# Patient Record
Sex: Female | Born: 1985
Health system: Southern US, Community
[De-identification: ages and names within clinical notes are randomized; demographics above are authoritative.]

## PROBLEM LIST (undated history)

## (undated) DIAGNOSIS — K219 Gastro-esophageal reflux disease without esophagitis: Secondary | ICD-10-CM

## (undated) DIAGNOSIS — S52501A Unspecified fracture of the lower end of right radius, initial encounter for closed fracture: Secondary | ICD-10-CM

## (undated) DIAGNOSIS — E669 Obesity, unspecified: Secondary | ICD-10-CM

## (undated) DIAGNOSIS — D649 Anemia, unspecified: Secondary | ICD-10-CM

## (undated) DIAGNOSIS — S5290XA Unspecified fracture of unspecified forearm, initial encounter for closed fracture: Secondary | ICD-10-CM

## (undated) DIAGNOSIS — Z8679 Personal history of other diseases of the circulatory system: Secondary | ICD-10-CM

## (undated) HISTORY — DX: Obesity, unspecified: E66.9

## (undated) HISTORY — DX: Gastro-esophageal reflux disease without esophagitis: K21.9

## (undated) HISTORY — DX: Anemia, unspecified: D64.9

---
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
## 2006-03-10 ENCOUNTER — Ambulatory Visit (HOSPITAL_COMMUNITY): Admission: RE | Admit: 2006-03-10 | Discharge: 2006-03-10 | Payer: Self-pay | Admitting: Obstetrics & Gynecology

## 2006-03-13 ENCOUNTER — Inpatient Hospital Stay (HOSPITAL_COMMUNITY): Admission: AD | Admit: 2006-03-13 | Discharge: 2006-03-18 | Payer: Self-pay | Admitting: Obstetrics & Gynecology

## 2006-11-06 ENCOUNTER — Ambulatory Visit: Payer: Self-pay | Admitting: Internal Medicine

## 2006-11-06 LAB — CONVERTED CEMR LAB
ALT: 12 units/L (ref 0–40)
AST: 16 units/L (ref 0–37)
Albumin: 3.4 g/dL — ABNORMAL LOW (ref 3.5–5.2)
Alkaline Phosphatase: 48 units/L (ref 39–117)
BUN: 6 mg/dL (ref 6–23)
Basophils Absolute: 0 10*3/uL (ref 0.0–0.1)
Basophils Relative: 0.8 % (ref 0.0–1.0)
CO2: 29 meq/L (ref 19–32)
Calcium: 9 mg/dL (ref 8.4–10.5)
Chloride: 110 meq/L (ref 96–112)
Creatinine, Ser: 0.7 mg/dL (ref 0.4–1.2)
Eosinophil percent: 4.8 % (ref 0.0–5.0)
GFR calc non Af Amer: 115 mL/min
Glomerular Filtration Rate, Af Am: 139 mL/min/{1.73_m2}
Glucose, Bld: 91 mg/dL (ref 70–99)
HCT: 36.3 % (ref 36.0–46.0)
Hemoglobin: 11.8 g/dL — ABNORMAL LOW (ref 12.0–15.0)
Lymphocytes Relative: 48.7 % — ABNORMAL HIGH (ref 12.0–46.0)
MCHC: 32.4 g/dL (ref 30.0–36.0)
MCV: 84.7 fL (ref 78.0–100.0)
Monocytes Absolute: 0.6 10*3/uL (ref 0.2–0.7)
Monocytes Relative: 10.5 % (ref 3.0–11.0)
Neutro Abs: 2.1 10*3/uL (ref 1.4–7.7)
Neutrophils Relative %: 35.2 % — ABNORMAL LOW (ref 43.0–77.0)
Platelets: 237 10*3/uL (ref 150–400)
Potassium: 3.8 meq/L (ref 3.5–5.1)
RBC: 4.28 M/uL (ref 3.87–5.11)
RDW: 13.3 % (ref 11.5–14.6)
Sodium: 141 meq/L (ref 135–145)
TSH: 2.11 microintl units/mL (ref 0.35–5.50)
Total Bilirubin: 0.5 mg/dL (ref 0.3–1.2)
Total Protein: 6.5 g/dL (ref 6.0–8.3)
WBC: 6.1 10*3/uL (ref 4.5–10.5)

## 2006-12-26 ENCOUNTER — Ambulatory Visit: Payer: Self-pay | Admitting: Internal Medicine

## 2007-01-15 ENCOUNTER — Ambulatory Visit: Payer: Self-pay | Admitting: Internal Medicine

## 2007-02-06 ENCOUNTER — Ambulatory Visit: Payer: Self-pay | Admitting: Internal Medicine

## 2007-02-07 ENCOUNTER — Ambulatory Visit: Payer: Self-pay | Admitting: Internal Medicine

## 2007-02-07 ENCOUNTER — Encounter (INDEPENDENT_AMBULATORY_CARE_PROVIDER_SITE_OTHER): Payer: Self-pay | Admitting: *Deleted

## 2007-02-20 ENCOUNTER — Ambulatory Visit: Payer: Self-pay | Admitting: Internal Medicine

## 2007-04-03 ENCOUNTER — Ambulatory Visit: Payer: Self-pay | Admitting: Internal Medicine

## 2007-07-06 ENCOUNTER — Encounter: Payer: Self-pay | Admitting: Internal Medicine

## 2007-07-06 DIAGNOSIS — Z8679 Personal history of other diseases of the circulatory system: Secondary | ICD-10-CM | POA: Insufficient documentation

## 2007-07-06 DIAGNOSIS — K219 Gastro-esophageal reflux disease without esophagitis: Secondary | ICD-10-CM | POA: Insufficient documentation

## 2007-07-09 DIAGNOSIS — F411 Generalized anxiety disorder: Secondary | ICD-10-CM | POA: Insufficient documentation

## 2007-11-29 ENCOUNTER — Ambulatory Visit: Payer: Self-pay | Admitting: Internal Medicine

## 2007-11-29 DIAGNOSIS — L708 Other acne: Secondary | ICD-10-CM | POA: Insufficient documentation

## 2007-11-29 DIAGNOSIS — R21 Rash and other nonspecific skin eruption: Secondary | ICD-10-CM | POA: Insufficient documentation

## 2007-11-30 ENCOUNTER — Telehealth (INDEPENDENT_AMBULATORY_CARE_PROVIDER_SITE_OTHER): Payer: Self-pay | Admitting: *Deleted

## 2007-12-02 DIAGNOSIS — J309 Allergic rhinitis, unspecified: Secondary | ICD-10-CM | POA: Insufficient documentation

## 2007-12-12 ENCOUNTER — Telehealth: Payer: Self-pay | Admitting: Internal Medicine

## 2008-01-08 ENCOUNTER — Ambulatory Visit: Payer: Self-pay | Admitting: Internal Medicine

## 2008-01-10 ENCOUNTER — Ambulatory Visit: Payer: Self-pay | Admitting: Internal Medicine

## 2008-01-16 ENCOUNTER — Ambulatory Visit: Payer: Self-pay | Admitting: Internal Medicine

## 2008-02-08 ENCOUNTER — Ambulatory Visit: Payer: Self-pay | Admitting: Internal Medicine

## 2008-02-29 ENCOUNTER — Telehealth: Payer: Self-pay | Admitting: Internal Medicine

## 2008-04-24 ENCOUNTER — Ambulatory Visit: Payer: Self-pay | Admitting: Internal Medicine

## 2008-04-24 DIAGNOSIS — R5383 Other fatigue: Secondary | ICD-10-CM | POA: Insufficient documentation

## 2008-04-24 DIAGNOSIS — D649 Anemia, unspecified: Secondary | ICD-10-CM | POA: Insufficient documentation

## 2008-08-08 ENCOUNTER — Ambulatory Visit: Payer: Self-pay | Admitting: Internal Medicine

## 2008-08-08 DIAGNOSIS — H109 Unspecified conjunctivitis: Secondary | ICD-10-CM | POA: Insufficient documentation

## 2008-08-13 ENCOUNTER — Telehealth: Payer: Self-pay | Admitting: Internal Medicine

## 2008-08-14 ENCOUNTER — Telehealth: Payer: Self-pay | Admitting: Internal Medicine

## 2008-08-24 ENCOUNTER — Emergency Department (HOSPITAL_COMMUNITY): Admission: EM | Admit: 2008-08-24 | Discharge: 2008-08-24 | Payer: Self-pay | Admitting: Emergency Medicine

## 2008-09-02 ENCOUNTER — Telehealth: Payer: Self-pay | Admitting: Internal Medicine

## 2008-09-15 ENCOUNTER — Encounter: Payer: Self-pay | Admitting: Internal Medicine

## 2008-10-09 ENCOUNTER — Telehealth: Payer: Self-pay | Admitting: Internal Medicine

## 2008-11-04 ENCOUNTER — Telehealth: Payer: Self-pay | Admitting: Internal Medicine

## 2008-11-14 ENCOUNTER — Ambulatory Visit: Payer: Self-pay | Admitting: Internal Medicine

## 2009-02-02 ENCOUNTER — Ambulatory Visit: Payer: Self-pay | Admitting: Internal Medicine

## 2009-02-02 DIAGNOSIS — J069 Acute upper respiratory infection, unspecified: Secondary | ICD-10-CM | POA: Insufficient documentation

## 2009-02-03 ENCOUNTER — Telehealth: Payer: Self-pay | Admitting: Internal Medicine

## 2009-02-03 ENCOUNTER — Encounter: Payer: Self-pay | Admitting: Internal Medicine

## 2009-02-09 ENCOUNTER — Telehealth: Payer: Self-pay | Admitting: Internal Medicine

## 2009-02-10 ENCOUNTER — Telehealth: Payer: Self-pay | Admitting: Internal Medicine

## 2009-02-20 ENCOUNTER — Telehealth: Payer: Self-pay | Admitting: Internal Medicine

## 2009-08-03 ENCOUNTER — Telehealth: Payer: Self-pay | Admitting: Internal Medicine

## 2009-08-04 ENCOUNTER — Telehealth: Payer: Self-pay | Admitting: Internal Medicine

## 2009-08-05 ENCOUNTER — Ambulatory Visit: Payer: Self-pay | Admitting: Internal Medicine

## 2009-08-05 DIAGNOSIS — R1013 Epigastric pain: Secondary | ICD-10-CM | POA: Insufficient documentation

## 2009-08-05 DIAGNOSIS — R110 Nausea: Secondary | ICD-10-CM | POA: Insufficient documentation

## 2009-08-06 LAB — CONVERTED CEMR LAB: Preg, Serum: POSITIVE

## 2009-08-07 ENCOUNTER — Encounter: Payer: Self-pay | Admitting: Internal Medicine

## 2009-08-07 LAB — CONVERTED CEMR LAB
ALT: 11 units/L (ref 0–35)
AST: 19 units/L (ref 0–37)
Albumin: 3.9 g/dL (ref 3.5–5.2)
Alkaline Phosphatase: 45 units/L (ref 39–117)
BUN: 8 mg/dL (ref 6–23)
Basophils Absolute: 0.1 10*3/uL (ref 0.0–0.1)
Basophils Relative: 0.7 % (ref 0.0–3.0)
Bilirubin, Direct: 0.1 mg/dL (ref 0.0–0.3)
CO2: 26 meq/L (ref 19–32)
Calcium: 9.4 mg/dL (ref 8.4–10.5)
Chloride: 105 meq/L (ref 96–112)
Creatinine, Ser: 0.6 mg/dL (ref 0.4–1.2)
Eosinophils Absolute: 0.1 10*3/uL (ref 0.0–0.7)
Eosinophils Relative: 1.6 % (ref 0.0–5.0)
GFR calc non Af Amer: 160.65 mL/min (ref >60)
Glucose, Bld: 90 mg/dL (ref 70–99)
HCT: 27.5 % — ABNORMAL LOW (ref 36.0–46.0)
Hemoglobin, Urine: NEGATIVE
Hemoglobin: 8.6 g/dL — ABNORMAL LOW (ref 12.0–15.0)
Ketones, ur: >=80 mg/dL
Lipase: 9 units/L — ABNORMAL LOW (ref 11.0–59.0)
Lymphocytes Relative: 30 % (ref 12.0–46.0)
Lymphs Abs: 2.4 10*3/uL (ref 0.7–4.0)
MCHC: 31.4 g/dL (ref 30.0–36.0)
MCV: 68.8 fL — ABNORMAL LOW (ref 78.0–100.0)
Monocytes Absolute: 1 10*3/uL (ref 0.1–1.0)
Monocytes Relative: 12.2 % — ABNORMAL HIGH (ref 3.0–12.0)
Neutro Abs: 4.4 10*3/uL (ref 1.4–7.7)
Neutrophils Relative %: 55.5 % (ref 43.0–77.0)
Nitrite: NEGATIVE
Platelets: 379 10*3/uL (ref 150.0–400.0)
Potassium: 3.5 meq/L (ref 3.5–5.1)
RBC: 4 M/uL (ref 3.87–5.11)
RDW: 16.4 % — ABNORMAL HIGH (ref 11.5–14.6)
Sed Rate: 26 mm/hr — ABNORMAL HIGH (ref 0–22)
Sodium: 137 meq/L (ref 135–145)
Specific Gravity, Urine: >=1.03 (ref 1.000–1.030)
TSH: 1.26 microintl units/mL (ref 0.35–5.50)
Total Bilirubin: 0.4 mg/dL (ref 0.3–1.2)
Total Protein: 7.5 g/dL (ref 6.0–8.3)
Urine Glucose: NEGATIVE mg/dL
Urobilinogen, UA: 1 (ref 0.0–1.0)
Vitamin B-12: 252 pg/mL (ref 211–911)
WBC: 8 10*3/uL (ref 4.5–10.5)
pH: 6 (ref 5.0–8.0)

## 2009-09-30 ENCOUNTER — Telehealth: Payer: Self-pay | Admitting: Internal Medicine

## 2009-11-16 ENCOUNTER — Ambulatory Visit: Payer: Self-pay | Admitting: Internal Medicine

## 2009-11-16 DIAGNOSIS — D509 Iron deficiency anemia, unspecified: Secondary | ICD-10-CM | POA: Insufficient documentation

## 2009-11-16 LAB — CONVERTED CEMR LAB: Tissue Transglutaminase Ab, IgA: 1 units (ref <7)

## 2009-11-19 LAB — CONVERTED CEMR LAB
Basophils Absolute: 0.1 10*3/uL (ref 0.0–0.1)
Basophils Relative: 1.7 % (ref 0.0–3.0)
Eosinophils Absolute: 0.2 10*3/uL (ref 0.0–0.7)
Eosinophils Relative: 2.5 % (ref 0.0–5.0)
Ferritin: 2.2 ng/mL — ABNORMAL LOW (ref 10.0–291.0)
HCT: 29.4 % — ABNORMAL LOW (ref 36.0–46.0)
Hemoglobin: 8.9 g/dL — ABNORMAL LOW (ref 12.0–15.0)
IgA: 310 mg/dL (ref 68–378)
Lymphocytes Relative: 41.8 % (ref 12.0–46.0)
Lymphs Abs: 2.7 10*3/uL (ref 0.7–4.0)
MCHC: 30.3 g/dL (ref 30.0–36.0)
MCV: 71 fL — ABNORMAL LOW (ref 78.0–100.0)
Monocytes Absolute: 0.5 10*3/uL (ref 0.1–1.0)
Monocytes Relative: 7.8 % (ref 3.0–12.0)
Neutro Abs: 2.9 10*3/uL (ref 1.4–7.7)
Neutrophils Relative %: 46.2 % (ref 43.0–77.0)
Platelets: 394 10*3/uL (ref 150.0–400.0)
RBC: 4.13 M/uL (ref 3.87–5.11)
RDW: 16.3 % — ABNORMAL HIGH (ref 11.5–14.6)
WBC: 6.4 10*3/uL (ref 4.5–10.5)

## 2010-02-08 ENCOUNTER — Telehealth: Payer: Self-pay | Admitting: Internal Medicine

## 2010-02-11 ENCOUNTER — Ambulatory Visit: Payer: Self-pay | Admitting: Internal Medicine

## 2010-02-15 ENCOUNTER — Encounter: Payer: Self-pay | Admitting: Internal Medicine

## 2010-02-15 ENCOUNTER — Telehealth: Payer: Self-pay | Admitting: Internal Medicine

## 2010-02-17 ENCOUNTER — Telehealth: Payer: Self-pay | Admitting: Internal Medicine

## 2010-02-25 ENCOUNTER — Telehealth: Payer: Self-pay | Admitting: Internal Medicine

## 2010-09-10 ENCOUNTER — Ambulatory Visit: Payer: Self-pay | Admitting: Internal Medicine

## 2010-09-10 DIAGNOSIS — L659 Nonscarring hair loss, unspecified: Secondary | ICD-10-CM | POA: Insufficient documentation

## 2010-09-10 DIAGNOSIS — E559 Vitamin D deficiency, unspecified: Secondary | ICD-10-CM | POA: Insufficient documentation

## 2010-09-15 LAB — CONVERTED CEMR LAB
BUN: 9 mg/dL (ref 6–23)
Basophils Absolute: 0.1 10*3/uL (ref 0.0–0.1)
Basophils Relative: 1 % (ref 0.0–3.0)
Bilirubin Urine: NEGATIVE
CO2: 28 meq/L (ref 19–32)
Calcium: 9 mg/dL (ref 8.4–10.5)
Chloride: 105 meq/L (ref 96–112)
Creatinine, Ser: 0.6 mg/dL (ref 0.4–1.2)
Eosinophils Absolute: 0.3 10*3/uL (ref 0.0–0.7)
Eosinophils Relative: 3.1 % (ref 0.0–5.0)
GFR calc non Af Amer: 175.88 mL/min (ref >60)
Glucose, Bld: 87 mg/dL (ref 70–99)
HCT: 32.5 % — ABNORMAL LOW (ref 36.0–46.0)
Hemoglobin, Urine: NEGATIVE
Hemoglobin: 10.6 g/dL — ABNORMAL LOW (ref 12.0–15.0)
Iron: 18 ug/dL — ABNORMAL LOW (ref 42–145)
Ketones, ur: NEGATIVE mg/dL
Leukocytes, UA: NEGATIVE
Lymphocytes Relative: 37.4 % (ref 12.0–46.0)
Lymphs Abs: 3.1 10*3/uL (ref 0.7–4.0)
MCHC: 32.5 g/dL (ref 30.0–36.0)
MCV: 81 fL (ref 78.0–100.0)
Monocytes Absolute: 0.7 10*3/uL (ref 0.1–1.0)
Monocytes Relative: 8.9 % (ref 3.0–12.0)
Neutro Abs: 4 10*3/uL (ref 1.4–7.7)
Neutrophils Relative %: 49.6 % (ref 43.0–77.0)
Nitrite: NEGATIVE
Platelets: 345 10*3/uL (ref 150.0–400.0)
Potassium: 3.8 meq/L (ref 3.5–5.1)
RBC: 4.01 M/uL (ref 3.87–5.11)
RDW: 16.3 % — ABNORMAL HIGH (ref 11.5–14.6)
Saturation Ratios: 3.8 % — ABNORMAL LOW (ref 20.0–50.0)
Sed Rate: 13 mm/hr (ref 0–22)
Sodium: 139 meq/L (ref 135–145)
Specific Gravity, Urine: 1.02 (ref 1.000–1.030)
TSH: 0.98 microintl units/mL (ref 0.35–5.50)
Total Protein, Urine: NEGATIVE mg/dL
Transferrin: 342.4 mg/dL (ref 212.0–360.0)
Urine Glucose: NEGATIVE mg/dL
Urobilinogen, UA: 0.2 (ref 0.0–1.0)
Vit D, 25-Hydroxy: 19 ng/mL — ABNORMAL LOW (ref 30–89)
Vitamin B-12: 444 pg/mL (ref 211–911)
WBC: 8.1 10*3/uL (ref 4.5–10.5)
pH: 5.5 (ref 5.0–8.0)

## 2010-12-07 ENCOUNTER — Ambulatory Visit: Admit: 2010-12-07 | Payer: Self-pay | Admitting: Internal Medicine

## 2010-12-07 NOTE — Assessment & Plan Note (Signed)
Summary: GERD, anemia    History of Present Illness Visit Type: follow up  Primary GI MD: Stan Head MD [facility] Primary Provider: Sula Soda, MD  Requesting Provider: n/a Chief Complaint: Acid reflux, heartburn, nausea, and bloating  History of Present Illness:   This 25 year old African American woman returns because of nausea, bloating and some epigastric discomfort. She was last seen in April 2008. At that time she had been doing well on AcipHex for GERD. EGD 4/08 with mild endoscopic GERD (biopsies).  This Fall she was seen by primary care and was having more nausea and indigestion symptoms. She thought that AcipHex had stopped working. Carafate was tried and was not helpful. Over the counter histamine blockers were somewhat helpful. She was also pregnant and subsequently had an abortion. The nausea and these symptoms have persisted. She is quite fatigued and has been anemic for some time and has had menorrhagia ever since she began menstrual periods. She has seen Dr. Clearance Coots of gynecology, and since starting birth control pills periods are less heavy.   GI Review of Systems    Reports acid reflux, bloating, heartburn, and  nausea.      Denies abdominal pain, belching, chest pain, dysphagia with liquids, dysphagia with solids, loss of appetite, vomiting, vomiting blood, weight loss, and  weight gain.        Denies anal fissure, black tarry stools, change in bowel habit, constipation, diarrhea, diverticulosis, fecal incontinence, heme positive stool, hemorrhoids, irritable bowel syndrome, jaundice, light color stool, liver problems, rectal bleeding, and  rectal pain.    Current Medications (verified): 1)  Loratadine 10 Mg  Tabs (Loratadine) .... Once Daily As Needed Allergies 2)  Nasacort Aq 55 Mcg/act Aers (Triamcinolone Acetonide(Nasal)) .Marland Kitchen.. 1-2 Spr Once Daily 3)  Promethazine Hcl 25 Mg  Tabs (Promethazine Hcl) .Marland Kitchen.. 1 By Mouth Q 6 H As Needed Nausea 4)  Loestrin 24 Fe 1-20 Mg-Mcg  Tabs (Norethin Ace-Eth Estrad-Fe) .... As Directed  Allergies (verified): No Known Drug Allergies  Past History:  Past Medical History: GERD (EGD/bx 4/08) Allergic rhinitis Anemia-Iron deficiency menorrhagia  Past Surgical History: Caesarean section 2007  abortion 2010  Family History: Family History Hypertension No FH of Colon Cancer:  Social History: Single, has 1 child 2007 Never Smoked Wants to go to a Pharmacy school, in college now Alcohol Use - no Daily Caffeine Use: 5 glasses a day  Illicit Drug Use - no Drug Use:  no  Review of Systems       per HPI  Vital Signs:  Patient profile:   25 year old female Height:      60 inches Weight:      119 pounds BMI:     23.32 BSA:     1.50 Pulse rate:   104 / minute Pulse rhythm:   regular BP sitting:   112 / 64  (left arm) Cuff size:   regular  Vitals Entered By: Ok Anis CMA (November 16, 2009 11:41 AM)  Physical Exam  General:  NAD Eyes:  anicteric Lungs:  Clear throughout to auscultation. Heart:  Regular rate and rhythm; no murmurs, rubs,  or bruits. Abdomen:  soft and non-tender without HSM or mass BS+   Impression & Recommendations:  Problem # 1:  GERD (ICD-530.81) Assessment Deteriorated Her symptoms are similar to what she had in past. Since aciphex was not helpful anymore will try a diferent PPI. Omeprazole 20 mg daily.  Problem # 2:  ANEMIA, IRON DEFICIENCY, MICROCYTIC (ICD-280.8) Assessment: Unchanged Likely  from chronic menorrhagia. She has not been compliant with iron therapy due to mild constipation but says it is not too much of a problem. ? if she could have celiac disease given bloating complaints. will check labs as nbelow. restart Prenatal vitamins. Consider alternative by mouth iron if available through Medicaid. Orders: T-Tissue Transglutamase Ab IgA [id number]) TLB-CBC Platelet - w/Differential (85025-CBCD) TLB-Ferritin (82728-FER) TLB-IgA (Immunoglobulin A)  (82784-IGA)  Patient Instructions: 1)  Please go to the basement to have your lab tests drawn today.  2)  We will call you with results. 3)  Please pick up your medications at your pharmacy.  4)  Please restart your prenatal vitamin due to iron deficiency. 5)  Copy sent to : Sonda Primes, MD 6)  The medication list was reviewed and reconciled.  All changed / newly prescribed medications were explained.  A complete medication list was provided to the patient / caregiver. Prescriptions: OMEPRAZOLE 20 MG  CPDR (OMEPRAZOLE) 1 each day 30 minutes before meal  #30 x 11   Entered and Authorized by:   Iva Boop MD, [facility]   Signed by:   Iva Boop MD, FACG on 11/16/2009   Method used:   [facility] [address] [phone]* (retail)       [address]       [place], [place]  [address]       Ph: [phone] or [phone]       Fax: [phone]   RxID:   [id number]  cc: Coral Ceo also

## 2010-12-07 NOTE — Medication Information (Signed)
Summary: Prior [place] for Azelastine/[place] Medicaid  Prior [place] for Azelastine/[place] Medicaid   Imported By: Sherian Rein 02/23/2010 14:35:17  _____________________________________________________________________  External Attachment:    Type:   Image     Comment:   External Document

## 2010-12-07 NOTE — Progress Notes (Signed)
Summary: ALLERGIES  Phone Note Call from Patient Call back at Home Phone [phone]   Summary of Call: Patient is requesting prescription for allergys including one for nasal spray.  Initial call taken by: Lamar Sprinkles, CMA,  February 08, 2010 1:09 PM  Follow-up for Phone Call        OK Loratidine and Flonase OV if ill Follow-up by: Tresa Garter MD,  February 08, 2010 5:05 PM  Additional Follow-up for Phone Call Additional follow up Details #1::        Pt informed  Additional Follow-up by: Lamar Sprinkles, CMA,  February 08, 2010 5:34 PM    New/Updated Medications: LORATADINE 10 MG  TABS (LORATADINE) once daily as needed allergies FLONASE 50 MCG/ACT SUSP (FLUTICASONE PROPIONATE) 1 spr each nostr qd as needed Prescriptions: FLONASE 50 MCG/ACT SUSP (FLUTICASONE PROPIONATE) 1 spr each nostr qd as needed  #1 x 6   Entered and Authorized by:   Tresa Garter MD   Signed by:   Lamar Sprinkles, CMA on 02/08/2010   Method used:   Electronically to        [facility] [address] [phone]* (retail)       [address]       [place], [place]  [address]       Ph: [phone] or [phone]       Fax: [phone]   RxID:   [id number] LORATADINE 10 MG  TABS (LORATADINE) once daily as needed allergies  #30 x 12   Entered and Authorized by:   Tresa Garter MD   Signed by:   Lamar Sprinkles, CMA on 02/08/2010   Method used:   Electronically to        [facility] [address] [phone]* (retail)       [address]       [place], [place]  [address]       Ph: [phone] or [phone]       Fax: [phone]   RxID:   [id number]

## 2010-12-07 NOTE — Assessment & Plan Note (Signed)
Summary: ALLERGIES/NWS   Vital Signs:  Patient profile:   25 year old female Height:      60 inches (152.40 cm) Weight:      120.4 pounds (54.73 kg) O2 Sat:      94 % on Room air Temp:     97.0 degrees F (36.11 degrees C) oral Pulse rate:   87 / minute BP sitting:   102 / 62  (left arm) Cuff size:   regular  Vitals Entered By: Orlan Leavens (February 11, 2010 11:18 AM)  O2 Flow:  Room air CC: ALLERGIES Is Patient Diabetic? No Pain Assessment Patient in pain? no        Primary Care Provider:  Sula Soda, MD   CC:  ALLERGIES.  History of Present Illness: C/o allergies; eyes itching  Current Medications (verified): 1)  Loratadine 10 Mg  Tabs (Loratadine) .... Once Daily As Needed Allergies 2)  Nasacort Aq 55 Mcg/act Aers (Triamcinolone Acetonide(Nasal)) .Marland Kitchen.. 1-2 Spr Once Daily 3)  Promethazine Hcl 25 Mg  Tabs (Promethazine Hcl) .Marland Kitchen.. 1 By Mouth Q 6 H As Needed Nausea 4)  Loestrin 24 Fe 1-20 Mg-Mcg Tabs (Norethin Ace-Eth Estrad-Fe) .... As Directed 5)  Omeprazole 20 Mg  Cpdr (Omeprazole) .Marland Kitchen.. 1 Each Day 30 Minutes Before Meal 6)  Flonase 50 Mcg/act Susp (Fluticasone Propionate) .Marland Kitchen.. 1 Spr Each Nostr Qd As Needed  Allergies (verified): No Known Drug Allergies  Past History:  Past Medical History: Last updated: 11/16/2009 GERD (EGD/bx 4/08) Allergic rhinitis Anemia-Iron deficiency menorrhagia  Social History: Last updated: 11/16/2009 Single, has 1 child 2007 Never Smoked Wants to go to a Pharmacy school, in college now Alcohol Use - no Daily Caffeine Use: 5 glasses a day  Illicit Drug Use - no  Physical Exam  General:  NAD Eyes:  No corneal or conjunctival inflammation noted. EOMI. Perrla.  Nose:  External nasal examination shows no deformity or inflammation. Nasal mucosa are pink and moist without lesions or exudates. Mouth:  Moist Lungs:  CTA Heart:  RRR Abdomen:  WNL   Impression & Recommendations:  Problem # 1:  ALLERGIC RHINITIS  (ICD-477.9) Assessment Deteriorated  Her updated medication list for this problem includes:    Loratadine 10 Mg Tabs (Loratadine) ..... Once daily as needed allergies    Nasacort Aq 55 Mcg/act Aers (Triamcinolone acetonide(nasal)) .Marland Kitchen... 1-2 spr once daily    Promethazine Hcl 25 Mg Tabs (Promethazine hcl) .Marland Kitchen... 1 by mouth q 6 h as needed nausea    Flonase 50 Mcg/act Susp (Fluticasone propionate) .Marland Kitchen... 1 spr each nostr qd as needed  Problem # 2:  CONJUNCTIVITIS (ICD-372.30) - allergic Assessment: Comment Only Optivar as needed gtt   Complete Medication List: 1)  Loratadine 10 Mg Tabs (Loratadine) .... Once daily as needed allergies 2)  Nasacort Aq 55 Mcg/act Aers (Triamcinolone acetonide(nasal)) .Marland Kitchen.. 1-2 spr once daily 3)  Promethazine Hcl 25 Mg Tabs (Promethazine hcl) .Marland Kitchen.. 1 by mouth q 6 h as needed nausea 4)  Loestrin 24 Fe 1-20 Mg-mcg Tabs (Norethin ace-eth estrad-fe) .... As directed 5)  Omeprazole 20 Mg Cpdr (Omeprazole) .Marland Kitchen.. 1 each day 30 minutes before meal 6)  Flonase 50 Mcg/act Susp (Fluticasone propionate) .Marland Kitchen.. 1 spr each nostr qd as needed 7)  Optivar 0.05 % Soln (Azelastine hcl) .Marland Kitchen.. 1 gtt each eye two times a day for allergies  Patient Instructions: 1)  Use the Sinus rinse as needed Prescriptions: OPTIVAR 0.05 % SOLN (AZELASTINE HCL) 1 gtt each eye two times a day  for allergies  #1 x 6   Entered and Authorized by:   Tresa Garter MD   Signed by:   Tresa Garter MD on 02/11/2010   Method used:   Electronically to        [facility] [address] [phone]* (retail)       [address]       [place], [place]  [address]       Ph: [phone] or [phone]       Fax: [phone]   RxID:   [id number]

## 2010-12-07 NOTE — Progress Notes (Signed)
Summary: optivar  Phone Note Call from Patient Call back at Home Phone [phone]   Summary of Call: Patient and her mom called to check on status of Optivar. They are aware that we are waiting on reply from insurance company. Initial call taken by: Lucious Groves,  February 17, 2010 11:54 AM     Appended Document: optivar I spoke with insurance company and patient must try an fail both Pataday and Paranol. Please advise.  Appended Document: optivar Pharmacy sent fax, patient must try preferred products Paraday and Patanol. Please advise.

## 2010-12-07 NOTE — Assessment & Plan Note (Signed)
Summary: CONGESTION COUGH-D/T---STC   Vital Signs:  Patient profile:   25 year old female Height:      60 inches Weight:      123 pounds BMI:     24.11 Temp:     98.5 degrees F oral Pulse rate:   80 / minute Pulse rhythm:   regular Resp:     16 per minute BP sitting:   110 / 74  (left arm) Cuff size:   regular  Vitals Entered By: Lanier Prude, CMA(AAMA) (September 10, 2010 4:16 PM) CC: cough/congestion & hair falling out Is Patient Diabetic? No   Primary Care Provider:  Sula Soda, MD   CC:  cough/congestion & hair falling out.  History of Present Illness: C/o hair loss x 12 months - started after she started to use a hair relaxer. F/u anemia  Current Medications (verified): 1)  Loratadine 10 Mg  Tabs (Loratadine) .... Once Daily As Needed Allergies 2)  Nasacort Aq 55 Mcg/act Aers (Triamcinolone Acetonide(Nasal)) .Marland Kitchen.. 1-2 Spr Once Daily 3)  Promethazine Hcl 25 Mg  Tabs (Promethazine Hcl) .Marland Kitchen.. 1 By Mouth Q 6 H As Needed Nausea 4)  Loestrin 24 Fe 1-20 Mg-Mcg Tabs (Norethin Ace-Eth Estrad-Fe) .... As Directed 5)  Omeprazole 20 Mg  Cpdr (Omeprazole) .Marland Kitchen.. 1 Each Day 30 Minutes Before Meal 6)  Flonase 50 Mcg/act Susp (Fluticasone Propionate) .Marland Kitchen.. 1 Spr Each Nostr Qd As Needed 7)  Patanol 0.1 % Soln (Olopatadine Hcl) .Marland Kitchen.. 1 Gtt Each Eye Two Times A Day For Allergies  Allergies (verified): No Known Drug Allergies  Past History:  Social History: Last updated: 11/16/2009 Single, has 1 child 2007 Never Smoked Wants to go to a Pharmacy school, in college now Alcohol Use - no Daily Caffeine Use: 5 glasses a day  Illicit Drug Use - no  Past Medical History: GERD (EGD/bx 4/08) Allergic rhinitis Anemia-Iron deficiency menorrhagia Vit D def 2011  Family History: Reviewed history from 11/16/2009 and no changes required. Family History Hypertension No FH of Colon Cancer:  Review of Systems  The patient denies anorexia, fever, weight loss, weight gain, chest pain,  abdominal pain, and severe indigestion/heartburn.    Physical Exam  General:  NAD Head:  Normocephalic and atraumatic without obvious abnormalities. No apparent alopecia or balding. Nose:  External nasal examination shows no deformity or inflammation. Nasal mucosa are pink and moist without lesions or exudates. Mouth:  Moist Neck:  No deformities, masses, or tenderness noted. Lungs:  CTA Heart:  RRR Abdomen:  WNL Msk:  No deformity or scoliosis noted of thoracic or lumbar spine.   Neurologic:  No cranial nerve deficits noted. Station and gait are normal. Plantar reflexes are down-going bilaterally. DTRs are symmetrical throughout. Sensory, motor and coordinative functions appear intact. Skin:  Clear. Temples with some hair receeding Psych:  Cognition and judgment appear intact. Alert and cooperative with normal attention span and concentration. No apparent delusions, illusions, hallucinations    Impression & Recommendations:  Problem # 1:  ALOPECIA (ICD-704.00) - unclear etiology Assessment New  Orders: TLB-B12, Serum-Total ONLY (16109-U04) TLB-BMP (Basic Metabolic Panel-BMET) (80048-METABOL) TLB-CBC Platelet - w/Differential (85025-CBCD) TLB-IBC Pnl (Iron/FE;Transferrin) (83550-IBC) TLB-Sedimentation Rate (ESR) (85652-ESR) TLB-TSH (Thyroid Stimulating Hormone) (84443-TSH) T-Vitamin D (25-Hydroxy) (54098-11914) TLB-Udip ONLY (81003-UDIP)  Problem # 2:  VITAMIN D DEFICIENCY (ICD-268.9) Assessment: New Start Vit D2 50 000 iu weekly for 6 weeks; when finished, start Vit D3 1000 iu daily.   Problem # 3:  ANEMIA, IRON DEFICIENCY, MICROCYTIC (ICD-280.8) Assessment: Comment Only  Orders: TLB-B12, Serum-Total ONLY (16109-U04) TLB-BMP (Basic Metabolic Panel-BMET) (80048-METABOL) TLB-CBC Platelet - w/Differential (85025-CBCD) TLB-IBC Pnl (Iron/FE;Transferrin) (83550-IBC) TLB-Sedimentation Rate (ESR) (85652-ESR) TLB-TSH (Thyroid Stimulating Hormone) (84443-TSH) T-Vitamin D  (25-Hydroxy) (54098-11914) TLB-Udip ONLY (81003-UDIP)  Her updated medication list for this problem includes:    Ferrex 150 Forte 150-25-1 Mg-mcg-mg Caps (Iron polysacch cmplx-b12-fa) .Marland Kitchen... 1 by mouth qd  Problem # 4:  FATIGUE (ICD-780.79) Assessment: Unchanged  Complete Medication List: 1)  Loestrin 24 Fe 1-20 Mg-mcg Tabs (Norethin ace-eth estrad-fe) .... As directed 2)  Vitamin D 1000 Unit Tabs (Cholecalciferol) .Marland Kitchen.. 1 by mouth qd 3)  Vitamin D (ergocalciferol) 50000 Unit Caps (Ergocalciferol) .Marland Kitchen.. 1 by mouth q 1 week x 6 weeks then start vit d 1000 international units qd 4)  Ferrex 150 Forte 150-25-1 Mg-mcg-mg Caps (Iron polysacch cmplx-b12-fa) .Marland Kitchen.. 1 by mouth qd  Patient Instructions: 1)  Please schedule a follow-up appointment in 1 month. Prescriptions: FERREX 150 FORTE 150-25-1 MG-MCG-MG CAPS (IRON POLYSACCH CMPLX-B12-FA) 1 by mouth qd  #30 x 6   Entered and Authorized by:   Tresa Garter MD   Signed by:   Tresa Garter MD on 09/15/2010   Method used:   Electronically to        [facility] [address] [phone]* (retail)       [address]       [place], [place]  [address]       Ph: [phone] or [phone]       Fax: [phone]   RxID:   [id number] VITAMIN D (ERGOCALCIFEROL) 50000 UNIT CAPS (ERGOCALCIFEROL) 1 by mouth q 1 week x 6 weeks then start Vit D 1000 international units qd  #6 x 0   Entered and Authorized by:   Tresa Garter MD   Signed by:   Tresa Garter MD on 09/15/2010   Method used:   Electronically to        [facility]  [facility] [address] [phone]* (retail)       [address]       [place], [place]  [address]       Ph: [phone] or [phone]       Fax: [phone]   RxID:   [id number]    Orders Added: 1)  TLB-B12, Serum-Total ONLY [82607-B12] 2)  TLB-BMP (Basic Metabolic Panel-BMET) [80048-METABOL] 3)  TLB-CBC Platelet - w/Differential [85025-CBCD] 4)  TLB-IBC Pnl  (Iron/FE;Transferrin) [83550-IBC] 5)  TLB-Sedimentation Rate (ESR) [85652-ESR] 6)  TLB-TSH (Thyroid Stimulating Hormone) [84443-TSH] 7)  T-Vitamin D (25-Hydroxy) [60737-10626] 8)  TLB-Udip ONLY [81003-UDIP] 9)  Est. Patient Level IV [94854]

## 2010-12-07 NOTE — Progress Notes (Signed)
Summary: EYE DROPS  Phone Note From Other Clinic   Summary of Call: Optivar is not covered. Pataday and patanol are preferred alternatives. Please advise.  Initial call taken by: Lamar Sprinkles, CMA,  February 25, 2010 3:46 PM  Follow-up for Phone Call        OK Patanol Follow-up by: Tresa Garter MD,  March 01, 2010 1:24 PM    New/Updated Medications: PATANOL 0.1 % SOLN (OLOPATADINE HCL) 1 gtt each eye two times a day for allergies Prescriptions: PATANOL 0.1 % SOLN (OLOPATADINE HCL) 1 gtt each eye two times a day for allergies  #1 x 4   Entered and Authorized by:   Tresa Garter MD   Signed by:   Lamar Sprinkles, CMA on 03/01/2010   Method used:   Electronically to        [facility] [address] [phone]* (retail)       [address]       [place], [place]  [address]       Ph: [phone] or [phone]       Fax: [phone]   RxID:   [id number]

## 2010-12-07 NOTE — Progress Notes (Signed)
Summary: optivar pa  Phone Note From Pharmacy   Summary of Call: PA request--Generic Optivar. Printed form from [email or web address]  Form completed and pending approval. Initial call taken by: Lucious Groves,  February 15, 2010 10:07 AM

## 2010-12-13 ENCOUNTER — Ambulatory Visit: Payer: Self-pay | Admitting: Internal Medicine

## 2010-12-17 ENCOUNTER — Encounter: Payer: Self-pay | Admitting: Internal Medicine

## 2010-12-17 ENCOUNTER — Other Ambulatory Visit: Payer: PRIVATE HEALTH INSURANCE

## 2010-12-17 ENCOUNTER — Other Ambulatory Visit: Payer: Self-pay | Admitting: Internal Medicine

## 2010-12-17 ENCOUNTER — Ambulatory Visit (INDEPENDENT_AMBULATORY_CARE_PROVIDER_SITE_OTHER): Payer: PRIVATE HEALTH INSURANCE | Admitting: Internal Medicine

## 2010-12-17 DIAGNOSIS — K5900 Constipation, unspecified: Secondary | ICD-10-CM | POA: Insufficient documentation

## 2010-12-17 DIAGNOSIS — D508 Other iron deficiency anemias: Secondary | ICD-10-CM

## 2010-12-17 DIAGNOSIS — L659 Nonscarring hair loss, unspecified: Secondary | ICD-10-CM

## 2010-12-17 DIAGNOSIS — E559 Vitamin D deficiency, unspecified: Secondary | ICD-10-CM

## 2010-12-17 DIAGNOSIS — K5909 Other constipation: Secondary | ICD-10-CM

## 2010-12-17 DIAGNOSIS — R110 Nausea: Secondary | ICD-10-CM

## 2010-12-17 LAB — CBC WITH DIFFERENTIAL/PLATELET
Eosinophils Relative: 3 % (ref 0.0–5.0)
HCT: 31 % — ABNORMAL LOW (ref 36.0–46.0)
Lymphs Abs: 3.2 10*3/uL (ref 0.7–4.0)
MCV: 80.1 fl (ref 78.0–100.0)
Monocytes Absolute: 0.6 10*3/uL (ref 0.1–1.0)
Platelets: 323 10*3/uL (ref 150.0–400.0)
RDW: 16.6 % — ABNORMAL HIGH (ref 11.5–14.6)
WBC: 8.1 10*3/uL (ref 4.5–10.5)

## 2010-12-17 LAB — IBC PANEL: Iron: 11 ug/dL — ABNORMAL LOW (ref 42–145)

## 2010-12-22 ENCOUNTER — Telehealth: Payer: Self-pay | Admitting: Internal Medicine

## 2010-12-29 NOTE — Assessment & Plan Note (Signed)
Summary: 3 MOS F/U/NWS   Vital Signs:  Patient profile:   25 year old female Height:      60 inches Weight:      133 pounds BMI:     26.07 Temp:     99.4 degrees F oral Pulse rate:   88 / minute Pulse rhythm:   regular Resp:     16 per minute BP sitting:   118 / 80  (left arm) Cuff size:   regular  Vitals Entered By: Lanier Prude, CMA(AAMA) (December 17, 2010 3:58 PM) CC: 3 mo f/u  Is Patient Diabetic? No   Primary Care Provider:  Sula Soda, MD   CC:  3 mo f/u .  History of Present Illness: C/o  nausea, GERD (Zantac)  F/u anemia, low vit D   Current Medications (verified): 1)  Loestrin 24 Fe 1-20 Mg-Mcg Tabs (Norethin Ace-Eth Estrad-Fe) .... As Directed 2)  Vitamin D 1000 Unit Tabs (Cholecalciferol) .Marland Kitchen.. 1 By Mouth Qd 3)  Ferrex 150 Forte 150-25-1 Mg-Mcg-Mg Caps (Iron Polysacch Cmplx-B12-Fa) .Marland Kitchen.. 1 By Mouth Qd  Allergies (verified): No Known Drug Allergies  Past History:  Past Medical History: Last updated: 09/10/2010 GERD (EGD/bx 4/08) Allergic rhinitis Anemia-Iron deficiency menorrhagia Vit D def 2011  Past Surgical History: Last updated: 11/16/2009 Caesarean section 2007  abortion 2010  Social History: Last updated: 11/16/2009 Single, has 1 child 2007 Never Smoked Wants to go to a Pharmacy school, in college now Alcohol Use - no Daily Caffeine Use: 5 glasses a day  Illicit Drug Use - no  Review of Systems  The patient denies fever, chest pain, abdominal pain, melena, difficulty walking, and unusual weight change.         stools qd  Physical Exam  General:  NAD Eyes:  No corneal or conjunctival inflammation noted. EOMI. Perrla.  Mouth:  Moist Neck:  No deformities, masses, or tenderness noted. Lungs:  CTA Heart:  RRR Abdomen:  WNL Msk:  No deformity or scoliosis noted of thoracic or lumbar spine.   Neurologic:  No cranial nerve deficits noted. Station and gait are normal. Plantar reflexes are down-going bilaterally. DTRs are  symmetrical throughout. Sensory, motor and coordinative functions appear intact. Skin:  Clear. Temples with some hair receeding Psych:  Cognition and judgment appear intact. Alert and cooperative with normal attention span and concentration. No apparent delusions, illusions, hallucinations   Impression & Recommendations:  Problem # 1:  VITAMIN D DEFICIENCY (ICD-268.9) Assessment Deteriorated  Compliance encouraged.  Orders: T-Vitamin D (25-Hydroxy) [phone]) TLB-CBC Platelet - w/Differential (85025-CBCD) TLB-IBC Pnl (Iron/FE;Transferrin) (83550-IBC)  Problem # 2:  ALOPECIA (ICD-704.00) Assessment: Improved  Problem # 3:  CONSTIPATION, CHRONIC (ICD-564.09) Assessment: Deteriorated Try Amitiza Her updated medication list for this problem includes:    Senokot S 8.6-50 Mg Tabs (Sennosides-docusate sodium) .Marland Kitchen... 2 by mouth qd  Problem # 4:  NAUSEA ALONE (ICD-787.02) ?? due to BCP vs other Assessment: Deteriorated Consider IUD  Complete Medication List: 1)  Loestrin 24 Fe 1-20 Mg-mcg Tabs (Norethin ace-eth estrad-fe) .... As directed 2)  Vitamin D 1000 Unit Tabs (Cholecalciferol) .Marland Kitchen.. 1 by mouth qd 3)  Ferrex 150 Forte 150-25-1 Mg-mcg-mg Caps (Iron polysacch cmplx-b12-fa) .Marland Kitchen.. 1 by mouth qd 4)  Amitiza 24 Mcg Caps (Lubiprostone) .Marland Kitchen.. 1or 2  by mouth once daily as needed constipation 5)  Senokot S 8.6-50 Mg Tabs (Sennosides-docusate sodium) .... 2 by mouth qd 6)  Vitamin D (ergocalciferol) 50000 Unit Caps (Ergocalciferol) .Marland Kitchen.. 1 by mouth q 1 week x 6  weeks then start vit d 1000 international units qd  Patient Instructions: 1)  Senokot S try 2 a day 2)  Consider IUD 3)  Please schedule a follow-up appointment in 4 months. Prescriptions: VITAMIN D (ERGOCALCIFEROL) 50000 UNIT CAPS (ERGOCALCIFEROL) 1 by mouth q 1 week x 6 weeks then start Vit D 1000 international units qd  #6 x 0   Entered and Authorized by:   Tresa Garter MD   Signed by:   Tresa Garter MD on  12/19/2010   Method used:   Electronically to        [facility] [address] [phone]* (retail)       [address]       [place], [place]  [address]       Ph: [phone] or [phone]       Fax: [phone]   RxID:   [id number] AMITIZA 24 MCG CAPS (LUBIPROSTONE) 1or 2  by mouth once daily as needed constipation  #60 x 6   Entered and Authorized by:   Tresa Garter MD   Signed by:   Tresa Garter MD on 12/19/2010   Method used:   Print then Give to Patient   RxID:   [id number]    Orders Added: 1)  T-Vitamin D (25-Hydroxy) [87564-33295] 2)  TLB-CBC Platelet - w/Differential [85025-CBCD] 3)  TLB-IBC Pnl (Iron/FE;Transferrin) [83550-IBC] 4)  Est. Patient Level IV [18841]

## 2010-12-29 NOTE — Progress Notes (Signed)
Summary: lab results  Phone Note Call from Patient Call back at Home Phone [phone]   Caller: Patient---[phone] Call For: Dr Posey Rea Summary of Call: Pt left a message on triage A, pt states she is returning a call from a nurse? Initial call taken by: Verdell Face,  December 22, 2010 10:16 AM  Follow-up for Phone Call        number above busy. Trying to return call about pt's lab results (on Stacey's desktop) Follow-up by: Brenton Grills CMA Duncan Dull),  December 22, 2010 11:50 AM  Additional Follow-up for Phone Call Additional follow up Details #1::        Returned call to [phone] (#left by patient)///lmovm to call back  Patient notified and will pick up rx per MD..Marland KitchenAlvy Beal Archie CMA  December 23, 2010 9:51 AM

## 2011-01-27 ENCOUNTER — Encounter: Payer: Self-pay | Admitting: Internal Medicine

## 2011-01-27 ENCOUNTER — Ambulatory Visit (INDEPENDENT_AMBULATORY_CARE_PROVIDER_SITE_OTHER): Payer: PRIVATE HEALTH INSURANCE | Admitting: Internal Medicine

## 2011-01-27 DIAGNOSIS — J309 Allergic rhinitis, unspecified: Secondary | ICD-10-CM

## 2011-01-27 DIAGNOSIS — D508 Other iron deficiency anemias: Secondary | ICD-10-CM

## 2011-01-27 DIAGNOSIS — K5909 Other constipation: Secondary | ICD-10-CM

## 2011-01-27 DIAGNOSIS — R21 Rash and other nonspecific skin eruption: Secondary | ICD-10-CM

## 2011-01-27 MED ORDER — DOCUSATE SODIUM 100 MG PO CAPS: 100.0000 mg | ORAL_CAPSULE | Freq: Two times a day (BID) | ORAL | Status: DC

## 2011-01-27 MED ORDER — FLUTICASONE PROPIONATE 50 MCG/ACT NA SUSP: 1.0000 | Freq: Every day | NASAL | Status: DC

## 2011-01-27 MED ORDER — FEXOFENADINE HCL 180 MG PO TABS: 180.0000 mg | ORAL_TABLET | Freq: Every day | ORAL | Status: DC

## 2011-01-27 MED ORDER — IRON 325 (65 FE) MG PO TABS
1.0000 | ORAL_TABLET | Freq: Every day | ORAL | Status: DC
Start: 2011-01-27 — End: 2011-07-22

## 2011-01-27 NOTE — Assessment & Plan Note (Signed)
Facial rash c/w postinflammatory hyperpigmentation - to refer to derm for further eval and tx

## 2011-01-27 NOTE — Assessment & Plan Note (Signed)
D/w pt - to change to OTC iron sulfate once daily with f/u with PCP for labs in 3-6 months

## 2011-01-27 NOTE — Assessment & Plan Note (Signed)
Mild to moderate prob seasonal - treatment as below, f/u any worsening symptoms

## 2011-01-27 NOTE — Assessment & Plan Note (Signed)
Overall stable, ok to d/c the senakot s, Continue all other medications as before , add colace bid for stool softner as this seems to be her primary issue today

## 2011-01-27 NOTE — Patient Instructions (Signed)
OK to stop the prescription iron  Please start the OTC iron sulfate  - 1 per day You should also take Colace OTC once or twice per day for having to strain with BM's Take all new medications as prescribed - the generic for allegra and flonase Continue all other medications as before  You will be contacted regarding the referral for: Dermatology

## 2011-01-27 NOTE — Progress Notes (Signed)
  Subjective:    Patient ID: [name], female    DOB: 1986/11/07, 25 y.o.   MRN: [id number]  HPI  Here to f/u as Dr Posey Rea not available;  Overall doing ok but does have darker skin to right lateral cheek area which she has scratched in the past and wants to see if can be lightened again;  Does also have 3 wks onset mild to mod nasal allergy symtpoms with congestion but no pain, fever or colored d/c or ST or cough.  Pt denies chest pain, increased sob or doe, wheezing, orthopnea, PND, increased LE swelling, palpitations, dizziness or syncope.    Pt denies new neurological symptoms such as new headache, or facial or extremity weakness or numbness.   Pt denies polydipsia, polyuria.  Iron prescription is too expensive and she wonders why she needs the B12 since her last B12 was normal.  Also never took the senakot S as she was never convinced at last visit that she needed it;  She has daily BM which does help relieve lower abd discomfort, but simply stool somewhat difficult to pass and has to strain to do so.    Review of Systems Review of Systems  Constitutional: Negative for diaphoresis and unexpected weight change.  HENT: Negative for drooling and tinnitus.   Eyes: Negative for photophobia and visual disturbance.  Respiratory: Negative for choking and stridor.   Gastrointestinal: Negative for vomiting and blood in stool.  Genitourinary: Negative for hematuria and decreased urine volume.  Musculoskeletal: Negative for gait problem.  Skin: Negative for color change and wound.  Neurological: Negative for tremors and numbness.  Psychiatric/Behavioral: Negative for decreased concentration. The patient is not hyperactive.       Objective:   Physical Exam Physical Exam  Constitutional: Pt appears well-developed and well-nourished.  HENT: Head: Normocephalic.  Right Ear: External ear normal.  Left Ear: External ear normal.   Nasal congestion noted Eyes: Conjunctivae and EOM are normal.  Pupils are equal, round, and reactive to light.  Neck: Normal range of motion. Neck supple.  Cardiovascular: Normal rate and regular rhythm.   Pulmonary/Chest: Effort normal and breath sounds normal.  Neurological: Pt is alert. No cranial nerve deficit.  Skin: Skin is warm. No erythema. Some dark blemishes noted right max sinus area Psychiatric: Pt behavior is normal. Thought content normal.   Past Medical History  Diagnosis Date  . GERD (gastroesophageal reflux disease)   . Allergy   . Anemia   . Menorrhagia   . Vitamin D deficiency 2011  . ALLERGIC RHINITIS 12/02/2007  . ALOPECIA 09/10/2010  . ANEMIA, IRON DEFICIENCY, MICROCYTIC 11/16/2009  . ANXIETY 07/09/2007  . CONSTIPATION, CHRONIC 12/17/2010  . GERD 07/06/2007  . HEART MURMUR, HX OF 07/06/2007  . VITAMIN D DEFICIENCY 09/10/2010   Past Surgical History  Procedure Date  . Cesarean section 2007  . Induced abortion 2010    reports that she has never smoked. She does not have any smokeless tobacco history on file. She reports that she does not drink alcohol or use illicit drugs. family history includes Hypertension in her other. Allergies:  nkda    Assessment & Plan:

## 2011-02-03 ENCOUNTER — Telehealth: Payer: Self-pay | Admitting: *Deleted

## 2011-02-03 NOTE — Telephone Encounter (Signed)
Ok--if we have

## 2011-02-03 NOTE — Telephone Encounter (Signed)
Patient requesting samples of any allergy meds or nasal sprays we have. What is ok to provide to pt?

## 2011-02-04 NOTE — Telephone Encounter (Signed)
Patient notified no samples at this time.  

## 2011-03-22 NOTE — Assessment & Plan Note (Signed)
[facility]                         GASTROENTEROLOGY OFFICE NOTE   NAME:[name]                        MRN:          [id number]  DATE:04/03/2007                            DOB:          [birth year]    CHIEF COMPLAINT:  Followup of nausea, reflux.   Her upper endoscopy demonstrated some mild reflux changes, confirmed by  biopsy.  She has been using her Aciphex intermittently with very good  control of her heartburn.  She has a lot of stress with her baby's  father and thinks that may be causing nausea.  Dr. Posey Rea prescribed  BuSpar, but she is not taking it b.i.d. because it makes her sleepy.  She does take it at night sometimes.  She is not taking any of her  medications regularly, it seems.  She denies any threats of abuse or  physical harm.  She clearly relates the nausea to difficulties in  dealing with the father of her child.   Her medications are listed and reviewed in the chart.   PAST MEDICAL HISTORY:  Reviewed and unchanged.   PHYSICAL EXAMINATION:  Weight 115 pounds, pulse 70, blood pressure  102/68.   ASSESSMENT:  1. Nausea, probably multifactorial.  Could be a component of reflux,      but I will bet it is the anxiety and stress.  2. Gastroesophageal reflux disease, intermittent proton pump inhibitor      therapy seems to control the heartburn complaints.   PLAN:  1. She should continue her Aciphex.  I think she should take it every      day for at least a while while we are sorting through the anxiety.  2. Discuss other medical therapy for anxiety with Dr. Posey Rea.  3. She is referred to behavioral health for counseling regarding      anxiety issues.  That may be the best route for this young lady      rather than medication, though BuSpar is a very benign drug.   Followup in GI as needed.     Iva Boop, MD,FACG  Electronically Signed    CEG/MedQ  DD: 04/03/2007  DT: 04/03/2007  Job #: [id number]   cc:    [name]. Plotnikov, MD

## 2011-03-25 NOTE — Assessment & Plan Note (Signed)
[facility]                                 ON-CALL NOTE   NAME:[name], [name]                          MRN:          [id number]  DATE:01/14/2007                            DOB:          [birth year]    Patient of Dr. Posey Rea.  Called from [phone] at 11:34 a.m. on January 14, 2007, stating his medication that we discussed this morning was not  called in. I checked the computer. It was not on [facility], but the  patient called back a third time, after she talked to the pharmacy,  stating that they did not receive it, so Phenergan 25 mg, #20, was  called into [facility] on [address], 1 p.o. q.i.d. p.r.n., no  refills.     Lelon Perla, DO  Electronically Signed    Shawnie Dapper  DD: 01/14/2007  DT: 01/14/2007  Job #: [id number]   cc:   [name]. Plotnikov, MD

## 2011-03-25 NOTE — Assessment & Plan Note (Signed)
[facility]                                 ON-CALL NOTE   NAME:[name], [name]                        MRN:          [id number]  DATE:01/14/2007                            DOB:          10/31/1986    She called from [phone].  She called at 7:08 a.m. on January 14, 2007  complaining of severe reflux.  She had seen Dr. Posey Rea, and was given  a prescription for Protonix, but did not take it because she did not  think she had reflux.  The temperature took it earlier this morning, but  it had not worked yet, and apparently the pharmacist told her it would  work in an hour.  She states she has been nauseous for 2 days, and has  not eaten much.  She has been eating soup, chicken noodle soup, and is  very nauseous.  I explained to the patient she needed to give the  medicine more time to work.  I could call in some Phenergan for her at  the pharmacy to help with it today, but she really needed to call Dr.  Posey Rea for a follow up appointment to let him know that the reflux  has gotten worse.  Phenergan 25 mg was called in, number 20, to take.  The patient will go to Urgent Care or the emergency room if symptoms  worsen today.  She stated that she had been to Urgent Care yesterday,  and __________ done but was unavailable, and would not be done until  Monday.     Lelon Perla, DO  Electronically Signed    Shawnie Dapper  DD: 01/14/2007  DT: 01/14/2007  Job #: [id number]   cc:   [name]. Plotnikov, MD

## 2011-03-25 NOTE — Discharge Summary (Signed)
NAMEPAULITA, [name]                 ACCOUNT NO.:  [id number]   MEDICAL RECORD NO.:  [id number]          PATIENT TYPE:  INP   LOCATION:  9130                          FACILITY:  [facility]   PHYSICIAN:  Roseanna Rainbow, M.D.DATE OF BIRTH:  1986-07-03   DATE OF ADMISSION:  03/13/2006  DATE OF DISCHARGE:  03/18/2006                                 DISCHARGE SUMMARY   CHIEF COMPLAINT:  The patient is an 25 year old gravida 2, para 0, with an  estimated date of confinement of April 30 with an intrauterine pregnancy at  41 weeks for augmentation of labor.  Please see the dictated operative  summary for further details.   HOSPITAL COURSE:  The patient was admitted. She was started on low-dose  Pitocin. She progressed in labor to 8 cm of dilatation at which point there  was no further progress.  The decision at this point was to proceed with a  cesarean delivery for arrest of dilatation in active phase.  Please see the  dictated operative summary for further details.   Her postoperative course was uneventful.  On postoperative day #1, her  hemoglobin was 10.  She was discharged to home on postoperative day #3,  tolerating a regular diet.   DISCHARGE DIAGNOSES:  1.  Intrauterine pregnancy at term.  2.  Arrest of dilatation in active phase.   PROCEDURE:  Cesarean delivery.   CONDITION:  Good.   DIET:  Regular.   ACTIVITY:  Progressive activity, pelvic rest.   MEDICATIONS:  Included Percocet, ibuprofen, prenatal vitamins.   DISPOSITION:  The patient was to follow up in the office in 2 weeks.      Roseanna Rainbow, M.D.  Electronically Signed     LAJ/MEDQ  D:  04/15/2006  T:  04/16/2006  Job:  [id number]

## 2011-03-25 NOTE — Assessment & Plan Note (Signed)
[facility]                         GASTROENTEROLOGY OFFICE NOTE   NAME:[name], [name]                        MRN:          [id number]  DATE:02/06/2007                            DOB:          [birth year]    REFERRING PHYSICIAN:  [name]. Plotnikov, MD   REASON FOR CONSULTATION:  Dysphagia.   ASSESSMENT:  Nausea, dyspepsia, and some vague dysphagia symptomatology.  This has been off and on since the fall of last year.  She is somewhat  better on AcipHex.  It is probably a reflux disease problem, but the  dysphagia in a young person raises the question of an eosinophilic  esophagitis issue.  This should be explored, as well as the possibility  of a peptic stricture or ring.   RECOMMENDATIONS AND PLAN:  1. Continue AcipHex at this time.  2. Reduce caffeine as she is doing.  3. Schedule upper GI endoscopy with possible biopsy or dilation.      Risks, benefits, and indications are reviewed.  4. Further plans pending that.   HISTORY:  This is a 25 year old African American woman who had a child  in May of last year.  Since that time she developed some intermittent  nausea, worse in October, some burning in her throat, and pyrosis.  No  overt reflux or water brash.  Dr. Jonny Ruiz had gotten a history for  dysphagia, and at first patient indicated she did not really have  problems, so questioned carefully, there does seem to be some  intermittent solid food dysphagia with a suprasternal sticking point  plus food moving down slow at times.  She has been very anxious over  this, and is afraid to eat because she is concerned something very bad  is going on.  She had been given Protonix a few months ago, but did not  take it because she did not think she had heartburn.  Now she has tried  AcipHex and seems to be better.  She has been still having some problems  after 2 to 3 weeks of AcipHex, and has continued to have some dysphagia,  though less frequently.   She is trying to reduce the amount of caffeine  that she is taking in.   MEDICATIONS:  AcipHex 20 mg daily.   DRUG ALLERGIES:  NONE KNOWN.   P.R.N. medications are Allegra.   PAST MEDICAL HISTORY:  1. Allergies.  2. Sinus problems.  3. Anxiety.   FAMILY HISTORY:  Diabetes in multiple relatives, 2nd degree.   SOCIAL HISTORY:  She is single.  She is working at [employer].  She has 1  son.  She is living with her parents.  No alcohol, tobacco or drugs.   REVIEW OF SYSTEMS:  There is some insomnia.  She is anxious about this  problem, and she has felt some mild dyspnea.  All other systems are  negative including all other GI review of systems as reflected in my  history form.   PHYSICAL EXAMINATION:  VITAL SIGNS:  Height 5 feet.  Weight 120.  Blood  pressure 102/58.  GENERAL:  This is  a young black woman in no acute distress.  HEENT:  The eyes are anicteric.  ENT:  Normal mouth, nose and pharynx.  NECK:  Supple.  No thyromegaly.  CHEST:  Clear.  HEART:  S1 and S2.  No murmurs, rubs, or gallops.  ABDOMEN:  Soft and non-tender.  No organomegaly or mass.  LYMPHATIC:  No neck or supraclavicular nodes.  EXTREMITIES:  No edema.  SKIN:  She has a tattoo on her left posterior shoulder.  PSYCHIATRIC:  She is alert and oriented x3.   I appreciate the opportunity for care for this patient.     Iva Boop, MD,FACG  Electronically Signed    CEG/MedQ  DD: 02/06/2007  DT: 02/06/2007  Job #: [id number]   cc:   Corwin Levins, MD

## 2011-03-25 NOTE — Assessment & Plan Note (Signed)
[facility]                                 ON-CALL NOTE   NAME:[name], Courtney DELMORE                        MRN:          [id number]  DATE:02/07/2007                            DOB:          [birth year]    Phone [phone].   DATE OF CALL:  February 07, 2007, Time 8:10 P.M.   PRIMARY CARE PHYSICIAN:  Gastroenterologist:  Iva Boop, M.D.   SUBJECTIVE:  [name] calls complaining of a very mild sore throat  following an endoscopy performed by Dr. Leone Payor earlier today.  She  states she did not have a dilation and did not have dysphagia symptoms.  She notes no chest pain, fevers, chills, shortness of breath, abdominal  pain, nausea, vomiting or any other symptoms.  Her sore throat seems  mild.   PLAN:  I advised her to drink warm liquids.  She may use Cepacol or  other over-the-counter throat lozenges and she may also use warm salt  water gargles.  If her symptoms do not resolve over the next 12 to 24  hours she is to contact Dr. Leone Payor for further advice.     Venita Lick. Russella Dar, MD, [facility]  Electronically Signed    MTS/MedQ  DD: 02/08/2007  DT: 02/08/2007  Job #: [id number]   cc:   Iva Boop, MD,FACG

## 2011-03-25 NOTE — Op Note (Signed)
NAMECLAIRA, [name]                 ACCOUNT NO.:  [id number]   MEDICAL RECORD NO.:  [id number]          PATIENT TYPE:  INP   LOCATION:  9130                          FACILITY:  [facility]   PHYSICIAN:  Roseanna Rainbow, M.D.DATE OF BIRTH:  Jan 28, 1986   DATE OF PROCEDURE:  03/15/2006  DATE OF DISCHARGE:                                 OPERATIVE REPORT   PREOPERATIVE DIAGNOSES:  1.  Intrauterine pregnancy at 41+ plus weeks.  2.  Arrest of dilatation, active phase.   POSTOPERATIVE DIAGNOSIS:  1.  Intrauterine pregnancy at 41+ plus weeks.  2.  Arrest of dilatation, active phase.  3.  Deep transverse arrest.   PROCEDURE:  Primary low uterine flap elliptical cesarean delivery via  Pfannenstiel skin incision.   SURGEON:  Roseanna Rainbow, M.D.   ANESTHESIA:  Epidural.   ESTIMATED BLOOD LOSS:  600 mL.   INTRAVENOUS FLUIDS:  As per anesthesiology.   URINE OUTPUT:  As per anesthesiology.   COMPLICATIONS:  None.   DESCRIPTION OF PROCEDURE:  The patient was taken to the operating room with  an epidural in situ to.  She was placed in the dorsal supine position and  prepped and draped in the usual sterile fashion.  A Pfannenstiel skin  incision was then made with scalpel and carried down to the underlying  fascia.  The fascia was nicked in the midline.  Fascial incision was then  extended bilaterally.  The superior aspect of the fascial incision was then  tented up with Kocher clamps and the underlying rectus muscles dissected  off.  The inferior aspect of the fascial incision was manipulated in a  similar fashion.  The rectus muscles were separated in the midline.  The  parietal peritoneum was then entered bluntly.  The peritoneal incision was  then extended superiorly and inferiorly with good venous visualization of  the bladder.  The bladder blade was then placed.  The vesicouterine  peritoneum was tented up and entered sharply with Metzenbaum scissors.  This  incision was then  extended bilaterally and a bladder flap created bluntly.  The lower uterine segment was incised in a transverse fashion with the  scalpel.  This incision was then extended bluntly.  The infant's head was  delivered atraumatically.  A loose nuchal cord x1 was readily reduced.  The  oropharynx was suctioned with bulb suction.  The cord was clamped and cut.  The infant was handed off to the waiting neonatologist.  The patient was  delivered of a live born female, Apgars 8 and 9, at one and five minutes, and  the weight was 7 pounds, 14 ounces.  The placenta was then removed.  The  intrauterine cavity was evacuated of any remaining amniotic fluid, clots and  debris with a moist laparotomy sponge.  The uterine incision was  reapproximated in a running interlocking fashion using 0 Monocryl.  A second  imbricating layer of the same suture was placed to obtain adequate  hemostasis.  The pericolic gutters were copiously irrigated.  The parietal  peritoneum was reapproximated in a running fashion with 2-0  Monocryl.  The  fascia was reapproximated in a running fashion with 0 Vicryl.  The skin was  reapproximated with staples.  At the close of procedure the instrument and  pack counts were said to be correct x2.  1 gram of cephazolin was given at  cord clamp.  The patient was taken to the PACU awake and in stable  condition.      Roseanna Rainbow, M.D.  Electronically Signed     LAJ/MEDQ  D:  03/15/2006  T:  03/16/2006  Job:  [id number]

## 2011-03-25 NOTE — H&P (Signed)
NAMELAURELL, [name]                 ACCOUNT NO.:  [id number]   MEDICAL RECORD NO.:  [id number]          PATIENT TYPE:  MAT   LOCATION:  MATC                          [facility]ILITY:  [facility]   PHYSICIAN:  Roseanna Rainbow, M.D.DATE OF BIRTH:  1986-06-25   DATE OF ADMISSION:  03/13/2006  DATE OF DISCHARGE:                                HISTORY & PHYSICAL   CHIEF COMPLAINT:  The patient is an 25 year old, gravida 2, para 0, with an  estimated date of confinement of April 30th with an intrauterine pregnancy  at 41 weeks and prodromal labor for augmentation of labor.   HISTORY OF PRESENT ILLNESS:  Please see the above.  The patient reports  contractions over the past several days.  She denies ruptured membranes.  She reports good fetal movement.   ALLERGIES:  No known drug allergies.   MEDICATIONS:  Prenatal vitamins.   OB RISK FACTORS:  GBS positive.  She has a history of a positive Chlamydia  DNA probe, and she is Rh-negative nonsensitized.   PAST OB/GYN HISTORY:  Please see the above.  She is status post one first  trimester spontaneous abortion.  She also has a history of an abnormal Pap  smear.  Last Pap smear in April of 2006 was normal.   PAST MEDICAL HISTORY:  No significant history of medical diseases.   PAST SURGICAL HISTORY:  No previous surgeries.   SOCIAL HISTORY:  She is unemployed, single, does not give any significant  history of alcohol abuse, has no significant smoking history, denies illicit  drug use.   FAMILY HISTORY:  No major illnesses known.   PHYSICAL EXAMINATION:  VITAL SIGNS:  Stable, afebrile.  OBSTETRIC EXAMINATION:  Fetal heart tracing reassuring, tocodynamometer  irregular contractions, sterile vaginal exam, cervix is 1 cm dilated, 60%  effaced.   ASSESSMENT:  Primigravida with an intrauterine pregnancy of 41 weeks, GBS  positive.   PLAN:  Admission, low-dose Pitocin augmentation of labor, penicillin GBS  prophylaxis.     Roseanna Rainbow, M.D.  Electronically Signed    LAJ/MEDQ  D:  03/13/2006  T:  03/13/2006  Job:  [id number]

## 2011-07-11 ENCOUNTER — Emergency Department (HOSPITAL_COMMUNITY)
Admission: EM | Admit: 2011-07-11 | Discharge: 2011-07-11 | Disposition: A | Discharge status: Home / Self Care | Payer: Medicaid Other | Attending: Emergency Medicine | Admitting: Emergency Medicine

## 2011-07-11 DIAGNOSIS — R42 Dizziness and giddiness: Secondary | ICD-10-CM | POA: Insufficient documentation

## 2011-07-11 DIAGNOSIS — R51 Headache: Secondary | ICD-10-CM | POA: Insufficient documentation

## 2011-07-11 LAB — POCT I-STAT, CHEM 8
Calcium, Ion: 1.17 mmol/L (ref 1.12–1.32)
Creatinine, Ser: 0.8 mg/dL (ref 0.50–1.10)
Glucose, Bld: 81 mg/dL (ref 70–99)
HCT: 41 % (ref 36.0–46.0)
Hemoglobin: 13.9 g/dL (ref 12.0–15.0)
Potassium: 3.4 mEq/L — ABNORMAL LOW (ref 3.5–5.1)
TCO2: 19 mmol/L (ref 0–100)

## 2011-07-11 LAB — CBC
HCT: 36.4 % (ref 36.0–46.0)
Hemoglobin: 11.3 g/dL — ABNORMAL LOW (ref 12.0–15.0)
MCV: 80.5 fL (ref 78.0–100.0)
RDW: 14.4 % (ref 11.5–15.5)
WBC: 8.2 10*3/uL (ref 4.0–10.5)

## 2011-07-11 LAB — URINALYSIS, ROUTINE W REFLEX MICROSCOPIC
Glucose, UA: NEGATIVE mg/dL
Ketones, ur: 15 mg/dL — AB
Leukocytes, UA: NEGATIVE
Nitrite: NEGATIVE
Protein, ur: NEGATIVE mg/dL

## 2011-07-11 LAB — DIFFERENTIAL
Basophils Absolute: 0 10*3/uL (ref 0.0–0.1)
Eosinophils Relative: 2 % (ref 0–5)
Lymphocytes Relative: 44 % (ref 12–46)
Lymphs Abs: 3.6 10*3/uL (ref 0.7–4.0)
Neutro Abs: 3.9 10*3/uL (ref 1.7–7.7)

## 2011-07-11 LAB — URINE MICROSCOPIC-ADD ON

## 2011-07-22 ENCOUNTER — Ambulatory Visit (INDEPENDENT_AMBULATORY_CARE_PROVIDER_SITE_OTHER): Payer: Medicaid Other | Admitting: Internal Medicine

## 2011-07-22 ENCOUNTER — Ambulatory Visit: Payer: PRIVATE HEALTH INSURANCE | Admitting: Internal Medicine

## 2011-07-22 ENCOUNTER — Encounter: Payer: Self-pay | Admitting: Internal Medicine

## 2011-07-22 VITALS — BP 92/60 | HR 81 | Temp 98.4°F | Ht 60.0 in | Wt 136.5 lb

## 2011-07-22 DIAGNOSIS — F411 Generalized anxiety disorder: Secondary | ICD-10-CM

## 2011-07-22 DIAGNOSIS — R21 Rash and other nonspecific skin eruption: Secondary | ICD-10-CM

## 2011-07-22 DIAGNOSIS — J309 Allergic rhinitis, unspecified: Secondary | ICD-10-CM

## 2011-07-22 MED ORDER — METHYLPREDNISOLONE ACETATE 80 MG/ML IJ SUSP
120.0000 mg | Freq: Once | INTRAMUSCULAR | Status: AC
  Administered 2011-07-22: 120 mg via INTRAMUSCULAR

## 2011-07-22 MED ORDER — PREDNISONE (PAK) 10 MG PO TABS: 10.0000 mg | ORAL_TABLET | Freq: Every day | ORAL | Status: AC

## 2011-07-22 MED ORDER — ETONOGESTREL-ETHINYL ESTRADIOL 0.12-0.015 MG/24HR VA RING: 1.0000 | VAGINAL_RING | VAGINAL | Status: DC

## 2011-07-22 NOTE — Assessment & Plan Note (Signed)
Moderate flare, to re-start the allegra/flonase she has at home, on evidence for URI today

## 2011-07-22 NOTE — Assessment & Plan Note (Signed)
stable overall by hx and exam, most recent data reviewed with pt, and pt to continue medical treatment as before  Lab Results  Component Value Date   WBC 8.2 07/11/2011   HGB 13.9 07/11/2011   HCT 41.0 07/11/2011   PLT 311 07/11/2011   ALT 11 08/05/2009   AST 19 08/05/2009   NA 141 07/11/2011   K 3.4* 07/11/2011   CL 108 07/11/2011   CREATININE 0.80 07/11/2011   BUN 8 07/11/2011   CO2 28 09/10/2010   TSH 0.98 09/10/2010

## 2011-07-22 NOTE — Assessment & Plan Note (Addendum)
C/w contact dermatitis type prob due to recent toner use ;  To d/c the toner, for depomedrol IM today,  to f/u any worsening symptoms or concerns, also for low dose prednisone for few days - done per emr

## 2011-07-22 NOTE — Patient Instructions (Addendum)
You had the steroid shot today Take all new medications as prescribed - the low dose prednisone for 5 days only Continue all other medications as before, including re-starting the allegra/flonase Please call if you develop more symptoms, such as increased pain or fever

## 2011-07-23 ENCOUNTER — Encounter: Payer: Self-pay | Admitting: Internal Medicine

## 2011-07-23 NOTE — Progress Notes (Signed)
  Subjective:    Patient ID: [name], female    DOB: 05/31/1986, 25 y.o.   MRN: [id number]  HPI  Here with acute onset mild red, swelling, itch to bilat facies after recent toner use, now stopped but the itching persists.   Pt denies fever, wt loss, night sweats, loss of appetite, or other constitutional symptoms  Does have several wks ongoing nasal allergy symptoms with clear congestion, itch and sneeze, without fever, pain, ST, cough or wheezing.  Denies worsening depressive symptoms, suicidal ideation, or panic, though has ongoing anxiety, not increased recently.   No other lip, tongue, skin change or wheezing/sob.  Pt denies new neurological symptoms such as new headache, or facial or extremity weakness or numbness   Pt denies polydipsia, polyuria. Past Medical History  Diagnosis Date  . GERD (gastroesophageal reflux disease)   . Allergy   . Anemia   . Menorrhagia   . Vitamin D deficiency 2011  . ALLERGIC RHINITIS 12/02/2007  . ALOPECIA 09/10/2010  . ANEMIA, IRON DEFICIENCY, MICROCYTIC 11/16/2009  . ANXIETY 07/09/2007  . CONSTIPATION, CHRONIC 12/17/2010  . GERD 07/06/2007  . HEART MURMUR, HX OF 07/06/2007  . VITAMIN D DEFICIENCY 09/10/2010   Past Surgical History  Procedure Date  . Cesarean section 2007  . Induced abortion 2010    reports that she has never smoked. She does not have any smokeless tobacco history on file. She reports that she does not drink alcohol or use illicit drugs. family history includes Hypertension in her other. No Known Allergies Current Outpatient Prescriptions on File Prior to Visit  Medication Sig Dispense Refill  . cholecalciferol (VITAMIN D) 1000 UNITS tablet Take 1,000 Units by mouth daily.        . fexofenadine (ALLEGRA) 180 MG tablet Take 1 tablet (180 mg total) by mouth daily.  30 tablet  11  . fluticasone (FLONASE) 50 MCG/ACT nasal spray 1 spray by Nasal route daily.  16 g  3   Review of Systems Review of Systems  Constitutional: Negative for  diaphoresis and unexpected weight change.  HENT: Negative for drooling and tinnitus.   Eyes: Negative for photophobia and visual disturbance.  Respiratory: Negative for choking and stridor.   Gastrointestinal: Negative for vomiting and blood in stool.  Genitourinary: Negative for hematuria and decreased urine volume.   Psychiatric/Behavioral: Negative for decreased concentration. The patient is not hyperactive.       Objective:   Physical Exam BP 92/60  Pulse 81  Temp(Src) 98.4 F (36.9 C) (Oral)  Ht 5' (1.524 m)  Wt 136 lb 8 oz (61.916 kg)  BMI 26.66 kg/m2  SpO2 99%  LMP 07/08/2011 Physical Exam  VS noted, not ill appearing Constitutional: Pt appears well-developed and well-nourished.  HENT: Head: Normocephalic.  Right Ear: External ear normal.  Left Ear: External ear normal.  Eyes: Conjunctivae and EOM are normal. Pupils are equal, round, and reactive to light.  Neck: Normal range of motion. Neck supple.  Cardiovascular: Normal rate and regular rhythm.   Pulmonary/Chest: Effort normal and breath sounds normal.  Neurological: Pt is alert. No cranial nerve deficit.  Skin: Skin is warm. No erythema. except for mild bilat cheek erythema without tender or red streaks, no lip or tongut swelling Psychiatric: Pt behavior is normal. Thought content normal. 1+ nervous    Assessment & Plan:

## 2011-08-08 LAB — URINE MICROSCOPIC-ADD ON

## 2011-08-08 LAB — URINALYSIS, ROUTINE W REFLEX MICROSCOPIC
Bilirubin Urine: NEGATIVE
Glucose, UA: NEGATIVE
Hgb urine dipstick: NEGATIVE
Specific Gravity, Urine: 1.023
Urobilinogen, UA: 0.2
pH: 7

## 2011-08-08 LAB — GC/CHLAMYDIA PROBE AMP, GENITAL
Chlamydia, DNA Probe: NEGATIVE
GC Probe Amp, Genital: NEGATIVE

## 2011-08-08 LAB — WET PREP, GENITAL: Trich, Wet Prep: NONE SEEN

## 2011-09-02 ENCOUNTER — Emergency Department (HOSPITAL_COMMUNITY): Payer: Medicaid Other

## 2011-09-02 ENCOUNTER — Emergency Department (HOSPITAL_COMMUNITY)
Admission: EM | Admit: 2011-09-02 | Discharge: 2011-09-02 | Disposition: A | Discharge status: Home / Self Care | Payer: Medicaid Other | Attending: Emergency Medicine | Admitting: Emergency Medicine

## 2011-09-02 DIAGNOSIS — R079 Chest pain, unspecified: Secondary | ICD-10-CM | POA: Insufficient documentation

## 2011-10-21 ENCOUNTER — Telehealth: Payer: Self-pay

## 2011-10-21 NOTE — Telephone Encounter (Signed)
Per AVP- OV tom Sat clinic or OV next week. I advised pt of this and she wants to come next week. I advised her to go ER if her current symptoms worsen or if she develops new symptoms. Pt understands/agrees.

## 2011-10-21 NOTE — Telephone Encounter (Signed)
Patient called triage c/o chest pain, fatigue, and occasional arm pain. She states that she was seen in the ED 10/12 and was told to follow up with PCP if symptoms continued. Patient states this has become constant over the last few days.

## 2011-10-24 ENCOUNTER — Ambulatory Visit: Payer: Medicaid Other | Admitting: Internal Medicine

## 2011-10-24 ENCOUNTER — Emergency Department (HOSPITAL_COMMUNITY)
Admission: EM | Admit: 2011-10-24 | Discharge: 2011-10-24 | Disposition: A | Discharge status: Home / Self Care | Payer: Medicaid Other | Source: Home / Self Care | Attending: Family Medicine | Admitting: Family Medicine

## 2011-10-24 DIAGNOSIS — R079 Chest pain, unspecified: Secondary | ICD-10-CM

## 2011-10-24 DIAGNOSIS — R0789 Other chest pain: Secondary | ICD-10-CM

## 2011-10-24 MED ORDER — DICLOFENAC POTASSIUM 50 MG PO TABS: 50.0000 mg | ORAL_TABLET | Freq: Three times a day (TID) | ORAL | Status: DC

## 2011-10-24 NOTE — ED Notes (Signed)
States she has been having pain in her left chest since October ; was instructed to f/u w her regular MD if she cont to have problems, but her MD no longer accepts her insurance. NAD at present, pain under left breast, and is exacerbated w direct pressure to chest wall, no change w deep breathing or upper extremity ROM

## 2011-10-24 NOTE — ED Provider Notes (Signed)
History     CSN: [id number] Arrival date & time: 10/24/2011  2:31 PM   First MD Initiated Contact with Patient 10/24/11 1442      Chief Complaint  Patient presents with  . Chest Pain    (Consider location/radiation/quality/duration/timing/severity/associated sxs/prior treatment) Patient is a 25 y.o. female presenting with chest pain. The history is provided by the patient.  Chest Pain The chest pain began more  than 1 month ago. Chest pain occurs intermittently. The chest pain is unchanged. The severity of the pain is mild. The quality of the pain is described as burning and aching. The pain does not radiate. Pertinent negatives for primary symptoms include no fever, no shortness of breath, no cough, no palpitations and no abdominal pain. She tried aspirin and NSAIDs for the symptoms. Risk factors include no known risk factors.     Past Medical History  Diagnosis Date  . GERD (gastroesophageal reflux disease)   . Allergy   . Anemia   . Menorrhagia   . Vitamin D deficiency 2011  . ALLERGIC RHINITIS 12/02/2007  . ALOPECIA 09/10/2010  . ANEMIA, IRON DEFICIENCY, MICROCYTIC 11/16/2009  . ANXIETY 07/09/2007  . CONSTIPATION, CHRONIC 12/17/2010  . GERD 07/06/2007  . HEART MURMUR, HX OF 07/06/2007  . VITAMIN D DEFICIENCY 09/10/2010    Past Surgical History  Procedure Date  . Cesarean section 2007  . Induced abortion 2010    Family History  Problem Relation Age of Onset  . Hypertension Other     History  Substance Use Topics  . Smoking status: Never Smoker   . Smokeless tobacco: Not on file  . Alcohol Use: No    OB History    Grav Para Term Preterm Abortions TAB SAB Ect Mult Living                  Review of Systems  Constitutional: Negative for fever.  Respiratory: Negative for cough and shortness of breath.   Cardiovascular: Positive for chest pain. Negative for palpitations.  Gastrointestinal: Negative for abdominal pain.    Allergies  Review of patient's  allergies indicates no known allergies.  Home Medications   Current Outpatient Rx  Name Route Sig Dispense Refill  . VITAMIN D 1000 UNITS PO TABS Oral Take 1,000 Units by mouth daily.      . ETONOGESTREL-ETHINYL ESTRADIOL 0.12-0.015 MG/24HR VA RING Vaginal Place 1 each vaginally every 21 ( twenty-one) days. Insert one (1) ring vaginally and leave in place for three (3) weeks, then remove for one (1) week. 1 each 11  . FEXOFENADINE HCL 180 MG PO TABS Oral Take 1 tablet (180 mg total) by mouth daily. 30 tablet 11  . FLUTICASONE PROPIONATE 50 MCG/ACT NA SUSP Nasal 1 spray by Nasal route daily. 16 g 3    BP 129/76  Pulse 78  Temp(Src) 99.3 F (37.4 C) (Oral)  Resp 14  SpO2 100%  Physical Exam  Nursing note and vitals reviewed. Constitutional: She appears well-developed and well-nourished.  HENT:  Head: Normocephalic.  Neck: Normal range of motion. Neck supple.  Cardiovascular: Normal rate, normal heart sounds and intact distal pulses.  Exam reveals no friction rub.   No murmur heard. Pulmonary/Chest: Effort normal and breath sounds normal. She exhibits tenderness.  Abdominal: Soft. Bowel sounds are normal.  Skin: Skin is warm and dry.    ED Course  Procedures (including critical care time)  Labs Reviewed - No data to display No results found.   No diagnosis found.  MDM          Barkley Bruns, MD 10/24/11 [phone]

## 2011-10-25 ENCOUNTER — Encounter (HOSPITAL_COMMUNITY): Payer: Self-pay | Admitting: *Deleted

## 2011-11-16 ENCOUNTER — Other Ambulatory Visit: Payer: Self-pay | Admitting: Obstetrics & Gynecology

## 2011-11-23 ENCOUNTER — Emergency Department (HOSPITAL_COMMUNITY)
Admission: EM | Admit: 2011-11-23 | Discharge: 2011-11-23 | Disposition: A | Discharge status: Home / Self Care | Payer: Medicaid Other | Attending: Emergency Medicine | Admitting: Emergency Medicine

## 2011-11-23 ENCOUNTER — Emergency Department (HOSPITAL_COMMUNITY): Payer: Medicaid Other

## 2011-11-23 ENCOUNTER — Other Ambulatory Visit: Payer: Self-pay

## 2011-11-23 ENCOUNTER — Encounter (HOSPITAL_COMMUNITY): Payer: Self-pay | Admitting: *Deleted

## 2011-11-23 DIAGNOSIS — F411 Generalized anxiety disorder: Secondary | ICD-10-CM | POA: Insufficient documentation

## 2011-11-23 DIAGNOSIS — D509 Iron deficiency anemia, unspecified: Secondary | ICD-10-CM | POA: Insufficient documentation

## 2011-11-23 DIAGNOSIS — J40 Bronchitis, not specified as acute or chronic: Secondary | ICD-10-CM

## 2011-11-23 DIAGNOSIS — Z79899 Other long term (current) drug therapy: Secondary | ICD-10-CM | POA: Insufficient documentation

## 2011-11-23 DIAGNOSIS — K219 Gastro-esophageal reflux disease without esophagitis: Secondary | ICD-10-CM | POA: Insufficient documentation

## 2011-11-23 DIAGNOSIS — N920 Excessive and frequent menstruation with regular cycle: Secondary | ICD-10-CM | POA: Insufficient documentation

## 2011-11-23 LAB — CBC
MCHC: 33 g/dL (ref 30.0–36.0)
RDW: 13.8 % (ref 11.5–15.5)

## 2011-11-23 LAB — BASIC METABOLIC PANEL
Calcium: 8.9 mg/dL (ref 8.4–10.5)
GFR calc non Af Amer: >90 mL/min (ref >90)
Glucose, Bld: 86 mg/dL (ref 70–99)
Sodium: 137 mEq/L (ref 135–145)

## 2011-11-23 LAB — D-DIMER, QUANTITATIVE: D-Dimer, Quant: 0.33 ug/mL-FEU (ref 0.00–0.48)

## 2011-11-23 MED ORDER — OXYCODONE-ACETAMINOPHEN 5-325 MG PO TABS
2.0000 | ORAL_TABLET | Freq: Once | ORAL | Status: AC
  Administered 2011-11-23: 2 via ORAL
  Filled 2011-11-23: qty 2

## 2011-11-23 MED ORDER — ALBUTEROL SULFATE HFA 108 (90 BASE) MCG/ACT IN AERS
2.0000 | INHALATION_SPRAY | RESPIRATORY_TRACT | Status: DC | PRN
  Administered 2011-11-23: 2 via RESPIRATORY_TRACT
  Filled 2011-11-23: qty 6.7

## 2011-11-23 MED ORDER — ALBUTEROL SULFATE (5 MG/ML) 0.5% IN NEBU
5.0000 mg | INHALATION_SOLUTION | Freq: Once | RESPIRATORY_TRACT | Status: AC
  Administered 2011-11-23: 5 mg via RESPIRATORY_TRACT
  Filled 2011-11-23: qty 1

## 2011-11-23 MED ORDER — IPRATROPIUM BROMIDE 0.02 % IN SOLN
0.5000 mg | Freq: Once | RESPIRATORY_TRACT | Status: AC
  Administered 2011-11-23: 0.5 mg via RESPIRATORY_TRACT
  Filled 2011-11-23: qty 2.5

## 2011-11-23 MED ORDER — TRAMADOL HCL 50 MG PO TABS
50.0000 mg | ORAL_TABLET | Freq: Four times a day (QID) | ORAL | Status: AC | PRN
Start: 2011-11-23 — End: 2011-12-03

## 2011-11-23 NOTE — ED Notes (Signed)
PA at bedside.

## 2011-11-23 NOTE — ED Notes (Signed)
Pt reports having intermittent chest pain to left side of chest described as aching x4 months. States for the past week pain has been constant and rated 8/10 with pain going to left shoulder, back, and left arm. Reports numbness to left arm . Pt states having some sob, and some pain with breathing. Denies N/V. Pt is A/O x4. Skin warm and dry. Respirations even and unlabored. NAD noted at this time.

## 2011-11-23 NOTE — ED Provider Notes (Signed)
History     CSN: [id number]  Arrival date & time 11/23/11  1319   First MD Initiated Contact with Patient 11/23/11 1437      Chief Complaint  Patient presents with  . Chest Pain    (Consider location/radiation/quality/duration/timing/severity/associated sxs/prior treatment) HPI  Pt presents to the ED with chest pain since October 2012. She states it has been hurting more consistently for the past week. She has also been coughing. Denies N/V/D fevers, chills, weight, palpitations or any other associated symptoms. She denies episodes of syncope or weakness. No recent long drives, hx of DVTs or lower extremity swelling. Pain does not worsen when pressure applied to chest.  Past Medical History  Diagnosis Date  . GERD (gastroesophageal reflux disease)   . Allergy   . Anemia   . Menorrhagia   . Vitamin D deficiency 2011  . ALLERGIC RHINITIS 12/02/2007  . ALOPECIA 09/10/2010  . ANEMIA, IRON DEFICIENCY, MICROCYTIC 11/16/2009  . ANXIETY 07/09/2007  . CONSTIPATION, CHRONIC 12/17/2010  . GERD 07/06/2007  . HEART MURMUR, HX OF 07/06/2007  . VITAMIN D DEFICIENCY 09/10/2010    Past Surgical History  Procedure Date  . Cesarean section 2007  . Induced abortion 2010    Family History  Problem Relation Age of Onset  . Hypertension Other   . Diabetes Other   . Heart disease Other     History  Substance Use Topics  . Smoking status: Never Smoker   . Smokeless tobacco: Never Used  . Alcohol Use: Yes     social drinker    OB History    Grav Para Term Preterm Abortions TAB SAB Ect Mult Living                  Review of Systems  All other systems reviewed and are negative.    Allergies  Review of patient's allergies indicates no known allergies.  Home Medications   Current Outpatient Rx  Name Route Sig Dispense Refill  . VITAMIN D 1000 UNITS PO TABS Oral Take 1,000 Units by mouth daily.      . ETONOGESTREL-ETHINYL ESTRADIOL 0.12-0.015 MG/24HR VA RING Vaginal Place 1 each  vaginally every 21 ( twenty-one) days. Insert one (1) ring vaginally and leave in place for three (3) weeks, then remove for one (1) week. 1 each 11  . FERROUS SULFATE 325 (65 FE) MG PO TABS Oral Take 325 mg by mouth daily.    . IBUPROFEN 800 MG PO TABS Oral Take 800 mg by mouth every 8 (eight) hours as needed. For pain.    Marland Kitchen TRAMADOL HCL 50 MG PO TABS Oral Take 1 tablet (50 mg total) by mouth every 6 (six) hours as needed for pain. 15 tablet 0    BP 115/68  Pulse 81  Temp(Src) 97.8 F (36.6 C) (Oral)  Resp 15  Ht 5' (1.524 m)  Wt 138 lb (62.596 kg)  BMI 26.95 kg/m2  SpO2 100%  LMP 10/29/2011  Physical Exam  Nursing note and vitals reviewed. Constitutional: She appears well-developed and well-nourished.  HENT:  Head: Normocephalic and atraumatic.  Eyes: Conjunctivae are normal. Pupils are equal, round, and reactive to light.  Neck: Trachea normal, normal range of motion and full passive range of motion without pain. Neck supple.  Cardiovascular: Normal rate, regular rhythm, normal heart sounds and normal pulses.        Pain is reproducible with pressure  Pulmonary/Chest: Effort normal and breath sounds normal. Chest wall is not dull to  percussion. She exhibits no tenderness, no crepitus, no edema, no deformity and no retraction.  Abdominal: Soft. Normal appearance and bowel sounds are normal.  Musculoskeletal: Normal range of motion.  Lymphadenopathy:       Head (right side): No submental, no submandibular, no tonsillar, no preauricular, no posterior auricular and no occipital adenopathy present.       Head (left side): No submental, no submandibular, no tonsillar, no preauricular, no posterior auricular and no occipital adenopathy present.    She has no cervical adenopathy.    She has no axillary adenopathy.  Neurological: She is alert. She has normal strength.  Skin: Skin is warm, dry and intact.  Psychiatric: Her speech is normal. Cognition and memory are normal.    ED  Course  Procedures (including critical care time)  Labs Reviewed  CBC - Abnormal; Notable for the following:    Hemoglobin 11.7 (*)    HCT 35.5 (*)    All other components within normal limits  D-DIMER, QUANTITATIVE  BASIC METABOLIC PANEL   Dg Chest 2 View  11/23/2011  *RADIOLOGY REPORT*  Clinical Data: Chest pain, left arm numbness and tingling, pain radiating to shoulder, shortness of breath  CHEST - 2 VIEW  Comparison: 09/02/2011  Findings: Normal heart size, mediastinal contours, and pulmonary vascularity. Peribronchial thickening and minimal hyperexpansion. No pulmonary infiltrate, pleural effusion or pneumothorax. No acute osseous findings.  IMPRESSION: Peribronchial thickening and hyperinflation, which could represent bronchitis or reactive airway disease. No acute infiltrate.  Original Report Authenticated By: Lollie Marrow, M.D.     1. Bronchitis       MDM   Date: 11/23/2011  Rate: 77  Rhythm: normal sinus rhythm  QRS Axis: normal  Intervals: normal  ST/T Wave abnormalities: normal  Conduction Disutrbances:none  Narrative Interpretation:   Old EKG Reviewed: unchanged from 09/01/2009          Dorthula Matas, PA 11/23/11 1723  Dorthula Matas, PA 12/28/11 [phone]

## 2011-11-23 NOTE — ED Notes (Signed)
EKG shown to Dr. Steinl 

## 2011-11-25 ENCOUNTER — Emergency Department (HOSPITAL_COMMUNITY)
Admission: EM | Admit: 2011-11-25 | Discharge: 2011-11-25 | Disposition: A | Discharge status: Home / Self Care | Payer: Medicaid Other | Attending: Emergency Medicine | Admitting: Emergency Medicine

## 2011-11-25 ENCOUNTER — Encounter (HOSPITAL_COMMUNITY): Payer: Self-pay | Admitting: Emergency Medicine

## 2011-11-25 ENCOUNTER — Other Ambulatory Visit: Payer: Self-pay

## 2011-11-25 DIAGNOSIS — J209 Acute bronchitis, unspecified: Secondary | ICD-10-CM

## 2011-11-25 DIAGNOSIS — R05 Cough: Secondary | ICD-10-CM | POA: Insufficient documentation

## 2011-11-25 DIAGNOSIS — R079 Chest pain, unspecified: Secondary | ICD-10-CM | POA: Insufficient documentation

## 2011-11-25 DIAGNOSIS — R0602 Shortness of breath: Secondary | ICD-10-CM | POA: Insufficient documentation

## 2011-11-25 DIAGNOSIS — K219 Gastro-esophageal reflux disease without esophagitis: Secondary | ICD-10-CM | POA: Insufficient documentation

## 2011-11-25 DIAGNOSIS — R059 Cough, unspecified: Secondary | ICD-10-CM | POA: Insufficient documentation

## 2011-11-25 LAB — POCT I-STAT, CHEM 8
Creatinine, Ser: 0.9 mg/dL (ref 0.50–1.10)
Glucose, Bld: 79 mg/dL (ref 70–99)
Hemoglobin: 13.6 g/dL (ref 12.0–15.0)
Potassium: 3.8 mEq/L (ref 3.5–5.1)
TCO2: 23 mmol/L (ref 0–100)

## 2011-11-25 NOTE — ED Provider Notes (Addendum)
History     CSN: [id number]  Arrival date & time 11/25/11  2042   First MD Initiated Contact with Patient 11/25/11 2128      Chief Complaint  Patient presents with  . Chest Pain    (Consider location/radiation/quality/duration/timing/severity/associated sxs/prior treatment) HPI Comments: The patient was seen for the same symptoms 2 days ago and on evaluation was found to have bronchitis. She was treated with an albuterol inhaler and ibuprofen. She returns today because she had experienced improvement in symptoms over the last 2 days but today at approximately 12:30 in the afternoon while at rest she felt central chest pressure with shortness of breath and cough for which she used her inhaler and symptoms resolved. She is currently having no chest pain. She returns to the ED stating that she was concerned that this was from her heart. Every assured her that she is at very low risk for coronary artery disease or myocardial infarction but assured her that I would evaluate for this. She is at present in no respiratory distress or distress otherwise. She has no lower extremity edema, pain on palpation of the calves, recent travel or immobilization, or prior history of DVT or PE. With the resolution of her symptoms after beta agonist inhaler, I do not suspect PE.  Patient is a 26 y.o. female presenting with chest pain. The history is provided by the patient and medical records.  Chest Pain Episode onset: 10 hours ago. Duration of episode(s) is 10 minutes. Episode frequency: One time, episodic. The chest pain is resolved (Resolved after treatment with albuterol inhaled). The pain is associated with breathing and coughing (Not associated with exertion, no recent travel, no lower extremity edema or pain, no prior blood clots or DVT.). At its most intense, the pain is at 5/10. The pain is currently at 0/10. The severity of the pain is moderate. The quality of the pain is described as dull, pressure-like and  tightness. The pain does not radiate. Exacerbated by: Nothing. Primary symptoms include shortness of breath and cough. Pertinent negatives for primary symptoms include no fever, no fatigue, no syncope, no wheezing, no palpitations, no abdominal pain, no nausea, no vomiting, no dizziness and no altered mental status.  The shortness of breath began today (10 hours ago). The shortness of breath developed at rest. The shortness of breath is mild. The patient's medical history does not include CHF, COPD, asthma or chronic lung disease.  Pertinent negatives for associated symptoms include no claudication, no diaphoresis, no lower extremity edema, no near-syncope, no numbness, no orthopnea, no paroxysmal nocturnal dyspnea and no weakness. She tried beta-agonist inhalers (Resolution with inhaled beta agonist) for the symptoms. Risk factors include no known risk factors.     Past Medical History  Diagnosis Date  . GERD (gastroesophageal reflux disease)   . Allergy   . Anemia   . Menorrhagia   . Vitamin D deficiency 2011  . ALLERGIC RHINITIS 12/02/2007  . ALOPECIA 09/10/2010  . ANEMIA, IRON DEFICIENCY, MICROCYTIC 11/16/2009  . ANXIETY 07/09/2007  . CONSTIPATION, CHRONIC 12/17/2010  . GERD 07/06/2007  . HEART MURMUR, HX OF 07/06/2007  . VITAMIN D DEFICIENCY 09/10/2010    Past Surgical History  Procedure Date  . Cesarean section 2007  . Induced abortion 2010    Family History  Problem Relation Age of Onset  . Hypertension Other   . Diabetes Other   . Heart disease Other     History  Substance Use Topics  . Smoking status: Never  Smoker   . Smokeless tobacco: Never Used  . Alcohol Use: Yes     social drinker    OB History    Grav Para Term Preterm Abortions TAB SAB Ect Mult Living                  Review of Systems  Constitutional: Negative for fever, diaphoresis and fatigue.  Respiratory: Positive for cough and shortness of breath. Negative for wheezing.   Cardiovascular: Positive for  chest pain. Negative for palpitations, orthopnea, claudication, syncope and near-syncope.  Gastrointestinal: Negative for nausea, vomiting and abdominal pain.  Neurological: Negative for dizziness, weakness and numbness.  Psychiatric/Behavioral: Negative for altered mental status.  All other systems reviewed and are negative.    Allergies  Review of patient's allergies indicates no known allergies.  Home Medications   Current Outpatient Rx  Name Route Sig Dispense Refill  . ALBUTEROL SULFATE HFA 108 (90 BASE) MCG/ACT IN AERS Inhalation Inhale 2 puffs into the lungs every 6 (six) hours as needed.    Marland Kitchen VITAMIN D 1000 UNITS PO TABS Oral Take 1,000 Units by mouth daily.      . ETONOGESTREL-ETHINYL ESTRADIOL 0.12-0.015 MG/24HR VA RING Vaginal Place 1 each vaginally every 21 ( twenty-one) days. Insert one (1) ring vaginally and leave in place for three (3) weeks, then remove for one (1) week. 1 each 11  . FERROUS SULFATE 325 (65 FE) MG PO TABS Oral Take 325 mg by mouth daily.    . IBUPROFEN 800 MG PO TABS Oral Take 800 mg by mouth every 8 (eight) hours as needed. For pain.    Marland Kitchen TRAMADOL HCL 50 MG PO TABS Oral Take 1 tablet (50 mg total) by mouth every 6 (six) hours as needed for pain. 15 tablet 0    BP 134/72  Pulse 88  Temp(Src) 98.5 F (36.9 C) (Oral)  Resp 20  Wt 138 lb (62.596 kg)  SpO2 100%  LMP 11/25/2011  Physical Exam  Nursing note and vitals reviewed. Constitutional: She is oriented to person, place, and time. She appears well-developed and well-nourished. No distress.  HENT:  Head: Normocephalic and atraumatic.  Mouth/Throat: Oropharynx is clear and moist.  Eyes: EOM are normal. Pupils are equal, round, and reactive to light.  Neck: Normal range of motion. Neck supple. No JVD present. No tracheal deviation present.  Cardiovascular: Normal rate, regular rhythm, normal heart sounds and intact distal pulses.   No extrasystoles are present. Exam reveals no gallop, no S3, no S4  and no friction rub.   No murmur heard. Pulmonary/Chest: Breath sounds normal. No accessory muscle usage or stridor. Not tachypneic. No respiratory distress. She has no wheezes. She has no rales. She exhibits no tenderness.  Abdominal: Soft. Bowel sounds are normal. She exhibits no distension. There is no tenderness.  Musculoskeletal: Normal range of motion. She exhibits no edema and no tenderness.       No lower extremity edema or tenderness to palpation  Neurological: She is alert and oriented to person, place, and time. She has normal reflexes. No cranial nerve deficit. Coordination normal.  Skin: Skin is warm and dry. No rash noted. She is not diaphoretic. No erythema. No pallor.  Psychiatric: She has a normal mood and affect. Her behavior is normal. Judgment and thought content normal.    ED Course  Procedures (including critical care time)   Date: 11/25/2011  Rate: 84   Rhythm: normal sinus rhythm  QRS Axis: normal  Intervals: normal  ST/T Wave abnormalities: nonspecific T wave changes  Conduction Disutrbances:none  Narrative Interpretation: Non-provocative EKG  Old EKG Reviewed: none available    Labs Reviewed  I-STAT TROPONIN I  I-STAT, CHEM 8   No results found.   No diagnosis found.    MDM  Musculoskeletal chest pain, bronchitis, costochondritis, GERD, Gastrointestinal Chest Pain, Pleuritic Chest Pain, Esophageal Spasm, Arrhythmia.  ACS or MI is not likely in this low risk patient but will be evaluated for with EKG and cardiac enzymes.  Chest x-ray performed 2 days ago showing bronchitis.  Pneumonia is not suggested by examination or history.  Nor is pulmonary embolism.  Cough is improving.  No repeat CXR will be performed based on recent CXR reviewed by me, and history/exam not suggestive of worsening parenchymal disease.        Felisa Bonier, MD 11/25/11 [id number]  Felisa Bonier, MD 11/25/11 [phone]

## 2011-11-25 NOTE — ED Notes (Signed)
Pt states she has been having chest pain off and on since October  Pt states she has had some shortness of breath for a few days and was seen here for that recently  Pt states today around noon she started having pain in her chest that she describes as a pressure in the center of her chest  Pt states she continues to have pressure but denies any pain

## 2011-11-28 ENCOUNTER — Other Ambulatory Visit: Payer: Self-pay | Admitting: Obstetrics

## 2011-12-14 DIAGNOSIS — Z8719 Personal history of other diseases of the digestive system: Secondary | ICD-10-CM | POA: Insufficient documentation

## 2011-12-28 NOTE — ED Provider Notes (Signed)
Medical screening examination/treatment/procedure(s) were performed by non-physician practitioner and as supervising physician I was immediately available for consultation/collaboration.   Loren Racer, MD 12/28/11 1520

## 2012-03-03 ENCOUNTER — Encounter (HOSPITAL_COMMUNITY): Payer: Self-pay | Admitting: *Deleted

## 2012-03-03 ENCOUNTER — Emergency Department (HOSPITAL_COMMUNITY)
Admission: EM | Admit: 2012-03-03 | Discharge: 2012-03-03 | Disposition: A | Discharge status: Home / Self Care | Payer: Medicaid Other | Attending: Emergency Medicine | Admitting: Emergency Medicine

## 2012-03-03 ENCOUNTER — Emergency Department (HOSPITAL_COMMUNITY): Payer: Medicaid Other

## 2012-03-03 DIAGNOSIS — E559 Vitamin D deficiency, unspecified: Secondary | ICD-10-CM | POA: Insufficient documentation

## 2012-03-03 DIAGNOSIS — K219 Gastro-esophageal reflux disease without esophagitis: Secondary | ICD-10-CM | POA: Insufficient documentation

## 2012-03-03 DIAGNOSIS — K5909 Other constipation: Secondary | ICD-10-CM | POA: Insufficient documentation

## 2012-03-03 DIAGNOSIS — Z79899 Other long term (current) drug therapy: Secondary | ICD-10-CM | POA: Insufficient documentation

## 2012-03-03 DIAGNOSIS — R072 Precordial pain: Secondary | ICD-10-CM | POA: Insufficient documentation

## 2012-03-03 NOTE — Discharge Instructions (Signed)
Chest Pain (Nonspecific) It is often hard to give a specific diagnosis for the cause of chest pain. There is always a chance that your pain could be related to something serious, such as a heart attack or a blood clot in the lungs. You need to follow up with your caregiver for further evaluation. CAUSES   Heartburn.   Pneumonia or bronchitis.   Anxiety or stress.   Inflammation around your heart (pericarditis) or lung (pleuritis or pleurisy).   A blood clot in the lung.   A collapsed lung (pneumothorax). It can develop suddenly on its own (spontaneous pneumothorax) or from injury (trauma) to the chest.   Shingles infection (herpes zoster virus).  The chest wall is composed of bones, muscles, and cartilage. Any of these can be the source of the pain.  The bones can be bruised by injury.   The muscles or cartilage can be strained by coughing or overwork.   The cartilage can be affected by inflammation and become sore (costochondritis).  DIAGNOSIS  Lab tests or other studies, such as X-rays, electrocardiography, stress testing, or cardiac imaging, may be needed to find the cause of your pain.  TREATMENT   Treatment depends on what may be causing your chest pain. Treatment may include:   Acid blockers for heartburn.   Anti-inflammatory medicine.   Pain medicine for inflammatory conditions.   Antibiotics if an infection is present.   You may be advised to change lifestyle habits. This includes stopping smoking and avoiding alcohol, caffeine, and chocolate.   You may be advised to keep your head raised (elevated) when sleeping. This reduces the chance of acid going backward from your stomach into your esophagus.   Most of the time, nonspecific chest pain will improve within 2 to 3 days with rest and mild pain medicine.  HOME CARE INSTRUCTIONS   If antibiotics were prescribed, take your antibiotics as directed. Finish them even if you start to feel better.   For the next few  days, avoid physical activities that bring on chest pain. Continue physical activities as directed.   Do not smoke.   Avoid drinking alcohol.   Only take over-the-counter or prescription medicine for pain, discomfort, or fever as directed by your caregiver.   Follow your caregiver's suggestions for further testing if your chest pain does not go away.   Keep any follow-up appointments you made. If you do not go to an appointment, you could develop lasting (chronic) problems with pain. If there is any problem keeping an appointment, you must call to reschedule.  SEEK MEDICAL CARE IF:   You think you are having problems from the medicine you are taking. Read your medicine instructions carefully.   Your chest pain does not go away, even after treatment.   You develop a rash with blisters on your chest.  SEEK IMMEDIATE MEDICAL CARE IF:   You have increased chest pain or pain that spreads to your arm, neck, jaw, back, or abdomen.   You develop shortness of breath, an increasing cough, or you are coughing up blood.   You have severe back or abdominal pain, feel nauseous, or vomit.   You develop severe weakness, fainting, or chills.   You have a fever.  THIS IS AN EMERGENCY. Do not wait to see if the pain will go away. Get medical help at once. Call your local emergency services (911 in [place] Do not drive yourself to the hospital. MAKE SURE YOU:   Understand these instructions.     Will watch your condition.   Will get help right away if you are not doing well or get worse.  Document Released: 08/03/2005 Document Revised: 10/13/2011 Document Reviewed: 05/29/2008 Exit[employer] Patient Information 2012 [employer]. 

## 2012-03-03 NOTE — ED Provider Notes (Signed)
History    26 year old female with chest pain. Pain is substernal to left sided. Patient has had similar pain previously dating back to October. Most recently the pain began last night. Worse than normally is. Describes is achy with occasional sharper pain. No appreciable exacerbating relieving factors. She denies trauma. Denies shortness of breath, nausea or palpitations. No diaphoresis. No unusual leg pain or swelling. Denies history of blood clot. Reports previous evaluations for similar symptoms. Apparently no clear etiology at this time. CSN: [id number]  Arrival date & time 03/03/12  1249   First MD Initiated Contact with Patient 03/03/12 1411      Chief Complaint  Patient presents with  . Chest Pain    (Consider location/radiation/quality/duration/timing/severity/associated sxs/prior treatment) HPI  Past Medical History  Diagnosis Date  . GERD (gastroesophageal reflux disease)   . Allergy   . Anemia   . Menorrhagia   . Vitamin d deficiency 2011  . ALLERGIC RHINITIS 12/02/2007  . ALOPECIA 09/10/2010  . ANEMIA, IRON DEFICIENCY, MICROCYTIC 11/16/2009  . ANXIETY 07/09/2007  . CONSTIPATION, CHRONIC 12/17/2010  . GERD 07/06/2007  . HEART MURMUR, HX OF 07/06/2007  . VITAMIN D DEFICIENCY 09/10/2010    Past Surgical History  Procedure Date  . Cesarean section 2007  . Induced abortion 2010    Family History  Problem Relation Age of Onset  . Hypertension Other   . Diabetes Other   . Heart disease Other     History  Substance Use Topics  . Smoking status: Never Smoker   . Smokeless tobacco: Never Used  . Alcohol Use: Yes     social drinker    OB History    Grav Para Term Preterm Abortions TAB SAB Ect Mult Living                  Review of Systems   Review of symptoms negative unless otherwise noted in HPI.  Allergies  Review of patient's allergies indicates no known allergies.  Home Medications   Current Outpatient Rx  Name Route Sig Dispense Refill  .  ALBUTEROL SULFATE HFA 108 (90 BASE) MCG/ACT IN AERS Inhalation Inhale 2 puffs into the lungs every 6 (six) hours as needed.    Marland Kitchen VITAMIN D 1000 UNITS PO TABS Oral Take 1,000 Units by mouth daily.      . ETONOGESTREL-ETHINYL ESTRADIOL 0.12-0.015 MG/24HR VA RING Vaginal Place 1 each vaginally every 21 ( twenty-one) days. Insert one (1) ring vaginally and leave in place for three (3) weeks, then remove for one (1) week. 1 each 11  . FERROUS SULFATE 325 (65 FE) MG PO TABS Oral Take 325 mg by mouth daily.    . IBUPROFEN 800 MG PO TABS Oral Take 800 mg by mouth every 8 (eight) hours as needed. For pain.      BP 114/68  Pulse 95  Temp(Src) 98.6 F (37 C) (Oral)  Resp 16  Ht 5' (1.524 m)  Wt 130 lb (58.968 kg)  BMI 25.39 kg/m2  SpO2 100%  LMP 02/22/2012  Physical Exam  Nursing note and vitals reviewed. Constitutional: She appears well-developed and well-nourished. No distress.       Sitting up in chair. No acute distress.  HENT:  Head: Normocephalic and atraumatic.  Eyes: Conjunctivae are normal. Right eye exhibits no discharge. Left eye exhibits no discharge.  Neck: Neck supple.  Cardiovascular: Normal rate, regular rhythm and normal heart sounds.  Exam reveals no gallop and no friction rub.   No murmur heard.  History of murmur but none appreciated on my exam.  Pulmonary/Chest: Effort normal and breath sounds normal. No respiratory distress. She exhibits no tenderness.  Abdominal: Soft. She exhibits no distension. There is no tenderness.  Musculoskeletal: She exhibits no edema and no tenderness.       Lower extremities symmetric as compared to each other. No calf tenderness. Negative Homan's. No palpable cords.   Neurological: She is alert.  Skin: Skin is warm and dry.  Psychiatric: She has a normal mood and affect. Her behavior is normal. Thought content normal.    ED Course  Procedures (including critical care time)  Labs Reviewed - No data to display Dg Chest 2  View  03/03/2012  *RADIOLOGY REPORT*  Clinical Data:  Left-sided chest pain.  CHEST - 2 VIEW  Comparison: 11/23/2011  Findings: The heart size and mediastinal contours are within normal limits.  Both lungs are clear.  The visualized skeletal structures are unremarkable.  IMPRESSION: No active disease.  Original Report Authenticated By: Reola Calkins, M.D.   EKG:  Rhythm: Normal sinus rhythm Rate: 88 Axis: Normal axis Intervals: Normal intervals ST segments: Nonspecific ST changes. There is some mild T-wave flattening anteriorly and inferiorly. Comparison: Stable from previous EKG from 11/25/2011   1. Chest pain       MDM  26-year-old female with chest pain. Atypical. Pain appears to be chronic in nature. She reports previous cardiology evaluations which reports are unremarkable. EKG today is non-provocative in stable from previous. Chest x-ray is clear. Patient with no acute distress on exam. Consider pulmonary embolism, especially with exogenous estrogen use, but doubt. Return precautions were discussed. Outpatient followup otherwise.        Raeford Razor, MD 03/08/12 1334

## 2012-03-03 NOTE — ED Notes (Signed)
Pt reports left sided chest pain starting in October of last year and is intermittent. Pt reports she has been seen here multiple times for the same pain and saw a Cardiologist.  Pt reports the cardiologist told her she has a heart murmur and otherwise her heart was fine. Pt reports pain became severe last night.  Pt denies taking medication for pain. Pt states nothing helps pain.  Pt denies shortness or breath, nausea or vomiting.

## 2012-12-04 ENCOUNTER — Telehealth: Payer: Self-pay | Admitting: Internal Medicine

## 2012-12-04 NOTE — Telephone Encounter (Signed)
The patient called and swears she does not have [place] access at this time.  She states she is a self pay pt.  I went ahead and scheduled her, but was not sure if this was okay....   Thanks!

## 2012-12-04 NOTE — Telephone Encounter (Signed)
I tried to e-verify the patients medicaid and it is showing as "inactive", we are able to see this patient as self pay but if her Medicaid coverage status changes prior to the appointment she would need to go to her [place] provider

## 2012-12-06 ENCOUNTER — Ambulatory Visit (INDEPENDENT_AMBULATORY_CARE_PROVIDER_SITE_OTHER): Payer: Self-pay | Admitting: Internal Medicine

## 2012-12-06 ENCOUNTER — Other Ambulatory Visit (INDEPENDENT_AMBULATORY_CARE_PROVIDER_SITE_OTHER): Payer: Self-pay

## 2012-12-06 ENCOUNTER — Encounter: Payer: Self-pay | Admitting: Internal Medicine

## 2012-12-06 VITALS — BP 120/90 | HR 68 | Temp 98.0°F | Resp 16 | Wt 146.0 lb

## 2012-12-06 DIAGNOSIS — R5381 Other malaise: Secondary | ICD-10-CM

## 2012-12-06 DIAGNOSIS — D508 Other iron deficiency anemias: Secondary | ICD-10-CM

## 2012-12-06 DIAGNOSIS — K5904 Chronic idiopathic constipation: Secondary | ICD-10-CM

## 2012-12-06 DIAGNOSIS — K5909 Other constipation: Secondary | ICD-10-CM

## 2012-12-06 DIAGNOSIS — R5383 Other fatigue: Secondary | ICD-10-CM

## 2012-12-06 DIAGNOSIS — E559 Vitamin D deficiency, unspecified: Secondary | ICD-10-CM

## 2012-12-06 DIAGNOSIS — L659 Nonscarring hair loss, unspecified: Secondary | ICD-10-CM

## 2012-12-06 LAB — HEPATIC FUNCTION PANEL
ALT: 12 U/L (ref 0–35)
AST: 16 U/L (ref 0–37)
Alkaline Phosphatase: 52 U/L (ref 39–117)
Bilirubin, Direct: 0 mg/dL (ref 0.0–0.3)
Total Protein: 7.5 g/dL (ref 6.0–8.3)

## 2012-12-06 LAB — CBC WITH DIFFERENTIAL/PLATELET
Basophils Relative: 1.1 % (ref 0.0–3.0)
Eosinophils Relative: 1.8 % (ref 0.0–5.0)
HCT: 34.9 % — ABNORMAL LOW (ref 36.0–46.0)
Hemoglobin: 11.3 g/dL — ABNORMAL LOW (ref 12.0–15.0)
Lymphs Abs: 3.3 10*3/uL (ref 0.7–4.0)
MCV: 77.8 fl — ABNORMAL LOW (ref 78.0–100.0)
Monocytes Absolute: 0.6 10*3/uL (ref 0.1–1.0)
Neutro Abs: 4.2 10*3/uL (ref 1.4–7.7)
Platelets: 326 10*3/uL (ref 150.0–400.0)
WBC: 8.3 10*3/uL (ref 4.5–10.5)

## 2012-12-06 LAB — URINALYSIS, ROUTINE W REFLEX MICROSCOPIC
Nitrite: NEGATIVE
Specific Gravity, Urine: 1.02 (ref 1.000–1.030)
Total Protein, Urine: NEGATIVE
pH: 6.5 (ref 5.0–8.0)

## 2012-12-06 LAB — IBC PANEL: Iron: 19 ug/dL — ABNORMAL LOW (ref 42–145)

## 2012-12-06 MED ORDER — LINACLOTIDE 145 MCG PO CAPS: 145.0000 ug | ORAL_CAPSULE | ORAL | Status: DC

## 2012-12-06 NOTE — Assessment & Plan Note (Signed)
Labs

## 2012-12-06 NOTE — Assessment & Plan Note (Signed)
Try linzess 

## 2012-12-06 NOTE — Assessment & Plan Note (Signed)
Re-start Vit D 

## 2012-12-06 NOTE — Progress Notes (Signed)
  Subjective:    Patient ID: [name], female    DOB: 12-Oct-1986, 27 y.o.   MRN: [id number]  HPI  C/o constipation - has to strain x 2-3 years. Miralax doesn't help... C/o hair loss since 2011 C/o vit D def - not taking rx   Review of Systems  Constitutional: Negative for fever, chills, diaphoresis, activity change, appetite change, fatigue and unexpected weight change.  HENT: Negative for hearing loss, ear pain, congestion, sore throat, sneezing, mouth sores, neck pain, dental problem, voice change, postnasal drip and sinus pressure.   Eyes: Negative for pain and visual disturbance.  Respiratory: Negative for cough, chest tightness, wheezing and stridor.   Cardiovascular: Negative for chest pain, palpitations and leg swelling.  Gastrointestinal: Negative for nausea, vomiting, abdominal pain, blood in stool, abdominal distention and rectal pain.  Genitourinary: Negative for dysuria, hematuria, decreased urine volume, vaginal bleeding, vaginal discharge, difficulty urinating, vaginal pain and menstrual problem.  Musculoskeletal: Negative for back pain, joint swelling and gait problem.  Skin: Negative for color change, rash and wound.  Neurological: Negative.  Negative for dizziness, tremors, syncope, speech difficulty and light-headedness.  Hematological: Negative for adenopathy.  Psychiatric/Behavioral: Negative for suicidal ideas, hallucinations, behavioral problems, confusion, sleep disturbance, dysphoric mood and decreased concentration. The patient is not hyperactive.        Objective:   Physical Exam  Constitutional: She appears well-developed. No distress.  HENT:  Head: Normocephalic.  Right Ear: External ear normal.  Left Ear: External ear normal.  Nose: Nose normal.  Mouth/Throat: Oropharynx is clear and moist.  Eyes: Conjunctivae normal are normal. Pupils are equal, round, and reactive to light. Right eye exhibits no discharge. Left eye exhibits no discharge.  Neck:  Normal range of motion. Neck supple. No JVD present. No tracheal deviation present. No thyromegaly present.  Cardiovascular: Normal rate, regular rhythm and normal heart sounds.   Pulmonary/Chest: No stridor. No respiratory distress. She has no wheezes.  Abdominal: Soft. Bowel sounds are normal. She exhibits no distension and no mass. There is no tenderness. There is no rebound and no guarding.  Musculoskeletal: She exhibits no edema and no tenderness.  Lymphadenopathy:    She has no cervical adenopathy.  Neurological: She displays normal reflexes. No cranial nerve deficit. She exhibits normal muscle tone. Coordination normal.  Skin: No rash noted. No erythema.  Psychiatric: She has a normal mood and affect. Her behavior is normal. Judgment and thought content normal.          Assessment & Plan:

## 2012-12-07 LAB — ANA: Anti Nuclear Antibody(ANA): NEGATIVE

## 2012-12-08 ENCOUNTER — Telehealth: Payer: Self-pay | Admitting: Internal Medicine

## 2012-12-08 MED ORDER — ERGOCALCIFEROL 1.25 MG (50000 UT) PO CAPS: 50000.0000 [IU] | ORAL_CAPSULE | ORAL | Status: DC

## 2012-12-08 NOTE — Telephone Encounter (Signed)
[name], please, inform patient that her vit D is low - start Rx Thx

## 2012-12-10 NOTE — Telephone Encounter (Signed)
Left detailed mess informing pt of below.  

## 2012-12-22 ENCOUNTER — Other Ambulatory Visit: Payer: Self-pay

## 2013-06-03 ENCOUNTER — Other Ambulatory Visit: Payer: Self-pay | Admitting: *Deleted

## 2013-06-03 ENCOUNTER — Other Ambulatory Visit: Payer: Self-pay | Admitting: Internal Medicine

## 2013-06-03 MED ORDER — ERGOCALCIFEROL 1.25 MG (50000 UT) PO CAPS: 50000.0000 [IU] | ORAL_CAPSULE | ORAL | Status: DC

## 2013-06-07 NOTE — Telephone Encounter (Signed)
Needs ov

## 2013-09-12 ENCOUNTER — Other Ambulatory Visit: Payer: Self-pay

## 2013-11-18 ENCOUNTER — Encounter: Payer: Self-pay | Admitting: Internal Medicine

## 2013-11-18 ENCOUNTER — Ambulatory Visit (INDEPENDENT_AMBULATORY_CARE_PROVIDER_SITE_OTHER): Payer: Self-pay | Admitting: Internal Medicine

## 2013-11-18 ENCOUNTER — Other Ambulatory Visit (INDEPENDENT_AMBULATORY_CARE_PROVIDER_SITE_OTHER): Payer: Self-pay

## 2013-11-18 VITALS — BP 130/72 | HR 88 | Temp 98.6°F | Resp 16 | Wt 160.0 lb

## 2013-11-18 DIAGNOSIS — R5381 Other malaise: Secondary | ICD-10-CM

## 2013-11-18 DIAGNOSIS — D508 Other iron deficiency anemias: Secondary | ICD-10-CM

## 2013-11-18 DIAGNOSIS — R5383 Other fatigue: Secondary | ICD-10-CM

## 2013-11-18 DIAGNOSIS — R42 Dizziness and giddiness: Secondary | ICD-10-CM

## 2013-11-18 LAB — URINALYSIS
Bilirubin Urine: NEGATIVE
KETONES UR: NEGATIVE
Leukocytes, UA: NEGATIVE
Nitrite: NEGATIVE
Specific Gravity, Urine: 1.025 (ref 1.000–1.030)
Total Protein, Urine: NEGATIVE
URINE GLUCOSE: NEGATIVE
UROBILINOGEN UA: 1 (ref 0.0–1.0)
pH: 7 (ref 5.0–8.0)

## 2013-11-18 LAB — CBC WITH DIFFERENTIAL/PLATELET
Basophils Absolute: 0.1 10*3/uL (ref 0.0–0.1)
Basophils Relative: 0.7 % (ref 0.0–3.0)
Eosinophils Absolute: 0.2 10*3/uL (ref 0.0–0.7)
Eosinophils Relative: 1.6 % (ref 0.0–5.0)
HCT: 32.8 % — ABNORMAL LOW (ref 36.0–46.0)
Hemoglobin: 10.4 g/dL — ABNORMAL LOW (ref 12.0–15.0)
LYMPHS PCT: 32.5 % (ref 12.0–46.0)
Lymphs Abs: 3.2 10*3/uL (ref 0.7–4.0)
MCHC: 31.6 g/dL (ref 30.0–36.0)
MCV: 76.4 fl — ABNORMAL LOW (ref 78.0–100.0)
Monocytes Absolute: 0.9 10*3/uL (ref 0.1–1.0)
Monocytes Relative: 9.6 % (ref 3.0–12.0)
NEUTROS PCT: 55.6 % (ref 43.0–77.0)
Neutro Abs: 5.5 10*3/uL (ref 1.4–7.7)
Platelets: 390 10*3/uL (ref 150.0–400.0)
RBC: 4.29 Mil/uL (ref 3.87–5.11)
RDW: 16.7 % — ABNORMAL HIGH (ref 11.5–14.6)
WBC: 9.9 10*3/uL (ref 4.5–10.5)

## 2013-11-18 MED ORDER — MECLIZINE HCL 12.5 MG PO TABS: 12.5000 mg | ORAL_TABLET | Freq: Three times a day (TID) | ORAL | Status: DC | PRN

## 2013-11-18 NOTE — Progress Notes (Signed)
Pre visit review using our clinic review tool, if applicable. No additional management support is needed unless otherwise documented below in the visit note. 

## 2013-11-18 NOTE — Assessment & Plan Note (Signed)
Labs

## 2013-11-18 NOTE — Progress Notes (Signed)
   Subjective:    HPI  C/o dizziness off and on x 10 mo Son w/pink eye C/o vit D def - not taking rx   Review of Systems  Constitutional: Negative for fever, chills, diaphoresis, activity change, appetite change, fatigue and unexpected weight change.  HENT: Negative for congestion, dental problem, ear pain, hearing loss, mouth sores, postnasal drip, sinus pressure, sneezing, sore throat and voice change.   Eyes: Negative for pain and visual disturbance.  Respiratory: Negative for cough, chest tightness, wheezing and stridor.   Cardiovascular: Negative for chest pain, palpitations and leg swelling.  Gastrointestinal: Negative for nausea, vomiting, abdominal pain, blood in stool, abdominal distention and rectal pain.  Genitourinary: Negative for dysuria, hematuria, decreased urine volume, vaginal bleeding, vaginal discharge, difficulty urinating, vaginal pain and menstrual problem.  Musculoskeletal: Negative for back pain, gait problem, joint swelling and neck pain.  Skin: Negative for color change, rash and wound.  Neurological: Negative.  Negative for dizziness, tremors, syncope, speech difficulty and light-headedness.  Hematological: Negative for adenopathy.  Psychiatric/Behavioral: Negative for suicidal ideas, hallucinations, behavioral problems, confusion, sleep disturbance, dysphoric mood and decreased concentration. The patient is not hyperactive.        Objective:   Physical Exam  Constitutional: She appears well-developed. No distress.  HENT:  Head: Normocephalic.  Right Ear: External ear normal.  Left Ear: External ear normal.  Nose: Nose normal.  Mouth/Throat: Oropharynx is clear and moist.  Eyes: Conjunctivae are normal. Pupils are equal, round, and reactive to light. Right eye exhibits no discharge. Left eye exhibits no discharge.  Neck: Normal range of motion. Neck supple. No JVD present. No tracheal deviation present. No thyromegaly present.  Cardiovascular: Normal  rate, regular rhythm and normal heart sounds.   Pulmonary/Chest: No stridor. No respiratory distress. She has no wheezes.  Abdominal: Soft. Bowel sounds are normal. She exhibits no distension and no mass. There is no tenderness. There is no rebound and no guarding.  Musculoskeletal: She exhibits no edema and no tenderness.  Lymphadenopathy:    She has no cervical adenopathy.  Neurological: She displays normal reflexes. No cranial nerve deficit. She exhibits normal muscle tone. Coordination normal.  Skin: No rash noted. No erythema.  Psychiatric: She has a normal mood and affect. Her behavior is normal. Judgment and thought content normal.    H-P (-) B     Assessment & Plan:

## 2013-11-18 NOTE — Patient Instructions (Addendum)
Benign Positional Vertigo symptoms  If dizzy:  Start Meclizine. Start Francee PiccoloBrandt - Daroff exercise several times a day as dirrected.  Gluten free trial (no wheat products) for 4-6 weeks. OK to use gluten-free bread and gluten-free pasta.  Milk free trial (no milk, ice cream, cheese and yogurt) for 4-6 weeks. OK to use almond, coconut, rice or soy milk. "Almond breeze" brand tastes good.

## 2013-11-19 ENCOUNTER — Encounter: Payer: Self-pay | Admitting: Internal Medicine

## 2013-11-19 DIAGNOSIS — R42 Dizziness and giddiness: Secondary | ICD-10-CM | POA: Insufficient documentation

## 2013-11-19 LAB — HEPATIC FUNCTION PANEL
ALBUMIN: 3.5 g/dL (ref 3.5–5.2)
ALT: 13 U/L (ref 0–35)
AST: 18 U/L (ref 0–37)
Alkaline Phosphatase: 63 U/L (ref 39–117)
BILIRUBIN DIRECT: 0.1 mg/dL (ref 0.0–0.3)
TOTAL PROTEIN: 7.4 g/dL (ref 6.0–8.3)
Total Bilirubin: 0.4 mg/dL (ref 0.3–1.2)

## 2013-11-19 LAB — BASIC METABOLIC PANEL
BUN: 12 mg/dL (ref 6–23)
CHLORIDE: 106 meq/L (ref 96–112)
CO2: 27 meq/L (ref 19–32)
Calcium: 9.1 mg/dL (ref 8.4–10.5)
Creatinine, Ser: 0.8 mg/dL (ref 0.4–1.2)
GFR: 117.97 mL/min (ref >60.00)
Glucose, Bld: 86 mg/dL (ref 70–99)
POTASSIUM: 4.3 meq/L (ref 3.5–5.1)
Sodium: 139 mEq/L (ref 135–145)

## 2013-11-19 LAB — TSH: TSH: 1.74 u[IU]/mL (ref 0.35–5.50)

## 2013-11-19 LAB — IBC PANEL
Iron: 30 ug/dL — ABNORMAL LOW (ref 42–145)
SATURATION RATIOS: 5.6 % — AB (ref 20.0–50.0)
TRANSFERRIN: 379.6 mg/dL — AB (ref 212.0–360.0)

## 2013-11-19 LAB — VITAMIN B12: VITAMIN B 12: 445 pg/mL (ref 211–911)

## 2013-11-19 NOTE — Assessment & Plan Note (Signed)
Better  

## 2013-11-19 NOTE — Assessment & Plan Note (Signed)
1/15 likely BPV  Start Meclizine. Start Francee PiccoloBrandt - Daroff exercise several times a day as dirrected.

## 2013-11-20 ENCOUNTER — Other Ambulatory Visit: Payer: Self-pay | Admitting: Internal Medicine

## 2013-11-20 MED ORDER — FERRALET 90 90-1 MG PO TABS
ORAL_TABLET | ORAL | Status: DC
Start: 2013-11-20 — End: 2015-03-19

## 2014-02-11 ENCOUNTER — Encounter: Payer: Self-pay | Admitting: Internal Medicine

## 2014-02-11 ENCOUNTER — Ambulatory Visit (INDEPENDENT_AMBULATORY_CARE_PROVIDER_SITE_OTHER): Payer: BC Managed Care – PPO | Admitting: Internal Medicine

## 2014-02-11 VITALS — BP 118/80 | HR 76 | Temp 99.2°F | Resp 16 | Wt 160.0 lb

## 2014-02-11 DIAGNOSIS — J069 Acute upper respiratory infection, unspecified: Secondary | ICD-10-CM

## 2014-02-11 MED ORDER — AZITHROMYCIN 250 MG PO TABS: ORAL_TABLET | ORAL | Status: DC

## 2014-02-11 NOTE — Assessment & Plan Note (Signed)
z pac 

## 2014-02-11 NOTE — Progress Notes (Signed)
   Subjective:    URI  This is a new problem. The current episode started in the past 7 days. The problem has been waxing and waning. There has been no fever. Associated symptoms include congestion, coughing, rhinorrhea, sinus pain, a sore throat and swollen glands. Pertinent negatives include no abdominal pain, chest pain, dysuria, ear pain, nausea, neck pain, rash, sneezing, vomiting or wheezing.     C/o vit D def - not taking rx   Review of Systems  Constitutional: Negative for fever, chills, diaphoresis, activity change, appetite change, fatigue and unexpected weight change.  HENT: Positive for congestion, rhinorrhea and sore throat. Negative for dental problem, ear pain, hearing loss, mouth sores, postnasal drip, sinus pressure, sneezing and voice change.   Eyes: Negative for pain and visual disturbance.  Respiratory: Positive for cough. Negative for chest tightness, wheezing and stridor.   Cardiovascular: Negative for chest pain, palpitations and leg swelling.  Gastrointestinal: Negative for nausea, vomiting, abdominal pain, blood in stool, abdominal distention and rectal pain.  Genitourinary: Negative for dysuria, hematuria, decreased urine volume, vaginal bleeding, vaginal discharge, difficulty urinating, vaginal pain and menstrual problem.  Musculoskeletal: Negative for back pain, gait problem, joint swelling and neck pain.  Skin: Negative for color change, rash and wound.  Neurological: Negative.  Negative for dizziness, tremors, syncope, speech difficulty and light-headedness.  Hematological: Negative for adenopathy.  Psychiatric/Behavioral: Negative for suicidal ideas, hallucinations, behavioral problems, confusion, sleep disturbance, dysphoric mood and decreased concentration. The patient is not hyperactive.        Objective:   Physical Exam  Constitutional: She appears well-developed. No distress.  HENT:  Head: Normocephalic.  Right Ear: External ear normal.  Left Ear:  External ear normal.  Nose: Nose normal.  eryth throat  Eyes: Conjunctivae are normal. Pupils are equal, round, and reactive to light. Right eye exhibits no discharge. Left eye exhibits no discharge.  Neck: Normal range of motion. Neck supple. No JVD present. No tracheal deviation present. No thyromegaly present.  Cardiovascular: Normal rate, regular rhythm and normal heart sounds.   Pulmonary/Chest: No stridor. No respiratory distress. She has no wheezes.  Abdominal: Soft. Bowel sounds are normal. She exhibits no distension and no mass. There is no tenderness. There is no rebound and no guarding.  Musculoskeletal: She exhibits no edema and no tenderness.  Lymphadenopathy:    She has no cervical adenopathy.  Neurological: She displays normal reflexes. No cranial nerve deficit. She exhibits normal muscle tone. Coordination normal.  Skin: No rash noted. No erythema.  Psychiatric: She has a normal mood and affect. Her behavior is normal. Judgment and thought content normal.        Assessment & Plan:

## 2014-02-11 NOTE — Progress Notes (Signed)
Pre visit review using our clinic review tool, if applicable. No additional management support is needed unless otherwise documented below in the visit note. 

## 2014-03-24 ENCOUNTER — Ambulatory Visit: Payer: Self-pay | Admitting: Obstetrics

## 2014-04-29 ENCOUNTER — Ambulatory Visit: Payer: BC Managed Care – PPO | Admitting: Obstetrics

## 2014-08-22 ENCOUNTER — Other Ambulatory Visit: Payer: Self-pay

## 2014-08-27 ENCOUNTER — Other Ambulatory Visit: Payer: Self-pay

## 2014-08-27 MED ORDER — VITAMIN D (ERGOCALCIFEROL) 1.25 MG (50000 UNIT) PO CAPS: ORAL_CAPSULE | ORAL | Status: DC

## 2014-09-01 ENCOUNTER — Other Ambulatory Visit: Payer: Self-pay | Admitting: Internal Medicine

## 2014-09-05 ENCOUNTER — Other Ambulatory Visit: Payer: Self-pay | Admitting: Geriatric Medicine

## 2014-09-05 MED ORDER — VITAMIN D (ERGOCALCIFEROL) 1.25 MG (50000 UNIT) PO CAPS: ORAL_CAPSULE | ORAL | Status: DC

## 2015-03-06 ENCOUNTER — Ambulatory Visit (INDEPENDENT_AMBULATORY_CARE_PROVIDER_SITE_OTHER): Payer: 59 | Admitting: Family

## 2015-03-06 ENCOUNTER — Encounter: Payer: Self-pay | Admitting: Family

## 2015-03-06 VITALS — BP 110/70 | HR 85 | Temp 98.8°F | Resp 18 | Ht 60.0 in | Wt 168.8 lb

## 2015-03-06 DIAGNOSIS — J302 Other seasonal allergic rhinitis: Secondary | ICD-10-CM

## 2015-03-06 MED ORDER — FLUTICASONE PROPIONATE 50 MCG/ACT NA SUSP: 2.0000 | Freq: Every day | NASAL | Status: DC

## 2015-03-06 MED ORDER — OLOPATADINE HCL 0.1 % OP SOLN: 1.0000 [drp] | Freq: Two times a day (BID) | OPHTHALMIC | Status: DC

## 2015-03-06 NOTE — Progress Notes (Signed)
Pre visit review using our clinic review tool, if applicable. No additional management support is needed unless otherwise documented below in the visit note. 

## 2015-03-06 NOTE — Patient Instructions (Signed)
Thank you for choosing [facility].  Summary/Instructions:  Your prescription(s) have been submitted to your pharmacy or been printed and provided for you. Please take as directed and contact our office if you believe you are having problem(s) with the medication(s) or have any questions.  If your symptoms worsen or fail to improve, please contact our office for further instruction, or in case of emergency go directly to the emergency room at the closest medical facility.   Allergic Rhinitis Allergic rhinitis is when the mucous membranes in the nose respond to allergens. Allergens are particles in the air that cause your body to have an allergic reaction. This causes you to release allergic antibodies. Through a chain of events, these eventually cause you to release histamine into the blood stream. Although meant to protect the body, it is this release of histamine that causes your discomfort, such as frequent sneezing, congestion, and an itchy, runny nose.  CAUSES  Seasonal allergic rhinitis (hay fever) is caused by pollen allergens that may come from grasses, trees, and weeds. Year-round allergic rhinitis (perennial allergic rhinitis) is caused by allergens such as house dust mites, pet dander, and mold spores.  SYMPTOMS   Nasal stuffiness (congestion).  Itchy, runny nose with sneezing and tearing of the eyes. DIAGNOSIS  Your health care provider can help you determine the allergen or allergens that trigger your symptoms. If you and your health care provider are unable to determine the allergen, skin or blood testing may be used. TREATMENT  Allergic rhinitis does not have a cure, but it can be controlled by:  Medicines and allergy shots (immunotherapy).  Avoiding the allergen. Hay fever may often be treated with antihistamines in pill or nasal spray forms. Antihistamines block the effects of histamine. There are over-the-counter medicines that may help with nasal congestion and  swelling around the eyes. Check with your health care provider before taking or giving this medicine.  If avoiding the allergen or the medicine prescribed do not work, there are many new medicines your health care provider can prescribe. Stronger medicine may be used if initial measures are ineffective. Desensitizing injections can be used if medicine and avoidance does not work. Desensitization is when a patient is given ongoing shots until the body becomes less sensitive to the allergen. Make sure you follow up with your health care provider if problems continue. HOME CARE INSTRUCTIONS It is not possible to completely avoid allergens, but you can reduce your symptoms by taking steps to limit your exposure to them. It helps to know exactly what you are allergic to so that you can avoid your specific triggers. SEEK MEDICAL CARE IF:   You have a fever.  You develop a cough that does not stop easily (persistent).  You have shortness of breath.  You start wheezing.  Symptoms interfere with normal daily activities. Document Released: 07/19/2001 Document Revised: 10/29/2013 Document Reviewed: 07/01/2013 [employer] Patient Information 2015 [employer]. This information is not intended to replace advice given to you by your health care provider. Make sure you discuss any questions you have with your health care provider.   

## 2015-03-06 NOTE — Progress Notes (Signed)
Subjective:    Patient ID: [name], female    DOB: 1986-01-18, 29 y.o.   MRN: [id number]  Chief Complaint  Patient presents with  . Allergies    x1 month, eyes have been bothering her, says they are itching and wants to know if there are any allergy eye drops, she has sneezing and runny nose associated with it    HPI:  [name] is a 29 y.o. female GERD, anxiety, seasonal allergies, and heart murmur who presents today for an acute office visit.   This is a new problem. Associated symptoms of sneezing, runny nose, watery/itchy eyes has been going on for about month. Has tried claritin-D, allegra-D, and Zatidor eye drops which have helped some. She has tried her friends patanol eye drops which helped some as well. Timing of the symptoms is fairly constant throughout the day.  No Known Allergies   Current Outpatient Prescriptions on File Prior to Visit  Medication Sig Dispense Refill  . albuterol (PROVENTIL HFA;VENTOLIN HFA) 108 (90 BASE) MCG/ACT inhaler Inhale 2 puffs into the lungs every 6 (six) hours as needed.    Marland Kitchen. azithromycin (ZITHROMAX) 250 MG tablet As directed 6 tablet 0  . cholecalciferol (VITAMIN D) 1000 UNITS tablet Take 1,000 Units by mouth daily.      Marland Kitchen. Fe Cbn-Fe Gluc-FA-B12-C-DSS (FERRALET 90) 90-1 MG TABS 1 po qd 30 each 5  . ibuprofen (ADVIL,MOTRIN) 800 MG tablet Take 800 mg by mouth every 8 (eight) hours as needed. For pain.    . Linaclotide (LINZESS) 145 MCG CAPS Take 1 capsule (145 mcg total) by mouth 1 day or 1 dose. 30 capsule 11  . meclizine (ANTIVERT) 12.5 MG tablet Take 1 tablet (12.5 mg total) by mouth 3 (three) times daily as needed for dizziness. 60 tablet 1  . Vitamin D, Ergocalciferol, (DRISDOL) 50000 UNITS CAPS capsule TAKE ONE CAPSULE BY MOUTH WEEKLY 6 capsule 2  . etonogestrel-ethinyl estradiol (NUVARING) 0.12-0.015 MG/24HR vaginal ring Place 1 each vaginally every 21 ( twenty-one) days. Insert one (1) ring vaginally and leave in place for three  (3) weeks, then remove for one (1) week.     No current facility-administered medications on file prior to visit.     Review of Systems  HENT: Positive for rhinorrhea and sneezing.   Eyes: Positive for discharge (watery).  Respiratory: Negative for cough, chest tightness and shortness of breath.   Neurological: Positive for headaches.      Objective:    BP 110/70 mmHg  Pulse 85  Temp(Src) 98.8 F (37.1 C) (Oral)  Resp 18  Ht 5' (1.524 m)  Wt 168 lb 12.8 oz (76.567 kg)  BMI 32.97 kg/m2  SpO2 99% Nursing note and vital signs reviewed.  Physical Exam  Constitutional: She is oriented to person, place, and time. She appears well-developed and well-nourished. No distress.  HENT:  Right Ear: Hearing, tympanic membrane, external ear and ear canal normal.  Left Ear: Hearing, tympanic membrane, external ear and ear canal normal.  Nose: Nose normal. Right sinus exhibits no maxillary sinus tenderness and no frontal sinus tenderness. Left sinus exhibits no maxillary sinus tenderness and no frontal sinus tenderness.  Mouth/Throat: Uvula is midline, oropharynx is clear and moist and mucous membranes are normal.  Mild turbinate paleness noted.   Cardiovascular: Normal rate, regular rhythm, normal heart sounds and intact distal pulses.   Pulmonary/Chest: Effort normal and breath sounds normal.  Neurological: She is alert and oriented to person, place, and time.  Skin: Skin is warm and dry.  Psychiatric: She has a normal mood and affect. Her behavior is normal. Judgment and thought content normal.       Assessment & Plan:

## 2015-03-06 NOTE — Assessment & Plan Note (Signed)
Symptoms and exam consistent with allergic rhinitis. Continue second-generation antihistamines on daily basis. Add Flonase. Start Patanol per patient request. Consider montelukast if symptoms do not improve. Follow-up if symptoms worsen or fail to improve.

## 2015-03-16 ENCOUNTER — Encounter (HOSPITAL_COMMUNITY): Payer: Self-pay

## 2015-03-16 ENCOUNTER — Emergency Department (HOSPITAL_COMMUNITY)
Admission: EM | Admit: 2015-03-16 | Discharge: 2015-03-16 | Disposition: A | Discharge status: Home / Self Care | Payer: 59 | Attending: Emergency Medicine | Admitting: Emergency Medicine

## 2015-03-16 ENCOUNTER — Emergency Department (HOSPITAL_COMMUNITY): Payer: 59

## 2015-03-16 DIAGNOSIS — S6991XA Unspecified injury of right wrist, hand and finger(s), initial encounter: Secondary | ICD-10-CM | POA: Diagnosis present

## 2015-03-16 DIAGNOSIS — Y998 Other external cause status: Secondary | ICD-10-CM | POA: Diagnosis not present

## 2015-03-16 DIAGNOSIS — R011 Cardiac murmur, unspecified: Secondary | ICD-10-CM | POA: Diagnosis not present

## 2015-03-16 DIAGNOSIS — Z862 Personal history of diseases of the blood and blood-forming organs and certain disorders involving the immune mechanism: Secondary | ICD-10-CM | POA: Insufficient documentation

## 2015-03-16 DIAGNOSIS — S52501A Unspecified fracture of the lower end of right radius, initial encounter for closed fracture: Secondary | ICD-10-CM

## 2015-03-16 DIAGNOSIS — Y9352 Activity, horseback riding: Secondary | ICD-10-CM | POA: Insufficient documentation

## 2015-03-16 DIAGNOSIS — S5290XA Unspecified fracture of unspecified forearm, initial encounter for closed fracture: Secondary | ICD-10-CM

## 2015-03-16 DIAGNOSIS — K219 Gastro-esophageal reflux disease without esophagitis: Secondary | ICD-10-CM | POA: Diagnosis not present

## 2015-03-16 DIAGNOSIS — Z79899 Other long term (current) drug therapy: Secondary | ICD-10-CM | POA: Insufficient documentation

## 2015-03-16 DIAGNOSIS — S52591A Other fractures of lower end of right radius, initial encounter for closed fracture: Secondary | ICD-10-CM | POA: Diagnosis not present

## 2015-03-16 DIAGNOSIS — Y9289 Other specified places as the place of occurrence of the external cause: Secondary | ICD-10-CM | POA: Insufficient documentation

## 2015-03-16 DIAGNOSIS — Z8781 Personal history of (healed) traumatic fracture: Secondary | ICD-10-CM | POA: Insufficient documentation

## 2015-03-16 HISTORY — DX: Unspecified fracture of unspecified forearm, initial encounter for closed fracture: S52.90XA

## 2015-03-16 MED ORDER — LORAZEPAM 1 MG PO TABS
1.0000 mg | ORAL_TABLET | Freq: Once | ORAL | Status: AC
  Administered 2015-03-16: 1 mg via ORAL
  Filled 2015-03-16: qty 1

## 2015-03-16 MED ORDER — OXYCODONE-ACETAMINOPHEN 5-325 MG PO TABS: 1.0000 | ORAL_TABLET | ORAL | Status: DC | PRN

## 2015-03-16 MED ORDER — IBUPROFEN 200 MG PO TABS
600.0000 mg | ORAL_TABLET | Freq: Once | ORAL | Status: AC
  Administered 2015-03-16: 600 mg via ORAL
  Filled 2015-03-16: qty 3

## 2015-03-16 MED ORDER — MORPHINE SULFATE 4 MG/ML IJ SOLN
6.0000 mg | Freq: Once | INTRAMUSCULAR | Status: AC
  Administered 2015-03-16: 6 mg via INTRAMUSCULAR
  Filled 2015-03-16: qty 2

## 2015-03-16 NOTE — Discharge Instructions (Signed)
Forearm Fracture °Your caregiver has diagnosed you as having a broken bone (fracture) of the forearm. This is the part of your arm between the elbow and your wrist. Your forearm is made up of two bones. These are the radius and ulna. A fracture is a break in one or both bones. A cast or splint is used to protect and keep your injured bone from moving. The cast or splint will be on generally for about 5 to 6 weeks, with individual variations. °HOME CARE INSTRUCTIONS  °· Keep the injured part elevated while sitting or lying down. Keeping the injury above the level of your heart (the center of the chest). This will decrease swelling and pain. °· Apply ice to the injury for 15-20 minutes, 03-04 times per day while awake, for 2 days. Put the ice in a plastic bag and place a thin towel between the bag of ice and your cast or splint. °· If you have a plaster or fiberglass cast: °¨ Do not try to scratch the skin under the cast using sharp or pointed objects. °¨ Check the skin around the cast every day. You may put lotion on any red or sore areas. °¨ Keep your cast dry and clean. °· If you have a plaster splint: °¨ Wear the splint as directed. °¨ You may loosen the elastic around the splint if your fingers become numb, tingle, or turn cold or blue. °· Do not put pressure on any part of your cast or splint. It may break. Rest your cast only on a pillow the first 24 hours until it is fully hardened. °· Your cast or splint can be protected during bathing with a plastic bag. Do not lower the cast or splint into water. °· Only take over-the-counter or prescription medicines for pain, discomfort, or fever as directed by your caregiver. °SEEK IMMEDIATE MEDICAL CARE IF:  °· Your cast gets damaged or breaks. °· You have more severe pain or swelling than you did before the cast. °· Your skin or nails below the injury turn blue or gray, or feel cold or numb. °· There is a bad smell or new stains and/or pus like (purulent) drainage  coming from under the cast. °MAKE SURE YOU:  °· Understand these instructions. °· Will watch your condition. °· Will get help right away if you are not doing well or get worse. °Document Released: 10/21/2000 Document Revised: 01/16/2012 Document Reviewed: 06/12/2008 °Exit[employer]® Patient Information ©2015 [employer]. This information is not intended to replace advice given to you by your health care provider. Make sure you discuss any questions you have with your health care provider. ° °

## 2015-03-16 NOTE — ED Notes (Signed)
Pt c/o R wrist injury after falling off a horse.  Pain score 5/10 increasing w/ movement.  Deformity noted.  Denies numbness and tingling.  Cap refill <3 sec.

## 2015-03-19 ENCOUNTER — Encounter (HOSPITAL_BASED_OUTPATIENT_CLINIC_OR_DEPARTMENT_OTHER): Payer: Self-pay | Admitting: *Deleted

## 2015-03-19 NOTE — Pre-Procedure Instructions (Signed)
Echo requested from Dr. Roseanne KaufmanKadakia's office.

## 2015-03-19 NOTE — Pre-Procedure Instructions (Signed)
No echo in pt's chart, per Verlon AuLeslie at Dr. Roseanne KaufmanKadakia's office.

## 2015-03-20 ENCOUNTER — Encounter (HOSPITAL_BASED_OUTPATIENT_CLINIC_OR_DEPARTMENT_OTHER): Payer: Self-pay | Admitting: *Deleted

## 2015-03-20 ENCOUNTER — Ambulatory Visit (HOSPITAL_BASED_OUTPATIENT_CLINIC_OR_DEPARTMENT_OTHER)
Admission: RE | Admit: 2015-03-20 | Discharge: 2015-03-20 | Disposition: A | Discharge status: Home / Self Care | Payer: 59 | Source: Ambulatory Visit | Attending: Orthopedic Surgery | Admitting: Orthopedic Surgery

## 2015-03-20 ENCOUNTER — Encounter (HOSPITAL_BASED_OUTPATIENT_CLINIC_OR_DEPARTMENT_OTHER)
Admission: RE | Disposition: A | Discharge status: Home / Self Care | Payer: Self-pay | Source: Ambulatory Visit | Attending: Orthopedic Surgery

## 2015-03-20 ENCOUNTER — Ambulatory Visit (HOSPITAL_BASED_OUTPATIENT_CLINIC_OR_DEPARTMENT_OTHER): Payer: 59 | Admitting: Anesthesiology

## 2015-03-20 DIAGNOSIS — K219 Gastro-esophageal reflux disease without esophagitis: Secondary | ICD-10-CM | POA: Diagnosis not present

## 2015-03-20 DIAGNOSIS — F1099 Alcohol use, unspecified with unspecified alcohol-induced disorder: Secondary | ICD-10-CM | POA: Diagnosis not present

## 2015-03-20 DIAGNOSIS — S52571A Other intraarticular fracture of lower end of right radius, initial encounter for closed fracture: Secondary | ICD-10-CM | POA: Diagnosis not present

## 2015-03-20 DIAGNOSIS — S52501A Unspecified fracture of the lower end of right radius, initial encounter for closed fracture: Secondary | ICD-10-CM | POA: Diagnosis present

## 2015-03-20 HISTORY — DX: Unspecified fracture of the lower end of right radius, initial encounter for closed fracture: S52.501A

## 2015-03-20 HISTORY — DX: Personal history of other diseases of the circulatory system: Z86.79

## 2015-03-20 HISTORY — PX: OPEN REDUCTION INTERNAL FIXATION (ORIF) DISTAL RADIAL FRACTURE: SHX5989

## 2015-03-20 HISTORY — DX: Unspecified fracture of unspecified forearm, initial encounter for closed fracture: S52.90XA

## 2015-03-20 LAB — POCT HEMOGLOBIN-HEMACUE: HEMOGLOBIN: 10.3 g/dL — AB (ref 12.0–15.0)

## 2015-03-20 SURGERY — OPEN REDUCTION INTERNAL FIXATION (ORIF) DISTAL RADIUS FRACTURE
Anesthesia: General | Site: Wrist | Laterality: Right

## 2015-03-20 MED ORDER — KETOROLAC TROMETHAMINE 30 MG/ML IJ SOLN: 30.0000 mg | Freq: Once | INTRAMUSCULAR | Status: DC | PRN

## 2015-03-20 MED ORDER — MIDAZOLAM HCL 2 MG/2ML IJ SOLN
1.0000 mg | INTRAMUSCULAR | Status: DC | PRN
  Administered 2015-03-20: 2 mg via INTRAVENOUS

## 2015-03-20 MED ORDER — SENNA-DOCUSATE SODIUM 8.6-50 MG PO TABS: 2.0000 | ORAL_TABLET | Freq: Every day | ORAL | Status: DC

## 2015-03-20 MED ORDER — ACETAMINOPHEN 10 MG/ML IV SOLN
INTRAVENOUS | Status: AC
  Filled 2015-03-20: qty 100

## 2015-03-20 MED ORDER — FENTANYL CITRATE (PF) 100 MCG/2ML IJ SOLN: 25.0000 ug | INTRAMUSCULAR | Status: DC | PRN

## 2015-03-20 MED ORDER — FENTANYL CITRATE (PF) 100 MCG/2ML IJ SOLN
INTRAMUSCULAR | Status: AC
  Filled 2015-03-20: qty 6

## 2015-03-20 MED ORDER — MIDAZOLAM HCL 2 MG/2ML IJ SOLN
INTRAMUSCULAR | Status: AC
  Filled 2015-03-20: qty 2

## 2015-03-20 MED ORDER — ROPIVACAINE HCL 5 MG/ML IJ SOLN
INTRAMUSCULAR | Status: DC | PRN
  Administered 2015-03-20: 25 mL via PERINEURAL

## 2015-03-20 MED ORDER — SODIUM CHLORIDE 0.9 % IJ SOLN
INTRAMUSCULAR | Status: AC
  Filled 2015-03-20: qty 10

## 2015-03-20 MED ORDER — FENTANYL CITRATE (PF) 100 MCG/2ML IJ SOLN
INTRAMUSCULAR | Status: AC
  Filled 2015-03-20: qty 2

## 2015-03-20 MED ORDER — LIDOCAINE HCL (CARDIAC) 20 MG/ML IV SOLN
INTRAVENOUS | Status: DC | PRN
  Administered 2015-03-20: 80 mg via INTRAVENOUS

## 2015-03-20 MED ORDER — PROMETHAZINE HCL 25 MG/ML IJ SOLN
INTRAMUSCULAR | Status: AC
Start: 2015-03-20 — End: 2015-03-20
  Filled 2015-03-20: qty 1

## 2015-03-20 MED ORDER — FENTANYL CITRATE (PF) 100 MCG/2ML IJ SOLN
50.0000 ug | INTRAMUSCULAR | Status: DC | PRN
Start: 2015-03-20 — End: 2015-03-20
  Administered 2015-03-20: 100 ug via INTRAVENOUS

## 2015-03-20 MED ORDER — ONDANSETRON HCL 4 MG PO TABS: 4.0000 mg | ORAL_TABLET | Freq: Three times a day (TID) | ORAL | Status: DC | PRN

## 2015-03-20 MED ORDER — ACETAMINOPHEN 10 MG/ML IV SOLN
INTRAVENOUS | Status: DC | PRN
  Administered 2015-03-20: 1000 mg via INTRAVENOUS

## 2015-03-20 MED ORDER — OXYCODONE-ACETAMINOPHEN 10-325 MG PO TABS: 1.0000 | ORAL_TABLET | Freq: Four times a day (QID) | ORAL | Status: DC | PRN

## 2015-03-20 MED ORDER — PROMETHAZINE HCL 25 MG/ML IJ SOLN
6.2500 mg | INTRAMUSCULAR | Status: DC | PRN
  Administered 2015-03-20: 6.25 mg via INTRAVENOUS

## 2015-03-20 MED ORDER — LACTATED RINGERS IV SOLN
INTRAVENOUS | Status: DC
  Administered 2015-03-20: 10 mL/h via INTRAVENOUS
  Administered 2015-03-20: 10:00:00 via INTRAVENOUS

## 2015-03-20 MED ORDER — ONDANSETRON HCL 4 MG/2ML IJ SOLN
INTRAMUSCULAR | Status: DC | PRN
  Administered 2015-03-20: 4 mg via INTRAVENOUS

## 2015-03-20 MED ORDER — CEFAZOLIN SODIUM-DEXTROSE 2-3 GM-% IV SOLR
INTRAVENOUS | Status: AC
  Filled 2015-03-20: qty 50

## 2015-03-20 MED ORDER — DEXAMETHASONE SODIUM PHOSPHATE 4 MG/ML IJ SOLN
INTRAMUSCULAR | Status: DC | PRN
  Administered 2015-03-20: 10 mg via INTRAVENOUS

## 2015-03-20 MED ORDER — CEFAZOLIN SODIUM-DEXTROSE 2-3 GM-% IV SOLR
2.0000 g | INTRAVENOUS | Status: AC
  Administered 2015-03-20: 2 g via INTRAVENOUS

## 2015-03-20 MED ORDER — GLYCOPYRROLATE 0.2 MG/ML IJ SOLN: 0.2000 mg | Freq: Once | INTRAMUSCULAR | Status: DC | PRN

## 2015-03-20 MED ORDER — PROPOFOL 10 MG/ML IV BOLUS
INTRAVENOUS | Status: DC | PRN
Start: 2015-03-20 — End: 2015-03-20
  Administered 2015-03-20: 200 mg via INTRAVENOUS

## 2015-03-20 SURGICAL SUPPLY — 68 items
BANDAGE ELASTIC 3 VELCRO ST LF (GAUZE/BANDAGES/DRESSINGS) IMPLANT
BANDAGE ELASTIC 4 VELCRO ST LF (GAUZE/BANDAGES/DRESSINGS) ×1 IMPLANT
BIT DRILL 2 FAST STEP (BIT) ×1 IMPLANT
BIT DRILL 2.5X4 QC (BIT) ×1 IMPLANT
BLADE MINI RND TIP GREEN BEAV (BLADE) IMPLANT
BLADE SURG 15 STRL LF DISP TIS (BLADE) ×1 IMPLANT
BLADE SURG 15 STRL SS (BLADE) ×2
BNDG CMPR 9X4 STRL LF SNTH (GAUZE/BANDAGES/DRESSINGS) ×1
BNDG COHESIVE 4X5 TAN STRL (GAUZE/BANDAGES/DRESSINGS) ×1 IMPLANT
BNDG ESMARK 4X9 LF (GAUZE/BANDAGES/DRESSINGS) ×2 IMPLANT
CLSR STERI-STRIP ANTIMIC 1/2X4 (GAUZE/BANDAGES/DRESSINGS) ×2 IMPLANT
COVER BACK TABLE 60X90IN (DRAPES) ×2 IMPLANT
CUFF TOURNIQUET SINGLE 18IN (TOURNIQUET CUFF) IMPLANT
DECANTER SPIKE VIAL GLASS SM (MISCELLANEOUS) ×2 IMPLANT
DRAPE EXTREMITY T 121X128X90 (DRAPE) ×2 IMPLANT
DRAPE INCISE IOBAN 66X45 STRL (DRAPES) ×2 IMPLANT
DRAPE OEC MINIVIEW 54X84 (DRAPES) ×2 IMPLANT
DRAPE SURG 17X23 STRL (DRAPES) ×2 IMPLANT
DRAPE U 20/CS (DRAPES) ×2 IMPLANT
DURAPREP 26ML APPLICATOR (WOUND CARE) ×2 IMPLANT
ELECT REM PT RETURN 9FT ADLT (ELECTROSURGICAL) ×2
ELECTRODE REM PT RTRN 9FT ADLT (ELECTROSURGICAL) ×1 IMPLANT
GAUZE SPONGE 4X4 12PLY STRL (GAUZE/BANDAGES/DRESSINGS) ×2 IMPLANT
GLOVE BIO SURGEON STRL SZ 6.5 (GLOVE) ×2 IMPLANT
GLOVE BIOGEL PI IND STRL 7.0 (GLOVE) IMPLANT
GLOVE BIOGEL PI IND STRL 8 (GLOVE) ×2 IMPLANT
GLOVE BIOGEL PI INDICATOR 7.0 (GLOVE) ×3
GLOVE BIOGEL PI INDICATOR 8 (GLOVE) ×2
GLOVE ORTHO TXT STRL SZ7.5 (GLOVE) ×2 IMPLANT
GLOVE SURG ORTHO 8.0 STRL STRW (GLOVE) ×2 IMPLANT
GOWN STRL REUS W/ TWL LRG LVL3 (GOWN DISPOSABLE) ×1 IMPLANT
GOWN STRL REUS W/ TWL XL LVL3 (GOWN DISPOSABLE) ×2 IMPLANT
GOWN STRL REUS W/TWL LRG LVL3 (GOWN DISPOSABLE) ×4
GOWN STRL REUS W/TWL XL LVL3 (GOWN DISPOSABLE) ×4
NDL HYPO 25X1 1.5 SAFETY (NEEDLE) IMPLANT
NEEDLE HYPO 25X1 1.5 SAFETY (NEEDLE) IMPLANT
NS IRRIG 1000ML POUR BTL (IV SOLUTION) ×2 IMPLANT
PACK BASIN DAY SURGERY FS (CUSTOM PROCEDURE TRAY) ×2 IMPLANT
PAD CAST 3X4 CTTN HI CHSV (CAST SUPPLIES) IMPLANT
PAD CAST 4YDX4 CTTN HI CHSV (CAST SUPPLIES) IMPLANT
PADDING CAST ABS 4INX4YD NS (CAST SUPPLIES) ×1
PADDING CAST ABS COTTON 4X4 ST (CAST SUPPLIES) ×1 IMPLANT
PADDING CAST COTTON 3X4 STRL (CAST SUPPLIES)
PADDING CAST COTTON 4X4 STRL (CAST SUPPLIES) ×2
PEG DRIVER ×2 IMPLANT
PEG SUBCHONDRAL SMOOTH 2.0X16 (Peg) ×1 IMPLANT
PEG SUBCHONDRAL SMOOTH 2.0X18 (Peg) ×4 IMPLANT
PEG SUBCHONDRAL SMOOTH 2.0X20 (Peg) ×1 IMPLANT
PENCIL BUTTON HOLSTER BLD 10FT (ELECTRODE) ×2 IMPLANT
PLATE SHORT 21.6X48.9 NRRW RT (Plate) ×1 IMPLANT
SCREW BN 12X3.5XNS CORT TI (Screw) IMPLANT
SCREW CORT 3.5X12 (Screw) ×4 IMPLANT
SLEEVE SCD COMPRESS KNEE MED (MISCELLANEOUS) ×2 IMPLANT
SPLINT PLASTER CAST XFAST 3X15 (CAST SUPPLIES) IMPLANT
SPLINT PLASTER XTRA FASTSET 3X (CAST SUPPLIES)
STOCKINETTE 4X48 STRL (DRAPES) ×2 IMPLANT
SUCTION FRAZIER TIP 10 FR DISP (SUCTIONS) ×2 IMPLANT
SUT ETHILON 3 0 PS 1 (SUTURE) IMPLANT
SUT ETHILON 4 0 PS 2 18 (SUTURE) IMPLANT
SUT MNCRL AB 4-0 PS2 18 (SUTURE) IMPLANT
SUT VIC AB 0 CT1 27 (SUTURE)
SUT VIC AB 0 CT1 27XBRD ANBCTR (SUTURE) IMPLANT
SUT VICRYL 3-0 CR8 SH (SUTURE) ×2 IMPLANT
SYR BULB 3OZ (MISCELLANEOUS) ×2 IMPLANT
SYR CONTROL 10ML LL (SYRINGE) IMPLANT
TOWEL OR 17X24 6PK STRL BLUE (TOWEL DISPOSABLE) ×2 IMPLANT
TUBE CONNECTING 20X1/4 (TUBING) ×2 IMPLANT
UNDERPAD 30X30 (UNDERPADS AND DIAPERS) ×2 IMPLANT

## 2015-03-20 NOTE — Discharge Instructions (Signed)
Diet: As you were doing prior to hospitalization   Shower:  May shower but keep the wounds dry, use an occlusive plastic wrap, NO SOAKING IN TUB.  If the bandage gets wet, change with a clean dry gauze.  Dressing:  Keep splint intact.    Activity:  Increase activity slowly as tolerated, but follow the weight bearing instructions below.  No lifting or driving for 6 weeks.  Weight Bearing:   Sling as desired, no lifting with right hand, keep hand elevated, ok for finger motion as much as possible..    To prevent constipation: you may use a stool softener such as -  Colace (over the counter) 100 mg by mouth twice a day  Drink plenty of fluids (prune juice may be helpful) and high fiber foods Miralax (over the counter) for constipation as needed.    Itching:  If you experience itching with your medications, try taking only a single pain pill, or even half a pain pill at a time.  You may take up to 10 pain pills per day, and you can also use benadryl over the counter for itching or also to help with sleep.   Precautions:  If you experience chest pain or shortness of breath - call 911 immediately for transfer to the hospital emergency department!!  If you develop a fever greater that 101 F, purulent drainage from wound, increased redness or drainage from wound, or calf pain -- Call the office at [phone]                                                Follow- Up Appointment:  Please call for an appointment to be seen in 2 weeks [place] - [phone]  Regional Anesthesia Blocks  1. Numbness or the inability to move the "blocked" extremity may last from 3-48 hours after placement. The length of time depends on the medication injected and your individual response to the medication. If the numbness is not going away after 48 hours, call your surgeon.  2. The extremity that is blocked will need to be protected until the numbness is gone and the  Strength has returned. Because you cannot feel  it, you will need to take extra care to avoid injury. Because it may be weak, you may have difficulty moving it or using it. You may not know what position it is in without looking at it while the block is in effect.  3. For blocks in the legs and feet, returning to weight bearing and walking needs to be done carefully. You will need to wait until the numbness is entirely gone and the strength has returned. You should be able to move your leg and foot normally before you try and bear weight or walk. You will need someone to be with you when you first try to ensure you do not fall and possibly risk injury.  4. Bruising and tenderness at the needle site are common side effects and will resolve in a few days.  5. Persistent numbness or new problems with movement should be communicated to the surgeon or the [facility] [phone])/ [facility] [phone]).    Post Anesthesia Home Care Instructions  Activity: Get plenty of rest for the remainder of the day. A responsible adult should stay with you for 24 hours following the procedure.  For  the next 24 hours, DO NOT: -Drive a car -Advertising copywriterperate machinery -Drink alcoholic beverages -Take any medication unless instructed by your physician -Make any legal decisions or sign important papers.  Meals: Start with liquid foods such as gelatin or soup. Progress to regular foods as tolerated. Avoid greasy, spicy, heavy foods. If nausea and/or vomiting occur, drink only clear liquids until the nausea and/or vomiting subsides. Call your physician if vomiting continues.  Special Instructions/Symptoms: Your throat may feel dry or sore from the anesthesia or the breathing tube placed in your throat during surgery. If this causes discomfort, gargle with warm salt water. The discomfort should disappear within 24 hours.  If you had a scopolamine patch placed behind your ear for the management of post- operative nausea and/or  vomiting:  1. The medication in the patch is effective for 72 hours, after which it should be removed.  Wrap patch in a tissue and discard in the trash. Wash hands thoroughly with soap and water. 2. You may remove the patch earlier than 72 hours if you experience unpleasant side effects which may include dry mouth, dizziness or visual disturbances. 3. Avoid touching the patch. Wash your hands with soap and water after contact with the patch.

## 2015-03-20 NOTE — Transfer of Care (Signed)
Immediate Anesthesia Transfer of Care Note  Patient: [name]  Procedure(s) Performed: Procedure(s): OPEN REDUCTION INTERNAL FIXATION RIGHT DISTAL RADIUS (Right)  Patient Location: PACU  Anesthesia Type:GA combined with regional for post-op pain  Level of Consciousness: awake, oriented and patient cooperative  Airway & Oxygen Therapy: Patient Spontanous Breathing and Patient connected to face mask oxygen  Post-op Assessment: Report given to RN and Post -op Vital signs reviewed and stable  Post vital signs: Reviewed and stable  Last Vitals:  Filed Vitals:   03/20/15 1000  BP: 120/82  Pulse: 114  Temp:   Resp:     Complications: No apparent anesthesia complications

## 2015-03-20 NOTE — Anesthesia Procedure Notes (Signed)
Anesthesia Regional Block:  Supraclavicular block  Pre-Anesthetic Checklist: ,, timeout performed, Correct Patient, Correct Site, Correct Laterality, Correct Procedure, Correct Position, site marked, Risks and benefits discussed,  Surgical consent,  Pre-op evaluation,  At surgeon's request and post-op pain management  Laterality: Right  Prep: chloraprep       Needles:  Injection technique: Single-shot  Needle Type: Echogenic Stimulator Needle     Needle Length: 9cm 9 cm Needle Gauge: 21 and 21 G    Additional Needles:  Procedures: ultrasound guided (picture in chart) and nerve stimulator Supraclavicular block Narrative:  Injection made incrementally with aspirations every 5 mL.  Performed by: Personally   Additional Notes: Risks, benefits and alternative to block explained extensively.  Patient tolerated procedure well, without complications.

## 2015-03-20 NOTE — Progress Notes (Signed)
Assisted Dr. Rose with right, ultrasound guided, supraclavicular block. Side rails up, monitors on throughout procedure. See vital signs in flow sheet. Tolerated Procedure well. 

## 2015-03-20 NOTE — Anesthesia Preprocedure Evaluation (Signed)
Anesthesia Evaluation  Patient identified by MRN, date of birth, ID band Patient awake    Reviewed: Allergy & Precautions, NPO status , Patient's Chart, lab work & pertinent test results  History of Anesthesia Complications (+) PONV  Airway Mallampati: II  TM Distance: >3 FB Neck ROM: Full    Dental no notable dental hx.    Pulmonary neg pulmonary ROS,  breath sounds clear to auscultation  Pulmonary exam normal       Cardiovascular negative cardio ROS Normal cardiovascular examRhythm:Regular Rate:Normal     Neuro/Psych negative neurological ROS  negative psych ROS   GI/Hepatic Neg liver ROS, GERD-  Medicated,  Endo/Other  negative endocrine ROS  Renal/GU negative Renal ROS  negative genitourinary   Musculoskeletal negative musculoskeletal ROS (+)   Abdominal   Peds negative pediatric ROS (+)  Hematology negative hematology ROS (+)   Anesthesia Other Findings   Reproductive/Obstetrics negative OB ROS                             Anesthesia Physical Anesthesia Plan  ASA: II  Anesthesia Plan: General   Post-op Pain Management:    Induction: Intravenous  Airway Management Planned: LMA  Additional Equipment:   Intra-op Plan:   Post-operative Plan:   Informed Consent: I have reviewed the patients History and Physical, chart, labs and discussed the procedure including the risks, benefits and alternatives for the proposed anesthesia with the patient or authorized representative who has indicated his/her understanding and acceptance.   Dental advisory given  Plan Discussed with: CRNA and Surgeon  Anesthesia Plan Comments:         Anesthesia Quick Evaluation

## 2015-03-20 NOTE — H&P (Signed)
PREOPERATIVE H&P  Chief Complaint: Right wrist pain   HPI: [name] is a 29 y.o. female who presents for preoperative history and physical with a diagnosis of displaced right distal radius fracture . Symptoms are rated as moderate to severe, and have been worsening.  This is significantly impairing activities of daily living.  She has elected for surgical management. This happened May 9 after she fell off of a horse. She noted deformity, as well as pain to palpation.  Past Medical History  Diagnosis Date  . Anemia     no current med.  Marland Kitchen. History of cardiac murmur     states workup 2013 - no problems identified  . Radius fracture 03/16/2015    right  . GERD (gastroesophageal reflux disease)     OTC as needed   Past Surgical History  Procedure Laterality Date  . Cesarean section  03/15/2006   History   Social History  . Marital Status: Single    Spouse Name: N/A  . Number of Children: N/A  . Years of Education: N/A   Occupational History  . wants to go to [employer]acy school, in college now    Social History Main Topics  . Smoking status: Never Smoker   . Smokeless tobacco: Never Used  . Alcohol Use: Yes     Comment: occasionally  . Drug Use: No  . Sexual Activity: Yes    Birth Control/ Protection: Inserts   Other Topics Concern  . None   Social History Narrative   History reviewed. No pertinent family history. No Known Allergies Prior to Admission medications   Medication Sig Start Date End Date Taking? Authorizing Provider  cholecalciferol (VITAMIN D) 1000 UNITS tablet Take 1,000 Units by mouth daily.     Yes Historical Provider, MD  fexofenadine-pseudoephedrine (ALLEGRA-D 24) 180-240 MG per 24 hr tablet Take 1 tablet by mouth daily.   Yes Historical Provider, MD  omeprazole (PRILOSEC) 20 MG capsule Take 20 mg by mouth daily.   Yes Historical Provider, MD  oxyCODONE-acetaminophen (PERCOCET/ROXICET) 5-325 MG per tablet Take 1-2 tablets by mouth every 4 (four) hours as  needed for severe pain. 03/16/15  Yes Raeford RazorStephen Kohut, MD     Positive ROS: All other systems have been reviewed and were otherwise negative with the exception of those mentioned in the HPI and as above.  Physical Exam: General: Alert, no acute distress Cardiovascular: No pedal edema Respiratory: No cyanosis, no use of accessory musculature GI: No organomegaly, abdomen is soft and non-tender Skin: No lesions in the area of chief complaint Neurologic: Sensation intact distally Psychiatric: Patient is competent for consent with normal mood and affect Lymphatic: No axillary or cervical lymphadenopathy  MUSCULOSKELETAL: Right wrist has moderate soft tissue swelling and positive pain to palpation over the distal radius, sensation intact throughout the median nerve distribution. All fingers flex extend and abduct.  Assessment: Displaced right distal radius fracture, it appears to have at least 2 pieces.  Plan: Plan for Procedure(s): Closed reduction with percutaneous pinning versus open reduction and plate fixation  The risks benefits and alternatives were discussed with the patient including but not limited to the risks of nonoperative treatment, versus surgical intervention including infection, bleeding, nerve injury,  blood clots, cardiopulmonary complications, morbidity, mortality, among others, and they were willing to proceed.   Eulas PostLANDAU,Darlean Warmoth P, MD Cell [phone]   03/20/2015 7:19 AM

## 2015-03-20 NOTE — Anesthesia Postprocedure Evaluation (Signed)
  Anesthesia Post-op Note  Patient: [name]  Procedure(s) Performed: Procedure(s): OPEN REDUCTION INTERNAL FIXATION RIGHT DISTAL RADIUS (Right)  Patient Location: PACU  Anesthesia Type: General with regional   Level of Consciousness: awake, alert  and oriented  Airway and Oxygen Therapy: Patient Spontanous Breathing  Post-op Pain: none  Post-op Assessment: Post-op Vital signs reviewed  Post-op Vital Signs: Reviewed  Last Vitals:  Filed Vitals:   03/20/15 1230  BP: 138/85  Pulse: 108  Temp:   Resp: 16    Complications: No apparent anesthesia complications

## 2015-03-20 NOTE — Op Note (Signed)
03/20/2015  11:31 AM  PATIENT:  [name]    PRE-OPERATIVE DIAGNOSIS:  Right distal radius fracture, 2 pieces  POST-OPERATIVE DIAGNOSIS:  Same  PROCEDURE:  ORIF DISTAL RADIUS FRACTURE, 2 PIECES  SURGEON:  Eulas PostLANDAU,Ezel Vallone P, MD  PHYSICIAN ASSISTANT: Janace LittenBrandon Parry, OPA-C, present and scrubbed throughout the case, critical for completion in a timely fashion, and for retraction, instrumentation, and closure.  ANESTHESIA:   General  PREOPERATIVE INDICATIONS:  [name] is a  29 y.o. female with a diagnosis of displaced right radius fracture after falling off of a horse  who elected for surgical management due to fracture displacement.    The risks benefits and alternatives were discussed with the patient preoperatively including but not limited to the risks of infection, bleeding, nerve injury, cardiopulmonary complications, the need for revision surgery, tendon rupture, hardware prominence, hardware failure, nonunion, malunion, post-traumatic arthritis, regional pain syndrome, among others, and the patient was willing to proceed.  OPERATIVE IMPLANTS: Biomet DVR volar plate with 2 proximal cortical screws and multiple distal interlocking smooth pegs, using the short narrow plate.   OPERATIVE FINDINGS: Displaced right distal radius fracture, minimal intra-articular involvement if any.  OPERATIVE PROCEDURE: The patient was brought to the operating room and placed in the supine position. General anesthesia was administered. IV antibiotics were given. Time out was performed. The upper extremity was prepped and draped in usual sterile fashion. The arm was elevated and exsanguinated and the tourniquet was inflated at 250mm hg.    Volar approach to the distal radius was carried out, and the flexor carpi radialis was retracted radially. The radial artery was protected throughout the case.   Deep dissection was carried down, and the pronator quadratus was elevated off of the radius. The fracture  site was identified and cleaned and reduced anatomically. This keyed into place nicely.   I held this provisionally with a K wire, and C-arm used to confirm alignment.  I had restored height and inclination and then applied a volar plate. A K wire was used to confirm appropriate position of the plate, and once I was satisfied with the overall alignment I was able to secure the plate proximally with a cortical screw.   I then secured the fracture with multiple smooth interlocking pegs distally, and confirmed that none of these were in the joint, and none of these were penetrating the dorsal cortex. I also secured the plate proximally with one more cortical screw. On the ulnar side of the plate distally I used shorter pegs than on the radial side.    The wounds were irrigated copiously, and I repaired the pronator quadratus as well as possible with 3-0 Vicryl followed by 3-0 subcutaneous Vicryl for the skin and Steri-Strips and sterile gauze and a volar splint. The tourniquet was released. She was awakened and returned back in stable and satisfactory condition. There were no complications and She tolerated the procedure well.

## 2015-03-23 ENCOUNTER — Encounter (HOSPITAL_BASED_OUTPATIENT_CLINIC_OR_DEPARTMENT_OTHER): Payer: Self-pay | Admitting: Orthopedic Surgery

## 2015-03-25 NOTE — ED Provider Notes (Signed)
CSN: [id number]     Arrival date & time 03/16/15  1520 History   First MD Initiated Contact with Patient 03/16/15 1807     Chief Complaint  Patient presents with  . Wrist Injury     (Consider location/radiation/quality/duration/timing/severity/associated sxs/prior Treatment) HPI   29 year old female with right wrist pain after fall from a horse. Happened just before arrival. Denies pain anywhere else. No numbness or tingling. Deformity noted. No intervention prior to arrival.  Past Medical History  Diagnosis Date  . Anemia     no current med.  Marland Kitchen. History of cardiac murmur     states workup 2013 - no problems identified  . Radius fracture 03/16/2015    right  . GERD (gastroesophageal reflux disease)     OTC as needed  . Fracture of radius, distal, right, closed 03/20/2015   Past Surgical History  Procedure Laterality Date  . Cesarean section  03/15/2006  . Open reduction internal fixation (orif) distal radial fracture Right 03/20/2015    Procedure: OPEN REDUCTION INTERNAL FIXATION RIGHT DISTAL RADIUS;  Surgeon: Teryl LucyJoshua Landau, MD;  Location: [facility];  Service: Orthopedics;  Laterality: Right;   History reviewed. No pertinent family history. History  Substance Use Topics  . Smoking status: Never Smoker   . Smokeless tobacco: Never Used  . Alcohol Use: Yes     Comment: occasionally   OB History    No data available     Review of Systems  All systems reviewed and negative, other than as noted in HPI.   Allergies  Review of patient's allergies indicates no known allergies.  Home Medications   Prior to Admission medications   Medication Sig Start Date End Date Taking? Authorizing Provider  cholecalciferol (VITAMIN D) 1000 UNITS tablet Take 1,000 Units by mouth daily.     Yes Historical Provider, MD  fexofenadine-pseudoephedrine (ALLEGRA-D 24) 180-240 MG per 24 hr tablet Take 1 tablet by mouth daily.    Historical Provider, MD  omeprazole (PRILOSEC) 20 MG  capsule Take 20 mg by mouth daily.    Historical Provider, MD  ondansetron (ZOFRAN) 4 MG tablet Take 1 tablet (4 mg total) by mouth every 8 (eight) hours as needed for nausea or vomiting. 03/20/15   Teryl LucyJoshua Landau, MD  oxyCODONE-acetaminophen (PERCOCET) 10-325 MG per tablet Take 1-2 tablets by mouth every 6 (six) hours as needed for pain. MAXIMUM TOTAL ACETAMINOPHEN DOSE IS 4000 MG PER DAY 03/20/15   Teryl LucyJoshua Landau, MD  sennosides-docusate sodium (SENOKOT-S) 8.6-50 MG tablet Take 2 tablets by mouth daily. 03/20/15   Teryl LucyJoshua Landau, MD   BP 110/64 mmHg  Pulse 97  Temp(Src) 98.8 F (37.1 C) (Oral)  Resp 18  SpO2 100%  LMP 02/20/2015 (Approximate) Physical Exam  Constitutional: She appears well-developed and well-nourished. No distress.  HENT:  Head: Normocephalic and atraumatic.  Eyes: Conjunctivae are normal. Right eye exhibits no discharge. Left eye exhibits no discharge.  Neck: Neck supple.  Cardiovascular: Normal rate, regular rhythm and normal heart sounds.  Exam reveals no gallop and no friction rub.   No murmur heard. Pulmonary/Chest: Effort normal and breath sounds normal. No respiratory distress.  Abdominal: Soft. She exhibits no distension. There is no tenderness.  Musculoskeletal: She exhibits edema and tenderness.  Tenderness and swelling distal right wrist. He will wiggle fingers. Cap refill is intact in fingers. Sensation is intact to light touch. No areas concerning for an open injury.  Neurological: She is alert.  Skin: Skin is warm and dry.  Psychiatric: She has a normal mood and affect. Her behavior is normal. Thought content normal.  Nursing note and vitals reviewed.   ED Course  Procedures (including critical care time) Labs Review Labs Reviewed - No data to display  Imaging Review No results found.   Dg Wrist Complete Right  03/16/2015   CLINICAL DATA:  Right wrist injury, fell off horse  EXAM: RIGHT WRIST - COMPLETE 3+ VIEW  COMPARISON:  None.  FINDINGS: Four views  of the right wrist submitted. There is displaced mild angulated fracture in distal right radial metaphysis  IMPRESSION: Mild displaced angulated fracture in distal right radial metaphysis. Best seen on lateral view.   Electronically Signed   By: Natasha MeadLiviu  Pop M.D.   On: 03/16/2015 16:23     EKG Interpretation None      MDM   Final diagnoses:  Radius distal fracture, right, closed, initial encounter    29 year old female with closed right distal radius fracture. Neurovascular intact. Splint. As needed pain medication. Orthopedic follow-up.     Raeford RazorStephen Acey [place]field, MD 03/25/15 [id number]

## 2015-06-24 ENCOUNTER — Encounter: Payer: Self-pay | Admitting: Internal Medicine

## 2015-06-24 ENCOUNTER — Ambulatory Visit (INDEPENDENT_AMBULATORY_CARE_PROVIDER_SITE_OTHER): Payer: 59 | Admitting: Internal Medicine

## 2015-06-24 VITALS — BP 128/82 | HR 83 | Temp 98.5°F | Resp 16 | Wt 164.0 lb

## 2015-06-24 DIAGNOSIS — T63463S Toxic effect of venom of wasps, assault, sequela: Secondary | ICD-10-CM

## 2015-06-24 DIAGNOSIS — T63461A Toxic effect of venom of wasps, accidental (unintentional), initial encounter: Secondary | ICD-10-CM | POA: Insufficient documentation

## 2015-06-24 DIAGNOSIS — J301 Allergic rhinitis due to pollen: Secondary | ICD-10-CM

## 2015-06-24 MED ORDER — TRIAMCINOLONE ACETONIDE 0.5 % EX CREA: 1.0000 "application " | TOPICAL_CREAM | Freq: Four times a day (QID) | CUTANEOUS | Status: DC

## 2015-06-24 MED ORDER — METHYLPREDNISOLONE ACETATE 80 MG/ML IJ SUSP
80.0000 mg | Freq: Once | INTRAMUSCULAR | Status: AC
  Administered 2015-06-24: 80 mg via INTRAMUSCULAR

## 2015-06-24 NOTE — Patient Instructions (Signed)
Allegra D Benadryl Cold compress Call if not well!

## 2015-06-24 NOTE — Progress Notes (Signed)
Pre visit review using our clinic review tool, if applicable. No additional management support is needed unless otherwise documented below in the visit note. 

## 2015-06-24 NOTE — Progress Notes (Signed)
Subjective:  Patient ID: [name], female    DOB: Feb 20, 1986  Age: 29 y.o. MRN: [id number]  CC: No chief complaint on file.   HPI [name] presents for yellow jackets stings on Sun x 6-7 times. C/o itching and swelling - better overall..left calf site has a few blisters on it... No resp sx's  Outpatient Prescriptions Prior to Visit  Medication Sig Dispense Refill  . cholecalciferol (VITAMIN D) 1000 UNITS tablet Take 1,000 Units by mouth daily.      . fexofenadine-pseudoephedrine (ALLEGRA-D 24) 180-240 MG per 24 hr tablet Take 1 tablet by mouth daily.    . sennosides-docusate sodium (SENOKOT-S) 8.6-50 MG tablet Take 2 tablets by mouth daily. 30 tablet 1  . omeprazole (PRILOSEC) 20 MG capsule Take 20 mg by mouth daily.    . ondansetron (ZOFRAN) 4 MG tablet Take 1 tablet (4 mg total) by mouth every 8 (eight) hours as needed for nausea or vomiting. 30 tablet 0  . oxyCODONE-acetaminophen (PERCOCET) 10-325 MG per tablet Take 1-2 tablets by mouth every 6 (six) hours as needed for pain. MAXIMUM TOTAL ACETAMINOPHEN DOSE IS 4000 MG PER DAY 75 tablet 0   No facility-administered medications prior to visit.    ROS Review of Systems  Constitutional: Negative for chills, diaphoresis, activity change, appetite change, fatigue and unexpected weight change.  HENT: Negative for congestion, mouth sores and sinus pressure.   Eyes: Negative for visual disturbance.  Respiratory: Negative for apnea, cough, chest tightness, shortness of breath, wheezing and stridor.   Gastrointestinal: Negative for nausea and abdominal pain.  Genitourinary: Negative for frequency, difficulty urinating and vaginal pain.  Musculoskeletal: Negative for back pain and gait problem.  Skin: Positive for rash. Negative for pallor.  Neurological: Negative for dizziness, tremors, weakness, numbness and headaches.  Psychiatric/Behavioral: Negative for confusion and sleep disturbance.    Objective:  BP 128/82 mmHg   Pulse 83  Temp(Src) 98.5 F (36.9 C) (Oral)  Resp 16  Wt 164 lb (74.39 kg)  SpO2 97%  BP Readings from Last 3 Encounters:  06/24/15 128/82  03/20/15 138/85  03/16/15 110/64    Wt Readings from Last 3 Encounters:  06/24/15 164 lb (74.39 kg)  03/20/15 170 lb 3.2 oz (77.202 kg)  03/06/15 168 lb 12.8 oz (76.567 kg)    Physical Exam  Constitutional: She appears well-developed. No distress.  HENT:  Head: Normocephalic.  Right Ear: External ear normal.  Left Ear: External ear normal.  Nose: Nose normal.  Mouth/Throat: Oropharynx is clear and moist.  Eyes: Conjunctivae are normal. Pupils are equal, round, and reactive to light. Right eye exhibits no discharge. Left eye exhibits no discharge.  Neck: Normal range of motion. Neck supple. No JVD present. No tracheal deviation present. No thyromegaly present.  Cardiovascular: Normal rate, regular rhythm and normal heart sounds.   Pulmonary/Chest: No stridor. No respiratory distress. She has no wheezes.  Abdominal: Soft. Bowel sounds are normal. She exhibits no distension and no mass. There is no tenderness. There is no rebound and no guarding.  Musculoskeletal: She exhibits no edema or tenderness.  Lymphadenopathy:    She has no cervical adenopathy.  Neurological: She displays normal reflexes. No cranial nerve deficit. She exhibits normal muscle tone. Coordination normal.  Skin: Rash noted. There is erythema.  Psychiatric: She has a normal mood and affect. Her behavior is normal. Judgment and thought content normal.  swollen, eryth warm tender areas of 2x4 cm; one lesion on L posterior leg w/blisters  Lab Results  Component Value Date   WBC 9.9 11/18/2013   HGB 10.3* 03/20/2015   HCT 32.8* 11/18/2013   PLT 390.0 11/18/2013   GLUCOSE 86 11/18/2013   ALT 13 11/18/2013   AST 18 11/18/2013   NA 139 11/18/2013   K 4.3 11/18/2013   CL 106 11/18/2013   CREATININE 0.8 11/18/2013   BUN 12 11/18/2013   CO2 27 11/18/2013   TSH 1.74  11/18/2013    No results found.  Assessment & Plan:   There are no diagnoses linked to this encounter. I have discontinued Ms. Soroka's omeprazole, oxyCODONE-acetaminophen, and ondansetron. I am also having her maintain her cholecalciferol, fexofenadine-pseudoephedrine, and sennosides-docusate sodium.  No orders of the defined types were placed in this encounter.     Follow-up: No Follow-up on file.  Sonda Primes, MD

## 2015-06-24 NOTE — Assessment & Plan Note (Addendum)
Depo-medrol 80 mg im Allegra D Benadryl Kenalog cream qid   Risks/benefits of steroids discussed

## 2015-06-25 ENCOUNTER — Encounter: Payer: Self-pay | Admitting: Internal Medicine

## 2015-06-25 NOTE — Assessment & Plan Note (Addendum)
Allegra-D prn

## 2015-07-28 ENCOUNTER — Telehealth: Payer: Self-pay | Admitting: Internal Medicine

## 2015-07-28 DIAGNOSIS — Z01419 Encounter for gynecological examination (general) (routine) without abnormal findings: Secondary | ICD-10-CM

## 2015-07-28 NOTE — Telephone Encounter (Signed)
Ok Thx 

## 2015-07-28 NOTE — Telephone Encounter (Signed)
Patient is requesting a referral to the [facility] Dept for yearly OBGYN visit. Nurse's name is Tobi Bastos # [phone]. She has an appt for 09/16/2015.

## 2015-07-29 NOTE — Telephone Encounter (Signed)
Called pt no answer [facility] referral has been place.../lmb 

## 2016-01-13 ENCOUNTER — Ambulatory Visit (INDEPENDENT_AMBULATORY_CARE_PROVIDER_SITE_OTHER): Payer: BLUE CROSS/BLUE SHIELD | Admitting: Nurse Practitioner

## 2016-01-13 ENCOUNTER — Encounter: Payer: Self-pay | Admitting: Nurse Practitioner

## 2016-01-13 VITALS — BP 102/60 | HR 88 | Temp 98.6°F | Wt 169.0 lb

## 2016-01-13 DIAGNOSIS — J069 Acute upper respiratory infection, unspecified: Secondary | ICD-10-CM | POA: Diagnosis not present

## 2016-01-13 MED ORDER — AMOXICILLIN-POT CLAVULANATE 875-125 MG PO TABS: 1.0000 | ORAL_TABLET | Freq: Two times a day (BID) | ORAL | Status: DC

## 2016-01-13 MED ORDER — HYDROCOD POLST-CPM POLST ER 10-8 MG/5ML PO SUER: 5.0000 mL | Freq: Every evening | ORAL | Status: DC | PRN

## 2016-01-13 NOTE — Addendum Note (Signed)
Addended by: Carollee LeitzSS, Dvonte Gatliff M on: 01/13/2016 01:24 PM   Modules accepted: Kipp BroodSmartSet

## 2016-01-13 NOTE — Progress Notes (Signed)
Pre visit review using our clinic review tool, if applicable. No additional management support is needed unless otherwise documented below in the visit note. 

## 2016-01-13 NOTE — Progress Notes (Signed)
Patient ID: [name], female    DOB: 08/10/1986  Age: 30 y.o. MRN: [id number]  CC: Sore Throat and Cough   HPI [name] presents for CC of ST x 7 days and cough.   1) Pt reports 7 days of a ST that moved to congestion Cough is productive x 2 days with green mucous  Denies fevers, but felt warm one night Cough is worse at night  Treatment to date: Robitussin, cough drops, ibuprofen, Tea ect...  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   She has past surgical history that includes Cesarean section (03/15/2006) and Open reduction internal fixation (orif) distal radial fracture (Right, 03/20/2015).   Her family history is not on file.She reports that she has never smoked. She has never used smokeless tobacco. She reports that she drinks alcohol. She reports that she does not use illicit drugs.  Outpatient Prescriptions Prior to Visit  Medication Sig Dispense Refill  . cholecalciferol (VITAMIN D) 1000 UNITS tablet Take 1,000 Units by mouth daily.      . fexofenadine-pseudoephedrine (ALLEGRA-D 24) 180-240 MG per 24 hr tablet Take 1 tablet by mouth daily.    . sennosides-docusate sodium (SENOKOT-S) 8.6-50 MG tablet Take 2 tablets by mouth daily. 30 tablet 1  . triamcinolone cream (KENALOG) 0.5 % Apply 1 application topically 4 (four) times daily. 30 g 1   No facility-administered medications prior to visit.    ROS Review of Systems  Constitutional: Positive for diaphoresis and fatigue. Negative for fever and chills.  HENT: Positive for congestion, postnasal drip, rhinorrhea, sinus pressure and sore throat. Negative for ear pain.   Respiratory: Positive for cough. Negative for chest tightness, shortness of breath and wheezing.   Cardiovascular:  Negative for chest pain, palpitations and leg swelling.  Gastrointestinal: Negative for nausea, vomiting and diarrhea.  Skin: Negative for rash.  Neurological: Negative for dizziness and headaches.   Objective:  BP 102/60 mmHg  Pulse 88  Temp(Src) 98.6 F (37 C) (Oral)  Wt 169 lb (76.658 kg)  SpO2 97%  Physical Exam  Constitutional: She is oriented to person, place, and time. She appears well-developed and well-nourished. No distress.  HENT:  Head: Normocephalic and atraumatic.  Right Ear: External ear normal.  Left Ear: External ear normal.  Mouth/Throat: No oropharyngeal exudate.  TM's clear bilaterally without retraction or bulging  Eyes: EOM are normal. Pupils are equal, round, and reactive to light. Right eye exhibits no discharge. Left eye exhibits no discharge. No scleral icterus.  Cardiovascular: Normal rate, regular rhythm and normal heart sounds.  Exam reveals no gallop and no friction rub.   No murmur heard. Pulmonary/Chest: Effort normal and breath sounds normal. No respiratory distress. She has no wheezes. She has no rales. She exhibits no tenderness.  Neurological: She is alert and oriented to person, place, and time. No cranial nerve deficit. She exhibits normal muscle tone. Coordination normal.  Skin: Skin is warm and dry. No rash noted. She is not diaphoretic.  Psychiatric: She has a normal mood and affect. Her behavior is normal. Judgment and thought content normal.   Assessment & Plan:   Alvis LemmingsDawn was seen today for sore throat and cough.  Diagnoses and all orders for this visit:  Acute URI  Other orders -  chlorpheniramine-HYDROcodone (TUSSIONEX PENNKINETIC ER) 10-8 MG/5ML SUER; Take 5 mLs by mouth at bedtime as needed for cough. -     amoxicillin-clavulanate (AUGMENTIN) 875-125 MG tablet; Take 1 tablet by mouth 2 (two) times daily.  I am having [name] start on chlorpheniramine-HYDROcodone and amoxicillin-clavulanate. I am also having her maintain her  cholecalciferol, fexofenadine-pseudoephedrine, sennosides-docusate sodium, and triamcinolone cream.  Meds ordered this encounter  Medications  . chlorpheniramine-HYDROcodone (TUSSIONEX PENNKINETIC ER) 10-8 MG/5ML SUER    Sig: Take 5 mLs by mouth at bedtime as needed for cough.    Dispense:  115 mL    Refill:  0    Order Specific Question:  Supervising Provider    Answer:  Darrick Huntsman, TERESA L [[id number]]  . amoxicillin-clavulanate (AUGMENTIN) 875-125 MG tablet    Sig: Take 1 tablet by mouth 2 (two) times daily.    Dispense:  14 tablet    Refill:  0    Order Specific Question:  Supervising Provider    Answer:  Sherlene Shams [[id number]]     Follow-up: Return if symptoms worsen or fail to improve.

## 2016-01-13 NOTE — Patient Instructions (Addendum)
You should continue to improve over the next 7 days.   Call us if worsens or fails in improved after 1 week.

## 2016-01-13 NOTE — Assessment & Plan Note (Addendum)
New onset Due to length of symptoms with worsening will treat empirically  Augmentin was sent to the pharmacy [facility]  Continue OTC measures  Tussionex printed and given to pt to take to pharmacy  FU prn worsening/failure to improve.

## 2016-01-18 ENCOUNTER — Ambulatory Visit: Payer: Self-pay | Admitting: Internal Medicine

## 2016-02-02 ENCOUNTER — Ambulatory Visit: Payer: Self-pay | Admitting: Obstetrics

## 2016-02-11 ENCOUNTER — Telehealth: Payer: Self-pay | Admitting: Internal Medicine

## 2016-02-11 MED ORDER — PROMETHAZINE HCL 12.5 MG PO TABS: 12.5000 mg | ORAL_TABLET | Freq: Three times a day (TID) | ORAL | Status: DC | PRN

## 2016-02-11 NOTE — Telephone Encounter (Signed)
Ok promethazine OV if problems

## 2016-02-11 NOTE — Telephone Encounter (Signed)
Patient would like something called in for nausea 

## 2016-02-12 NOTE — Telephone Encounter (Signed)
Pt advised via home VM 

## 2016-03-16 ENCOUNTER — Ambulatory Visit: Payer: Self-pay | Admitting: Obstetrics

## 2016-04-07 ENCOUNTER — Ambulatory Visit: Payer: Self-pay | Admitting: Obstetrics

## 2016-06-08 ENCOUNTER — Ambulatory Visit: Payer: Self-pay | Admitting: Obstetrics

## 2016-06-08 ENCOUNTER — Other Ambulatory Visit: Payer: Self-pay | Admitting: Internal Medicine

## 2016-07-14 ENCOUNTER — Other Ambulatory Visit: Payer: Self-pay | Admitting: Internal Medicine

## 2016-08-03 ENCOUNTER — Encounter: Payer: Self-pay | Admitting: Internal Medicine

## 2016-08-03 ENCOUNTER — Other Ambulatory Visit (INDEPENDENT_AMBULATORY_CARE_PROVIDER_SITE_OTHER): Payer: BLUE CROSS/BLUE SHIELD

## 2016-08-03 ENCOUNTER — Ambulatory Visit (INDEPENDENT_AMBULATORY_CARE_PROVIDER_SITE_OTHER): Payer: BLUE CROSS/BLUE SHIELD | Admitting: Internal Medicine

## 2016-08-03 VITALS — BP 110/60 | HR 80 | Temp 98.6°F | Wt 169.0 lb

## 2016-08-03 DIAGNOSIS — R5382 Chronic fatigue, unspecified: Secondary | ICD-10-CM

## 2016-08-03 DIAGNOSIS — K5909 Other constipation: Secondary | ICD-10-CM | POA: Diagnosis not present

## 2016-08-03 DIAGNOSIS — L659 Nonscarring hair loss, unspecified: Secondary | ICD-10-CM

## 2016-08-03 DIAGNOSIS — E559 Vitamin D deficiency, unspecified: Secondary | ICD-10-CM | POA: Diagnosis not present

## 2016-08-03 LAB — BASIC METABOLIC PANEL
BUN: 9 mg/dL (ref 6–23)
CHLORIDE: 104 meq/L (ref 96–112)
CO2: 26 meq/L (ref 19–32)
CREATININE: 0.63 mg/dL (ref 0.40–1.20)
Calcium: 8.9 mg/dL (ref 8.4–10.5)
GFR: 143.61 mL/min (ref >60.00)
GLUCOSE: 86 mg/dL (ref 70–99)
Potassium: 3.4 mEq/L — ABNORMAL LOW (ref 3.5–5.1)
Sodium: 136 mEq/L (ref 135–145)

## 2016-08-03 LAB — CBC WITH DIFFERENTIAL/PLATELET
BASOS PCT: 0.6 % (ref 0.0–3.0)
Basophils Absolute: 0.1 10*3/uL (ref 0.0–0.1)
EOS ABS: 0.4 10*3/uL (ref 0.0–0.7)
Eosinophils Relative: 3.7 % (ref 0.0–5.0)
HCT: 30.9 % — ABNORMAL LOW (ref 36.0–46.0)
Hemoglobin: 9.9 g/dL — ABNORMAL LOW (ref 12.0–15.0)
LYMPHS ABS: 3.6 10*3/uL (ref 0.7–4.0)
Lymphocytes Relative: 34.3 % (ref 12.0–46.0)
MCHC: 32.1 g/dL (ref 30.0–36.0)
MCV: 73 fl — ABNORMAL LOW (ref 78.0–100.0)
MONO ABS: 0.8 10*3/uL (ref 0.1–1.0)
Monocytes Relative: 8.1 % (ref 3.0–12.0)
NEUTROS ABS: 5.5 10*3/uL (ref 1.4–7.7)
Neutrophils Relative %: 53.3 % (ref 43.0–77.0)
PLATELETS: 343 10*3/uL (ref 150.0–400.0)
RBC: 4.23 Mil/uL (ref 3.87–5.11)
RDW: 17.1 % — AB (ref 11.5–15.5)
WBC: 10.4 10*3/uL (ref 4.0–10.5)

## 2016-08-03 LAB — HEPATIC FUNCTION PANEL
ALT: 8 U/L (ref 0–35)
AST: 13 U/L (ref 0–37)
Albumin: 3.9 g/dL (ref 3.5–5.2)
Alkaline Phosphatase: 50 U/L (ref 39–117)
BILIRUBIN DIRECT: 0 mg/dL (ref 0.0–0.3)
BILIRUBIN TOTAL: 0.3 mg/dL (ref 0.2–1.2)
Total Protein: 7.3 g/dL (ref 6.0–8.3)

## 2016-08-03 LAB — URINALYSIS
Hgb urine dipstick: NEGATIVE
Ketones, ur: 15 — AB
LEUKOCYTES UA: NEGATIVE
NITRITE: NEGATIVE
PH: 6 (ref 5.0–8.0)
Total Protein, Urine: NEGATIVE
Urine Glucose: NEGATIVE
Urobilinogen, UA: 0.2 (ref 0.0–1.0)

## 2016-08-03 LAB — VITAMIN B12: Vitamin B-12: 479 pg/mL (ref 211–911)

## 2016-08-03 LAB — TSH: TSH: 1.18 u[IU]/mL (ref 0.35–4.50)

## 2016-08-03 NOTE — Assessment & Plan Note (Signed)
Linz[place] 

## 2016-08-03 NOTE — Assessment & Plan Note (Signed)
Labs

## 2016-08-03 NOTE — Progress Notes (Signed)
Subjective:  Patient ID: [name], female    DOB: 1985/11/19  Age: 30 y.o. MRN: [id number]  CC: No chief complaint on file.   HPI JENNE SELLINGER presents for fatigue, hair loss, occ CP x weeks. F/u anemia; heavy periods  Outpatient Medications Prior to Visit  Medication Sig Dispense Refill  . cholecalciferol (VITAMIN D) 1000 UNITS tablet Take 1,000 Units by mouth daily.      . fexofenadine-pseudoephedrine (ALLEGRA-D 24) 180-240 MG per 24 hr tablet Take 1 tablet by mouth daily.    . promethazine (PHENERGAN) 12.5 MG tablet Take 1 tablet (12.5 mg total) by mouth every 8 (eight) hours as needed for nausea or vomiting. 20 tablet 0  . sennosides-docusate sodium (SENOKOT-S) 8.6-50 MG tablet Take 2 tablets by mouth daily. 30 tablet 1  . triamcinolone cream (KENALOG) 0.5 % APPLY TO AFFECTED AREA TOPICALLY 4 TIMES A DAY 30 g 0  . amoxicillin-clavulanate (AUGMENTIN) 875-125 MG tablet Take 1 tablet by mouth 2 (two) times daily. 14 tablet 0  . chlorpheniramine-HYDROcodone (TUSSIONEX PENNKINETIC ER) 10-8 MG/5ML SUER Take 5 mLs by mouth at bedtime as needed for cough. 115 mL 0   No facility-administered medications prior to visit.     ROS Review of Systems  Constitutional: Positive for fatigue. Negative for activity change, appetite change, chills and unexpected weight change.  HENT: Negative for congestion, mouth sores and sinus pressure.   Eyes: Negative for visual disturbance.  Respiratory: Negative for cough and chest tightness.   Cardiovascular: Positive for chest pain.  Gastrointestinal: Negative for abdominal pain and nausea.  Genitourinary: Negative for difficulty urinating, frequency and vaginal pain.  Musculoskeletal: Negative for back pain and gait problem.  Skin: Negative for pallor and rash.  Neurological: Negative for dizziness, tremors, weakness, numbness and headaches.  Psychiatric/Behavioral: Negative for confusion and sleep disturbance.    Objective:  BP 110/60   Pulse  80   Temp 98.6 F (37 C) (Oral)   Wt 169 lb (76.7 kg)   SpO2 99%   BMI 33.01 kg/m   BP Readings from Last 3 Encounters:  08/03/16 110/60  01/13/16 102/60  06/24/15 128/82    Wt Readings from Last 3 Encounters:  08/03/16 169 lb (76.7 kg)  01/13/16 169 lb (76.7 kg)  06/24/15 164 lb (74.4 kg)    Physical Exam  Constitutional: She appears well-developed. No distress.  HENT:  Head: Normocephalic.  Right Ear: External ear normal.  Left Ear: External ear normal.  Nose: Nose normal.  Mouth/Throat: Oropharynx is clear and moist.  Eyes: Conjunctivae are normal. Pupils are equal, round, and reactive to light. Right eye exhibits no discharge. Left eye exhibits no discharge.  Neck: Normal range of motion. Neck supple. No JVD present. No tracheal deviation present. No thyromegaly present.  Cardiovascular: Normal rate, regular rhythm and normal heart sounds.   Pulmonary/Chest: No stridor. No respiratory distress. She has no wheezes.  Abdominal: Soft. Bowel sounds are normal. She exhibits no distension and no mass. There is no tenderness. There is no rebound and no guarding.  Musculoskeletal: She exhibits no edema or tenderness.  Lymphadenopathy:    She has no cervical adenopathy.  Neurological: She displays normal reflexes. No cranial nerve deficit. She exhibits normal muscle tone. Coordination normal.  Skin: No rash noted. No erythema.  Psychiatric: She has a normal mood and affect. Her behavior is normal. Judgment and thought content normal.    Lab Results  Component Value Date   WBC 9.9 11/18/2013  HGB 10.3 (L) 03/20/2015   HCT 32.8 (L) 11/18/2013   PLT 390.0 11/18/2013   GLUCOSE 86 11/18/2013   ALT 13 11/18/2013   AST 18 11/18/2013   NA 139 11/18/2013   K 4.3 11/18/2013   CL 106 11/18/2013   CREATININE 0.8 11/18/2013   BUN 12 11/18/2013   CO2 27 11/18/2013   TSH 1.74 11/18/2013    No results found.  Assessment & Plan:   There are no diagnoses linked to this  encounter. I have discontinued Ms. Ringwald's chlorpheniramine-HYDROcodone and amoxicillin-clavulanate. I am also having her maintain her cholecalciferol, fexofenadine-pseudoephedrine, sennosides-docusate sodium, promethazine, and triamcinolone cream.  No orders of the defined types were placed in this encounter.    Follow-up: No Follow-up on file.  Sonda PrimesAlex Demetres Prochnow, MD

## 2016-08-03 NOTE — Progress Notes (Signed)
Pre visit review using our clinic review tool, if applicable. No additional management support is needed unless otherwise documented below in the visit note. 

## 2016-08-05 ENCOUNTER — Encounter: Payer: Self-pay | Admitting: Internal Medicine

## 2016-08-09 DIAGNOSIS — Z23 Encounter for immunization: Secondary | ICD-10-CM | POA: Diagnosis not present

## 2016-08-19 ENCOUNTER — Ambulatory Visit: Payer: BLUE CROSS/BLUE SHIELD | Admitting: Obstetrics

## 2016-08-25 ENCOUNTER — Telehealth: Payer: Self-pay | Admitting: *Deleted

## 2016-08-25 DIAGNOSIS — Z30015 Encounter for initial prescription of vaginal ring hormonal contraceptive: Secondary | ICD-10-CM

## 2016-08-25 DIAGNOSIS — D509 Iron deficiency anemia, unspecified: Secondary | ICD-10-CM

## 2016-08-25 MED ORDER — CITRANATAL HARMONY 27-1-260 MG PO CAPS: 1.0000 | ORAL_CAPSULE | Freq: Every day | ORAL | 11 refills | Status: DC

## 2016-08-25 MED ORDER — ETONOGESTREL-ETHINYL ESTRADIOL 0.12-0.015 MG/24HR VA RING: VAGINAL_RING | VAGINAL | 12 refills | Status: DC

## 2016-08-25 NOTE — Telephone Encounter (Signed)
Patient has an appointment tomorrow for her annual exam. She ended a pregnancy a week ago and she is still bleeding. She is concerned about her iron level and she needs to be on birth control. Told patient she could expect bleeding up to 4 weeks after her procedure. She should wait and schedule her appointment next month. Offered patient Rx for [employer] and Prenatal with iron until her appointment.

## 2016-08-26 ENCOUNTER — Ambulatory Visit: Payer: Self-pay | Admitting: Obstetrics

## 2016-08-29 ENCOUNTER — Encounter: Payer: Self-pay | Admitting: Internal Medicine

## 2016-09-06 ENCOUNTER — Encounter: Payer: Self-pay | Admitting: Internal Medicine

## 2016-09-06 ENCOUNTER — Other Ambulatory Visit: Payer: Self-pay | Admitting: Internal Medicine

## 2016-09-06 DIAGNOSIS — D500 Iron deficiency anemia secondary to blood loss (chronic): Secondary | ICD-10-CM

## 2016-09-09 ENCOUNTER — Other Ambulatory Visit (INDEPENDENT_AMBULATORY_CARE_PROVIDER_SITE_OTHER): Payer: BLUE CROSS/BLUE SHIELD

## 2016-09-09 DIAGNOSIS — R5382 Chronic fatigue, unspecified: Secondary | ICD-10-CM | POA: Diagnosis not present

## 2016-09-09 DIAGNOSIS — E559 Vitamin D deficiency, unspecified: Secondary | ICD-10-CM | POA: Diagnosis not present

## 2016-09-09 DIAGNOSIS — K5909 Other constipation: Secondary | ICD-10-CM

## 2016-09-09 LAB — VITAMIN D 25 HYDROXY (VIT D DEFICIENCY, FRACTURES): VITD: 14.3 ng/mL — AB (ref 30.00–100.00)

## 2016-09-09 LAB — IBC PANEL
IRON: 16 ug/dL — AB (ref 42–145)
Saturation Ratios: 3.4 % — ABNORMAL LOW (ref 20.0–50.0)
TRANSFERRIN: 334 mg/dL (ref 212.0–360.0)

## 2016-09-12 MED ORDER — ERGOCALCIFEROL 1.25 MG (50000 UT) PO CAPS: 50000.0000 [IU] | ORAL_CAPSULE | ORAL | 0 refills | Status: DC

## 2016-09-15 ENCOUNTER — Other Ambulatory Visit: Payer: Self-pay | Admitting: Family

## 2016-09-15 DIAGNOSIS — N920 Excessive and frequent menstruation with regular cycle: Secondary | ICD-10-CM

## 2016-09-15 DIAGNOSIS — D500 Iron deficiency anemia secondary to blood loss (chronic): Secondary | ICD-10-CM

## 2016-09-16 ENCOUNTER — Ambulatory Visit: Payer: BLUE CROSS/BLUE SHIELD

## 2016-09-16 ENCOUNTER — Other Ambulatory Visit (HOSPITAL_BASED_OUTPATIENT_CLINIC_OR_DEPARTMENT_OTHER): Payer: BLUE CROSS/BLUE SHIELD

## 2016-09-16 ENCOUNTER — Encounter: Payer: Self-pay | Admitting: Family

## 2016-09-16 ENCOUNTER — Ambulatory Visit (HOSPITAL_BASED_OUTPATIENT_CLINIC_OR_DEPARTMENT_OTHER): Payer: BLUE CROSS/BLUE SHIELD

## 2016-09-16 ENCOUNTER — Ambulatory Visit (HOSPITAL_BASED_OUTPATIENT_CLINIC_OR_DEPARTMENT_OTHER): Payer: BLUE CROSS/BLUE SHIELD | Admitting: Family

## 2016-09-16 VITALS — BP 115/64 | HR 73 | Temp 98.5°F | Resp 16 | Ht 60.0 in | Wt 168.4 lb

## 2016-09-16 DIAGNOSIS — N920 Excessive and frequent menstruation with regular cycle: Secondary | ICD-10-CM

## 2016-09-16 DIAGNOSIS — D500 Iron deficiency anemia secondary to blood loss (chronic): Secondary | ICD-10-CM | POA: Insufficient documentation

## 2016-09-16 DIAGNOSIS — D560 Alpha thalassemia: Secondary | ICD-10-CM

## 2016-09-16 LAB — CBC WITH DIFFERENTIAL (CANCER CENTER ONLY)
BASO#: 0.1 10*3/uL (ref 0.0–0.2)
BASO%: 0.7 % (ref 0.0–2.0)
EOS ABS: 0.4 10*3/uL (ref 0.0–0.5)
EOS%: 5.3 % (ref 0.0–7.0)
HEMATOCRIT: 30.1 % — AB (ref 34.8–46.6)
HEMOGLOBIN: 9.5 g/dL — AB (ref 11.6–15.9)
LYMPH#: 3 10*3/uL (ref 0.9–3.3)
LYMPH%: 40.1 % (ref 14.0–48.0)
MCH: 23.8 pg — AB (ref 26.0–34.0)
MCHC: 31.6 g/dL — ABNORMAL LOW (ref 32.0–36.0)
MCV: 75 fL — AB (ref 81–101)
MONO#: 0.7 10*3/uL (ref 0.1–0.9)
MONO%: 9.8 % (ref 0.0–13.0)
NEUT#: 3.3 10*3/uL (ref 1.5–6.5)
NEUT%: 44.1 % (ref 39.6–80.0)
PLATELETS: 303 10*3/uL (ref 145–400)
RBC: 4 10*6/uL (ref 3.70–5.32)
RDW: 15.9 % — ABNORMAL HIGH (ref 11.1–15.7)
WBC: 7.5 10*3/uL (ref 3.9–10.0)

## 2016-09-16 LAB — CHCC SATELLITE - SMEAR

## 2016-09-16 MED ORDER — SODIUM CHLORIDE 0.9 % IV SOLN
Freq: Once | INTRAVENOUS | Status: AC
  Administered 2016-09-16: 11:00:00 via INTRAVENOUS

## 2016-09-16 MED ORDER — SODIUM CHLORIDE 0.9 % IV SOLN
510.0000 mg | Freq: Once | INTRAVENOUS | Status: AC
  Administered 2016-09-16: 510 mg via INTRAVENOUS
  Filled 2016-09-16: qty 17

## 2016-09-16 NOTE — Patient Instructions (Signed)

## 2016-09-16 NOTE — Progress Notes (Signed)
Hematology/Oncology Consultation   Name: [name]      MRN: [id number]    Location: Room/bed info not found  Date: 09/16/2016 Time:11:02 AM   REFERRING PHYSICIAN: Jacinta ShoeAleksei Plotnikov, MD  REASON FOR CONSULT: iron deficiency anemia due to chronic blood loss   DIAGNOSIS: Iron deficiency anemia secondary to chronic blood low due to menorrhagia  HISTORY OF PRESENT ILLNESS: [name] is a very pleasant 30 yo African American female with history of iron deficiency anemia secondary to menorrhagia. She states that she has had iron deficiency anemia and has tried taking an oral iron supplement but was unable to tolerate this due to GI upset and worsening of constipation even with laxatives.  Her cycles are heavy and last 7 days at a time. They have stayed regular. She is symptomatic with chewing ice and occasional palpitations. Her energy level is also down.  Her Hgb today is 9.5 with an MCV of 75. Her iron saturation was 3.4% with a total iron of 16. No family history of anemia that she knows of. No personal or familial history of sickle cell disease or trait.   She will starting on birth control with the Nuva Ring later this month. Hopefully this will help lighten her cycle.  She has a 310 yo son who is healthy. No history of miscarriage.  No family history of cancer other than paternal grandfather with lung cancer. He was a smoker.  She has maintained a good appetite but admits that she does not eat healthy and snacks often. She also states that she needs tio drink more water and has been cutting back on soft drinks. Her weight is stable.  No issue with infections. No fever, chills, n/v, cough, rash, dizziness, SOB, chest pain, palpitations, abdominal pain or changes in bowel or bladder habits. She has occasional constipation and takes stool softeners as needed.  No weakness, swelling, tenderness, numbness or tingling in her extremities. No c/o aches or pains.  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 She is not a smoker.  She was also found to be vitamin D deficient and started Vit D 50,000 units once a week yesterday.   ROS: All other 10 point review of systems is negative.   PAST MEDICAL HISTORY:   Past Medical History:  Diagnosis Date  . Anemia    no current med.  . Fracture of radius, distal, right, closed 03/20/2015  . GERD (gastroesophageal reflux disease)    OTC as needed  . History of cardiac murmur    states workup 2013 - no problems identified  . Radius fracture 03/16/2015   right    ALLERGIES: No Known Allergies    MEDICATIONS:  Current Outpatient Prescriptions on File Prior to Visit  Medication Sig Dispense Refill  . cholecalciferol (VITAMIN D) 1000 UNITS tablet Take 1,000 Units by mouth daily.      . ergocalciferol (VITAMIN D2) 50000 units capsule Take 1 capsule (50,000 Units total) by mouth once a week. 6 capsule 0  . etonogestrel-ethinyl estradiol (NUVARING) 0.12-0.015 MG/24HR vaginal ring Insert vaginally and leave in place for 3 consecutive weeks, then remove for 1 week. 1 each 12  . fexofenadine-pseudoephedrine (ALLEGRA-D 24) 180-240 MG per 24 hr tablet Take 1 tablet by mouth daily.    . Prenat-FeFmCb-DSS-FA-DHA w/o A (CITRANATAL HARMONY) 27-1-260 MG CAPS Take 1 capsule by mouth daily. 30 capsule 11  . promethazine (PHENERGAN) 12.5 MG tablet Take 1 tablet (12.5 mg total) by mouth every 8 (eight) hours  as needed for nausea or vomiting. 20 tablet 0  . sennosides-docusate sodium (SENOKOT-S) 8.6-50 MG tablet Take 2 tablets by mouth daily. 30 tablet 1  . triamcinolone cream (KENALOG) 0.5 % APPLY TO AFFECTED AREA TOPICALLY 4 TIMES A DAY 30 g 0   No current facility-administered medications on file prior to visit.      PAST SURGICAL HISTORY Past Surgical History:  Procedure Laterality Date  . CESAREAN SECTION  03/15/2006  . OPEN REDUCTION INTERNAL FIXATION (ORIF) DISTAL RADIAL FRACTURE Right 03/20/2015   Procedure: OPEN REDUCTION INTERNAL FIXATION RIGHT DISTAL  RADIUS;  Surgeon: Teryl LucyJoshua Landau, MD;  Location: [facility];  Service: Orthopedics;  Laterality: Right;    FAMILY HISTORY: History reviewed. No pertinent family history.  SOCIAL HISTORY:  reports that she has never smoked. She has never used smokeless tobacco. She reports that she drinks alcohol. She reports that she does not use drugs.  PERFORMANCE STATUS: The patient's performance status is 1 - Symptomatic but completely ambulatory  PHYSICAL EXAM: Most Recent Vital Signs: Blood pressure 115/64, pulse 73, temperature 98.5 F (36.9 C), temperature source Oral, resp. rate 16, height 5' (1.524 m), weight 168 lb 6.4 oz (76.4 kg). BP 115/64 (BP Location: Left Arm, Patient Position: Sitting)   Pulse 73   Temp 98.5 F (36.9 C) (Oral)   Resp 16   Ht 5' (1.524 m)   Wt 168 lb 6.4 oz (76.4 kg)   BMI 32.89 kg/m   General Appearance:    Alert, cooperative, no distress, appears stated age  Head:    Normocephalic, without obvious abnormality, atraumatic  Eyes:    PERRL, conjunctiva/corneas clear, EOM's intact, fundi    benign, both eyes        Throat:   Lips, mucosa, and tongue normal; teeth and gums normal  Neck:   Supple, symmetrical, trachea midline, no adenopathy;    thyroid:  no enlargement/tenderness/nodules; no carotid   bruit or JVD  Back:     Symmetric, no curvature, ROM normal, no CVA tenderness  Lungs:     Clear to auscultation bilaterally, respirations unlabored  Chest Wall:    No tenderness or deformity   Heart:    Regular rate and rhythm, S1 and S2 normal, no murmur, rub   or gallop     Abdomen:     Soft, non-tender, bowel sounds active all four quadrants,    no masses, no organomegaly        Extremities:   Extremities normal, atraumatic, no cyanosis or edema  Pulses:   2+ and symmetric all extremities  Skin:   Skin color, texture, turgor normal, no rashes or lesions  Lymph nodes:   Cervical, supraclavicular, and axillary nodes normal  Neurologic:    CNII-XII intact, normal strength, sensation and reflexes    throughout    LABORATORY DATA: Results for orders placed or performed in visit on 09/16/16 (from the past 48 hour(s))  CBC w/Diff     Status: Abnormal   Collection Time: 09/16/16  9:04 AM  Result Value Ref Range   WBC 7.5 3.9 - 10.0 10e3/uL   RBC 4.00 3.70 - 5.32 10e6/uL   HGB 9.5 (L) 11.6 - 15.9 g/dL   HCT 16.130.1 (L) 09.634.8 - 04.546.6 %   MCV 75 (L) 81 - 101 fL   MCH 23.8 (L) 26.0 - 34.0 pg   MCHC 31.6 (L) 32.0 - 36.0 g/dL   RDW 40.915.9 (H) 81.111.1 - 91.415.7 %   Platelets 303 145 -  400 10e3/uL   NEUT# 3.3 1.5 - 6.5 10e3/uL   LYMPH# 3.0 0.9 - 3.3 10e3/uL   MONO# 0.7 0.1 - 0.9 10e3/uL   Eosinophils Absolute 0.4 0.0 - 0.5 10e3/uL   BASO# 0.1 0.0 - 0.2 10e3/uL   NEUT% 44.1 39.6 - 80.0 %   LYMPH% 40.1 14.0 - 48.0 %   MONO% 9.8 0.0 - 13.0 %   EOS% 5.3 0.0 - 7.0 %   BASO% 0.7 0.0 - 2.0 %  Smear     Status: None   Collection Time: 09/16/16  9:04 AM  Result Value Ref Range   Smear Result Smear Available       RADIOGRAPHY: No results found.     PATHOLOGY: None   ASSESSMENT/PLAN: [name] is a very pleasant 30 yo African American female with history of iron deficiency anemia secondary to menorrhagia. She failed oral iron supplements and is here today for an iron infusion. Her iron saturation is 3.4% and Hgb is 9.5 with an MCV of 75.  She is symptomatic at this time with fatigue, palpitations and chewing ice.  We will give her Feraheme today and then a second dose in 8 days.  She will also be starting on birth control with the Nuva Ring later this month. We will see if this makes a difference in how heavy her cycle is.  We will plan to see her back in 6 weeks for repeat lab work and follow-up.  All questions were answered. She will contact our office with any problems, questions or concerns. We can certainly see her much sooner if necessary.  She was discussed with and also seen by Dr. Myna Hidalgo and he is in agreement with the  aforementioned.   [facility] M     Addendum:  I saw and examined the patient with Chevella Pearce. I looked at her blistering on the microscope. She definitely had some microcytic red cells. They're negative and a few target cells. I do not see any sickle cells. She had no immature myeloid lymphoid forms.  She has heavy cycles. Her gynecologist will be trying to slow this down.  Her iron saturation was only 3% a week ago. As such, she is definitely iron deficient.  We will go ahead and give her iron today and then 1 dose in a week. This should be able to get her MCV up and improve her anemia. I will like to see her feel better over the holidays.  We spent about 45 minutes with her. We answered all of her questions. She is very nice.  Christin Bach, MD

## 2016-09-19 LAB — HEMOGLOBINOPATHY EVALUATION
HEMOGLOBIN F QUANTITATION: 0 % (ref 0.0–2.0)
HGB C: 0 %
HGB S: 0 %
Hemoglobin A2 Quantitation: 2.3 % (ref 0.7–3.1)
Hgb A: 97.7 % (ref 94.0–98.0)

## 2016-09-23 ENCOUNTER — Ambulatory Visit (HOSPITAL_BASED_OUTPATIENT_CLINIC_OR_DEPARTMENT_OTHER): Payer: BLUE CROSS/BLUE SHIELD

## 2016-09-23 VITALS — BP 115/55 | HR 77 | Temp 98.2°F | Resp 16

## 2016-09-23 DIAGNOSIS — N920 Excessive and frequent menstruation with regular cycle: Secondary | ICD-10-CM | POA: Diagnosis not present

## 2016-09-23 DIAGNOSIS — D500 Iron deficiency anemia secondary to blood loss (chronic): Secondary | ICD-10-CM | POA: Diagnosis not present

## 2016-09-23 MED ORDER — SODIUM CHLORIDE 0.9 % IV SOLN
Freq: Once | INTRAVENOUS | Status: AC
  Administered 2016-09-23: 10:00:00 via INTRAVENOUS

## 2016-09-23 MED ORDER — SODIUM CHLORIDE 0.9 % IV SOLN
510.0000 mg | Freq: Once | INTRAVENOUS | Status: AC
  Administered 2016-09-23: 510 mg via INTRAVENOUS
  Filled 2016-09-23: qty 17

## 2016-09-23 NOTE — Patient Instructions (Signed)

## 2016-09-26 ENCOUNTER — Ambulatory Visit (INDEPENDENT_AMBULATORY_CARE_PROVIDER_SITE_OTHER): Payer: BLUE CROSS/BLUE SHIELD | Admitting: Obstetrics

## 2016-09-26 ENCOUNTER — Encounter: Payer: Self-pay | Admitting: Obstetrics

## 2016-09-26 VITALS — BP 115/77 | HR 78 | Temp 98.0°F | Ht 60.0 in | Wt 166.8 lb

## 2016-09-26 DIAGNOSIS — Z124 Encounter for screening for malignant neoplasm of cervix: Secondary | ICD-10-CM | POA: Diagnosis not present

## 2016-09-26 DIAGNOSIS — D509 Iron deficiency anemia, unspecified: Secondary | ICD-10-CM

## 2016-09-26 DIAGNOSIS — Z30015 Encounter for initial prescription of vaginal ring hormonal contraceptive: Secondary | ICD-10-CM

## 2016-09-26 DIAGNOSIS — Z3040 Encounter for surveillance of contraceptives, unspecified: Secondary | ICD-10-CM | POA: Diagnosis not present

## 2016-09-26 DIAGNOSIS — Z1151 Encounter for screening for human papillomavirus (HPV): Secondary | ICD-10-CM | POA: Diagnosis not present

## 2016-09-26 DIAGNOSIS — Z01419 Encounter for gynecological examination (general) (routine) without abnormal findings: Secondary | ICD-10-CM | POA: Diagnosis not present

## 2016-09-26 DIAGNOSIS — D560 Alpha thalassemia: Secondary | ICD-10-CM

## 2016-09-26 MED ORDER — CITRANATAL BLOOM 90-1 MG PO TABS: 1.0000 | ORAL_TABLET | Freq: Every day | ORAL | 11 refills | Status: DC

## 2016-09-26 MED ORDER — ETONOGESTREL-ETHINYL ESTRADIOL 0.12-0.015 MG/24HR VA RING: VAGINAL_RING | VAGINAL | 12 refills | Status: DC

## 2016-09-26 MED ORDER — CHROMAGEN PO CAPS: 1.0000 | ORAL_CAPSULE | Freq: Every day | ORAL | 11 refills | Status: DC

## 2016-09-26 MED ORDER — CITRANATAL HARMONY 27-1-260 MG PO CAPS: 1.0000 | ORAL_CAPSULE | Freq: Every day | ORAL | 11 refills | Status: DC

## 2016-09-26 NOTE — Addendum Note (Signed)
Addended by: Francene FindersJAMES, Mckinlee Dunk C on: 09/26/2016 11:57 AM   Modules accepted: Orders

## 2016-09-26 NOTE — Progress Notes (Signed)
Subjective:        [name] is a 30 y.o. female here for a routine exam.  Current complaints: Malodorous vaginal discharge.  Denies itching or irritation.    Personal health questionnaire:  Is patient Ashkenazi Jewish, have a family history of breast and/or ovarian cancer: no Is there a family history of uterine cancer diagnosed at age < 6250, gastrointestinal cancer, urinary tract cancer, family member who is a Personnel officerLynch syndrome-associated carrier: no Is the patient overweight and hypertensive, family history of diabetes, personal history of gestational diabetes, preeclampsia or PCOS: no Is patient over 8755, have PCOS,  family history of premature CHD under age 29, diabetes, smoke, have hypertension or peripheral artery disease:  no At any time, has a partner hit, kicked or otherwise hurt or frightened you?: no Over the past 2 weeks, have you felt down, depressed or hopeless?: no Over the past 2 weeks, have you felt little interest or pleasure in doing things?:no   Gynecologic History Patient's last menstrual period was 08/17/2016 (approximate). Contraception: NuvaRing vaginal inserts Last Pap: unknown. Results were: normal Last mammogram: n/a. Results were: n/a  Obstetric History OB History  No data available    Past Medical History:  Diagnosis Date  . Anemia    no current med.  . Fracture of radius, distal, right, closed 03/20/2015  . GERD (gastroesophageal reflux disease)    OTC as needed  . History of cardiac murmur    states workup 2013 - no problems identified  . Radius fracture 03/16/2015   right    Past Surgical History:  Procedure Laterality Date  . CESAREAN SECTION  03/15/2006  . OPEN REDUCTION INTERNAL FIXATION (ORIF) DISTAL RADIAL FRACTURE Right 03/20/2015   Procedure: OPEN REDUCTION INTERNAL FIXATION RIGHT DISTAL RADIUS;  Surgeon: Teryl LucyJoshua Landau, MD;  Location: [facility];  Service: Orthopedics;  Laterality: Right;     Current Outpatient  Prescriptions:  .  cholecalciferol (VITAMIN D) 1000 UNITS tablet, Take 1,000 Units by mouth daily.  , Disp: , Rfl:  .  ergocalciferol (VITAMIN D2) 50000 units capsule, Take 1 capsule (50,000 Units total) by mouth once a week., Disp: 6 capsule, Rfl: 0 .  etonogestrel-ethinyl estradiol (NUVARING) 0.12-0.015 MG/24HR vaginal ring, Insert vaginally and leave in place for 3 consecutive weeks, then remove for 1 week., Disp: 1 each, Rfl: 12 .  fexofenadine-pseudoephedrine (ALLEGRA-D 24) 180-240 MG per 24 hr tablet, Take 1 tablet by mouth daily., Disp: , Rfl:  .  promethazine (PHENERGAN) 12.5 MG tablet, Take 1 tablet (12.5 mg total) by mouth every 8 (eight) hours as needed for nausea or vomiting., Disp: 20 tablet, Rfl: 0 .  triamcinolone cream (KENALOG) 0.5 %, APPLY TO AFFECTED AREA TOPICALLY 4 TIMES A DAY, Disp: 30 g, Rfl: 0 .  Prenatal-DSS-FeCb-FeGl-FA (CITRANATAL BLOOM) 90-1 MG TABS, Take 1 tablet by mouth daily before breakfast., Disp: 30 tablet, Rfl: 11 .  sennosides-docusate sodium (SENOKOT-S) 8.6-50 MG tablet, Take 2 tablets by mouth daily. (Patient not taking: Reported on 09/26/2016), Disp: 30 tablet, Rfl: 1 No Known Allergies  Social History  Substance Use Topics  . Smoking status: Never Smoker  . Smokeless tobacco: Never Used  . Alcohol use Yes     Comment: occasionally    History reviewed. No pertinent family history.    Review of Systems  Constitutional: negative for fatigue and weight loss Respiratory: negative for cough and wheezing Cardiovascular: negative for chest pain, fatigue and palpitations Gastrointestinal: negative for abdominal pain and change in  bowel habits Musculoskeletal:negative for myalgias Neurological: negative for gait problems and tremors Behavioral/Psych: negative for abusive relationship, depression Endocrine: negative for temperature intolerance    Genitourinary:  Positive for heavy periods with clots,  and malodorous vaginal discharge Integument/breast:  negative for breast lump, breast tenderness, nipple discharge and skin lesion(s)    Objective:       BP 115/77   Pulse 78   Temp 98 F (36.7 C) (Oral)   Ht 5' (1.524 m)   Wt 166 lb 12.8 oz (75.7 kg)   LMP 08/17/2016 (Approximate)   BMI 32.58 kg/m  General:   alert  Skin:   no rash or abnormalities  Lungs:   clear to auscultation bilaterally  Heart:   regular rate and rhythm, S1, S2 normal, no murmur, click, rub or gallop  Breasts:   normal without suspicious masses, skin or nipple changes or axillary nodes  Abdomen:  normal findings: no organomegaly, soft, non-tender and no hernia  Pelvis:  External genitalia: normal general appearance Urinary system: urethral meatus normal and bladder without fullness, nontender Vaginal: normal without tenderness, induration or masses Cervix: normal appearance Adnexa: normal bimanual exam Uterus: anteverted and non-tender, normal size   Lab Review Urine pregnancy test Labs reviewed yes Radiologic studies reviewed yes  50% of 20 min visit spent on counseling and coordination of care.    Assessment:    Healthy female exam.    AUB - Hormonal Imbalance  Malodorous vaginal discharge Contraceptive Surveillance.  Pleased with NuvaRing. Alpha Thalassemia.  Chronic Iron Deficiency Anemia from heavy periods.   Plan:    Start continuous use of NuvaRing.  May withdraw 2-3 times a year for 3 day period.  Education reviewed: calcium supplements, low fat, low cholesterol diet, safe sex/STD prevention, self breast exams and weight bearing exercise. Contraception: NuvaRing vaginal inserts and contiuous use for next 6 months. Follow up in: 6 months.   Meds ordered this encounter  Medications  . etonogestrel-ethinyl estradiol (NUVARING) 0.12-0.015 MG/24HR vaginal ring    Sig: Insert vaginally and leave in place for 3 consecutive weeks, then remove for 1 week.    Dispense:  1 each    Refill:  12  . DISCONTD: Prenat-FeFmCb-DSS-FA-DHA w/o A  (CITRANATAL HARMONY) 27-1-260 MG CAPS    Sig: Take 1 capsule by mouth daily.    Dispense:  30 capsule    Refill:  11  . DISCONTD: Iron Combinations (CHROMAGEN) capsule    Sig: Take 1 capsule by mouth daily.    Dispense:  30 capsule    Refill:  11  . Prenatal-DSS-FeCb-FeGl-FA (CITRANATAL BLOOM) 90-1 MG TABS    Sig: Take 1 tablet by mouth daily before breakfast.    Dispense:  30 tablet    Refill:  11   No orders of the defined types were placed in this encounter.     Patient ID: Courtney PhoDawn D Ludke, female   DOB: 1985/12/24, 30 y.o.   MRN: [id number]

## 2016-09-29 LAB — NUSWAB VG+, CANDIDA 6SP
CANDIDA GLABRATA, NAA: NEGATIVE
CANDIDA KRUSEI, NAA: NEGATIVE
CHLAMYDIA TRACHOMATIS, NAA: NEGATIVE
Candida albicans, NAA: POSITIVE — AB
Candida lusitaniae, NAA: NEGATIVE
Candida parapsilosis, NAA: POSITIVE — AB
Candida tropicalis, NAA: NEGATIVE
NEISSERIA GONORRHOEAE, NAA: NEGATIVE
TRICH VAG BY NAA: NEGATIVE

## 2016-09-30 LAB — CYTOLOGY - PAP: Diagnosis: NEGATIVE

## 2016-10-03 ENCOUNTER — Other Ambulatory Visit: Payer: Self-pay | Admitting: Obstetrics

## 2016-10-03 DIAGNOSIS — B3731 Acute candidiasis of vulva and vagina: Secondary | ICD-10-CM

## 2016-10-03 DIAGNOSIS — B373 Candidiasis of vulva and vagina: Secondary | ICD-10-CM

## 2016-10-03 MED ORDER — FLUCONAZOLE 150 MG PO TABS: 150.0000 mg | ORAL_TABLET | Freq: Once | ORAL | 2 refills | Status: AC

## 2016-10-13 ENCOUNTER — Telehealth: Payer: Self-pay

## 2016-10-24 ENCOUNTER — Encounter: Payer: Self-pay | Admitting: Obstetrics

## 2016-10-25 ENCOUNTER — Encounter: Payer: Self-pay | Admitting: Internal Medicine

## 2016-10-26 ENCOUNTER — Telehealth: Payer: Self-pay | Admitting: Emergency Medicine

## 2016-10-26 NOTE — Telephone Encounter (Signed)
Pt called and asked that you give her a call back. When I asked the reason why she would only say that she sent an email about it and would discuss it with you when you call her back. Please advise thanks.

## 2016-10-27 NOTE — Telephone Encounter (Signed)
pls make an appt to see any of our providers ASAP Thx

## 2016-10-27 NOTE — Telephone Encounter (Signed)
I called pt-  She is requesting a f/u on MyChart message she sent you below:   [name]  to Tresa GarterAleksei V Plotnikov, MD     10/25/16 10:03 AM  Dr. Posey ReaPlotnikov, both of my eyes have been very itchy and slightly pink for about a week now. The first couple days I had drainage but it has subsided. Now they are still itchy and slightly pink. Do I need to make an appointment to come in or is there somthing you can prescribe to help with this irritation? Please advise.    She is aware that PCP is out of office until 10/28/16.

## 2016-10-28 ENCOUNTER — Ambulatory Visit: Payer: BLUE CROSS/BLUE SHIELD | Admitting: Internal Medicine

## 2016-10-28 ENCOUNTER — Ambulatory Visit (HOSPITAL_BASED_OUTPATIENT_CLINIC_OR_DEPARTMENT_OTHER): Payer: BLUE CROSS/BLUE SHIELD | Admitting: Family

## 2016-10-28 ENCOUNTER — Other Ambulatory Visit (HOSPITAL_BASED_OUTPATIENT_CLINIC_OR_DEPARTMENT_OTHER): Payer: BLUE CROSS/BLUE SHIELD

## 2016-10-28 ENCOUNTER — Encounter: Payer: Self-pay | Admitting: Family

## 2016-10-28 VITALS — BP 109/76 | HR 73 | Temp 98.2°F | Resp 16 | Wt 170.0 lb

## 2016-10-28 DIAGNOSIS — D560 Alpha thalassemia: Secondary | ICD-10-CM

## 2016-10-28 DIAGNOSIS — D500 Iron deficiency anemia secondary to blood loss (chronic): Secondary | ICD-10-CM

## 2016-10-28 DIAGNOSIS — N920 Excessive and frequent menstruation with regular cycle: Secondary | ICD-10-CM

## 2016-10-28 LAB — CBC WITH DIFFERENTIAL (CANCER CENTER ONLY)
BASO#: 0 10*3/uL (ref 0.0–0.2)
BASO%: 0.5 % (ref 0.0–2.0)
EOS ABS: 0.6 10*3/uL — AB (ref 0.0–0.5)
EOS%: 8.5 % — ABNORMAL HIGH (ref 0.0–7.0)
HCT: 37.6 % (ref 34.8–46.6)
HGB: 12.4 g/dL (ref 11.6–15.9)
LYMPH#: 2.9 10*3/uL (ref 0.9–3.3)
LYMPH%: 38.1 % (ref 14.0–48.0)
MCH: 27.4 pg (ref 26.0–34.0)
MCHC: 33 g/dL (ref 32.0–36.0)
MCV: 83 fL (ref 81–101)
MONO#: 0.5 10*3/uL (ref 0.1–0.9)
MONO%: 6.7 % (ref 0.0–13.0)
NEUT#: 3.5 10*3/uL (ref 1.5–6.5)
NEUT%: 46.2 % (ref 39.6–80.0)
PLATELETS: 294 10*3/uL (ref 145–400)
RBC: 4.52 10*6/uL (ref 3.70–5.32)
RDW: 20.2 % — AB (ref 11.1–15.7)
WBC: 7.6 10*3/uL (ref 3.9–10.0)

## 2016-10-28 LAB — IRON AND TIBC
%SAT: 17 % — AB (ref 21–57)
Iron: 46 ug/dL (ref 41–142)
TIBC: 271 ug/dL (ref 236–444)
UIBC: 225 ug/dL (ref 120–384)

## 2016-10-28 LAB — FERRITIN: FERRITIN: 173 ng/mL (ref 9–269)

## 2016-10-28 NOTE — Progress Notes (Signed)
Hematology and Oncology Follow Up Visit  [place] PhoDawn D [name] [id number] 09-14-1986 30 y.o. 10/28/2016   Principle Diagnosis:  Iron deficiency anemia secondary to chronic blood loss due to menorrhagia  Current Therapy:   IV iron as indicated - last received Feraheme x 2 in November 2017    Interim History:  [name] is here today for a follow-up. She received Feraheme twice in November and appears to have had a nice response. Her symptoms are improving. She is not craving ice as often and her fatigue is much better. Hgb is now up to 12.4 with an MCV of 83.  Her cycles are much lighter now that she has the [employer].  No fever, chills, n/v, cough, rash, dizziness, SOB, chest pain, abdominal pain or changes in bowel or bladder habits.  She has occasional palpitations and is avoiding energy drinks.  No swelling, tenderness, numbness or tingling in her extremities. No new aches or pains.  She has maintained a good appetite but admits that she needs to drink more fluids. Her weight is stable.   Medications:  Allergies as of 10/28/2016   No Known Allergies     Medication List       Accurate as of 10/28/16 10:09 AM. Always use your most recent med list.          cholecalciferol 1000 units tablet Commonly known as:  VITAMIN D Take 1,000 Units by mouth daily.   CITRANATAL BLOOM 90-1 MG Tabs Take 1 tablet by mouth daily before breakfast.   ergocalciferol 50000 units capsule Commonly known as:  VITAMIN D2 Take 1 capsule (50,000 Units total) by mouth once a week.   etonogestrel-ethinyl estradiol 0.12-0.015 MG/24HR vaginal ring Commonly known as:  NUVARING Insert vaginally and leave in place for 3 consecutive weeks, then remove for 1 week.   fexofenadine-pseudoephedrine 180-240 MG 24 hr tablet Commonly known as:  ALLEGRA-D 24 Take 1 tablet by mouth daily.   promethazine 12.5 MG tablet Commonly known as:  PHENERGAN Take 1 tablet (12.5 mg total) by mouth every 8 (eight) hours as  needed for nausea or vomiting.   sennosides-docusate sodium 8.6-50 MG tablet Commonly known as:  SENOKOT-S Take 2 tablets by mouth daily.   triamcinolone cream 0.5 % Commonly known as:  KENALOG APPLY TO AFFECTED AREA TOPICALLY 4 TIMES A DAY       Allergies: No Known Allergies  Past Medical History, Surgical history, Social history, and Family History were reviewed and updated.  Review of Systems: All other 10 point review of systems is negative.   Physical Exam:  weight is 170 lb (77.1 kg). Her oral temperature is 98.2 F (36.8 C). Her blood pressure is 109/76 and her pulse is 73. Her respiration is 16.   Wt Readings from Last 3 Encounters:  10/28/16 170 lb (77.1 kg)  09/26/16 166 lb 12.8 oz (75.7 kg)  09/16/16 168 lb 6.4 oz (76.4 kg)    Ocular: Sclerae unicteric, pupils equal, round and reactive to light Ear-nose-throat: Oropharynx clear, dentition fair Lymphatic: No cervical supraclavicular or axillary adenopathy Lungs no rales or rhonchi, good excursion bilaterally Heart regular rate and rhythm, no murmur appreciated Abd soft, nontender, positive bowel sounds, no liver or spleen tip palpated on exam, no fluid wave MSK no focal spinal tenderness, no joint edema Neuro: non-focal, well-oriented, appropriate affect Breasts: Deferred  Lab Results  Component Value Date   WBC 7.6 10/28/2016   HGB 12.4 10/28/2016   HCT 37.6 10/28/2016   MCV 83  10/28/2016   PLT 294 10/28/2016   Lab Results  Component Value Date   FERRITIN 2.2 (L) 11/16/2009   IRON 16 (L) 09/09/2016   IRONPCTSAT 3.4 (L) 09/09/2016   Lab Results  Component Value Date   RBC 4.52 10/28/2016   No results found for: KPAFRELGTCHN, LAMBDASER, [facility] Lab Results  Component Value Date   IGA 310 11/16/2009   No results found for: Marda StalkerOTALPROTELP, ALBUMINELP, A1GS, A2GS, BETS, BETA2SER, GAMS, MSPIKE, SPEI   Chemistry      Component Value Date/Time   NA 136 08/03/2016 1643   K 3.4 (L) 08/03/2016  1643   CL 104 08/03/2016 1643   CO2 26 08/03/2016 1643   BUN 9 08/03/2016 1643   CREATININE 0.63 08/03/2016 1643      Component Value Date/Time   CALCIUM 8.9 08/03/2016 1643   ALKPHOS 50 08/03/2016 1643   AST 13 08/03/2016 1643   ALT 8 08/03/2016 1643   BILITOT 0.3 08/03/2016 1643     Impression and Plan: [name] is a very pleasant 30 yo African American female with history of iron deficiency anemia secondary to menorrhagia. She received 2 doses of IV iron in November and has responded nicely. Hgb is now 12.4 and MCV if improved.  Her cycles are lighter on birth control.  We will see what her iron studies show and bring her back in next week for an infusion if needed.  She will contact our office with any questions or concerns. We can certainly see her sooner if need be.   Verdie MosherINCINNATI,Tullio Chausse M, NP 12/22/201710:09 AM

## 2016-10-28 NOTE — Telephone Encounter (Signed)
Will have Dustin, [facility] scheduler/team lead call pt to schedule OV.

## 2016-11-01 ENCOUNTER — Telehealth: Payer: Self-pay | Admitting: *Deleted

## 2016-11-01 NOTE — Telephone Encounter (Signed)
-----   Message from Verdie MosherSarah M [place], NP sent at 10/28/2016  2:07 PM EST ----- Regarding: iron  Iron saturation still low but has improved/ She will need one dose of Feraheme next week please. Thank you!  Sarah  ----- Message ----- From: Interface, Lab In Three Zero One Sent: 10/28/2016  10:07 AM To: Verdie MosherSarah M [place], NP

## 2016-11-03 ENCOUNTER — Telehealth: Payer: Self-pay

## 2016-11-03 NOTE — Telephone Encounter (Addendum)
-----   Message from Verdie MosherSarah M [place], NP sent at 10/28/2016  2:07 PM EST ----- Regarding: iron  Iron saturation still low but has improved/ She will need one dose of Feraheme next week please. Thank you!  Sarah  Above message given to pt via phone, then transferred to scheduling for appt. dph

## 2016-11-10 ENCOUNTER — Ambulatory Visit (HOSPITAL_BASED_OUTPATIENT_CLINIC_OR_DEPARTMENT_OTHER): Payer: BLUE CROSS/BLUE SHIELD

## 2016-11-10 VITALS — BP 101/63 | HR 76 | Temp 98.2°F | Resp 17

## 2016-11-10 DIAGNOSIS — D500 Iron deficiency anemia secondary to blood loss (chronic): Secondary | ICD-10-CM | POA: Diagnosis not present

## 2016-11-10 DIAGNOSIS — N920 Excessive and frequent menstruation with regular cycle: Secondary | ICD-10-CM | POA: Diagnosis not present

## 2016-11-10 MED ORDER — FERUMOXYTOL INJECTION 510 MG/17 ML
510.0000 mg | Freq: Once | INTRAVENOUS | Status: AC
  Administered 2016-11-10: 510 mg via INTRAVENOUS
  Filled 2016-11-10: qty 17

## 2016-11-10 NOTE — Patient Instructions (Signed)

## 2016-11-11 LAB — ALPHA-THALASSEMIA GENOTYPR

## 2016-11-23 ENCOUNTER — Ambulatory Visit: Payer: BLUE CROSS/BLUE SHIELD | Admitting: Internal Medicine

## 2016-11-30 ENCOUNTER — Telehealth: Payer: Self-pay | Admitting: *Deleted

## 2016-11-30 NOTE — Telephone Encounter (Signed)
Patient c/o eye redness. She received feraheme 20 days ago and she wanted to know if this could be related. She wants to schedule an appointment with her PCP to be assessed.  Spoke with patient. Informed her that eye redness, approx 20 days out from feraheme is unlikely to be related to the infusion. Encouraged her to follow up with her PCP. She will call to make appointment.

## 2016-12-01 ENCOUNTER — Ambulatory Visit (INDEPENDENT_AMBULATORY_CARE_PROVIDER_SITE_OTHER): Payer: BLUE CROSS/BLUE SHIELD | Admitting: Nurse Practitioner

## 2016-12-01 ENCOUNTER — Encounter: Payer: Self-pay | Admitting: Nurse Practitioner

## 2016-12-01 ENCOUNTER — Ambulatory Visit: Payer: BLUE CROSS/BLUE SHIELD | Admitting: Internal Medicine

## 2016-12-01 VITALS — BP 110/72 | HR 81 | Temp 98.7°F | Resp 16 | Wt 162.0 lb

## 2016-12-01 DIAGNOSIS — J0140 Acute pansinusitis, unspecified: Secondary | ICD-10-CM | POA: Diagnosis not present

## 2016-12-01 DIAGNOSIS — B309 Viral conjunctivitis, unspecified: Secondary | ICD-10-CM

## 2016-12-01 MED ORDER — FLUTICASONE PROPIONATE 50 MCG/ACT NA SUSP: 2.0000 | Freq: Every day | NASAL | 0 refills | Status: DC

## 2016-12-01 MED ORDER — OXYMETAZOLINE HCL 0.05 % NA SOLN: 1.0000 | Freq: Two times a day (BID) | NASAL | 0 refills | Status: DC

## 2016-12-01 MED ORDER — AZITHROMYCIN 250 MG PO TABS: 250.0000 mg | ORAL_TABLET | Freq: Every day | ORAL | 0 refills | Status: DC

## 2016-12-01 MED ORDER — GUAIFENESIN ER 600 MG PO TB12: 600.0000 mg | ORAL_TABLET | Freq: Two times a day (BID) | ORAL | 0 refills | Status: DC | PRN

## 2016-12-01 MED ORDER — OLOPATADINE HCL 0.2 % OP SOLN: 1.0000 [drp] | Freq: Two times a day (BID) | OPHTHALMIC | 0 refills | Status: DC

## 2016-12-01 NOTE — Progress Notes (Signed)
Pre visit review using our clinic review tool, if applicable. No additional management support is needed unless otherwise documented below in the visit note. 

## 2016-12-01 NOTE — Patient Instructions (Signed)
URI Instructions: Flonase and Afrin use: apply 1spray of afrin in each nare, wait , then apply 2sprays of flonase in each nare. Use both nasal spray consecutively x 3days, then flonase only for at least 14days.  Encourage adequate oral hydration.  Use over-the-counter  "cold" medicines  such as "Tylenol cold" , "Advil cold",  "Mucinex" or" Mucinex DM"  for cough and congestion.  Avoid decongestants if you have high blood pressure. Use" Delsym" or" Robitussin" cough syrup varietis for cough.  You can use plain "Tylenol" or "Advi"l for fever, chills and achyness.   Start azithromycin if no improvement in 3days

## 2016-12-01 NOTE — Progress Notes (Signed)
Subjective:  Patient ID: [name], female    DOB: [birth year]  Age: 31 y.o. MRN: [id number]  CC: Cough (head congestion, right eye irrittion. Started in Dec, comes and goes. )   Cough  This is a new problem. The current episode started 1 to 4 weeks ago. The problem has been waxing and waning. The problem occurs constantly. The cough is non-productive. Associated symptoms include ear congestion, headaches, nasal congestion, postnasal drip, rhinorrhea and a sore throat. Pertinent negatives include no chest pain, chills, ear pain, eye redness, fever, heartburn, hemoptysis, myalgias, rash, shortness of breath, sweats, weight loss or wheezing. The symptoms are aggravated by cold air and lying down. She has tried OTC cough suppressant for the symptoms. The treatment provided mild relief.  Eye Problem   Both eyes are affected.This is a new problem. The current episode started in the past 7 days. The problem occurs intermittently. The problem has been waxing and waning. There was no injury mechanism. The patient is experiencing no pain. There is no known exposure to pink eye. She does not wear contacts. Associated symptoms include an eye discharge and a recent URI. Pertinent negatives include no blurred vision, double vision, eye redness, fever, foreign body sensation, itching, nausea, photophobia or vomiting. She has tried water for the symptoms. The treatment provided mild relief.    Outpatient Medications Prior to Visit  Medication Sig Dispense Refill  . cholecalciferol (VITAMIN D) 1000 UNITS tablet Take 1,000 Units by mouth daily.      . ergocalciferol (VITAMIN D2) 50000 units capsule Take 1 capsule (50,000 Units total) by mouth once a week. 6 capsule 0  . etonogestrel-ethinyl estradiol (NUVARING) 0.12-0.015 MG/24HR vaginal ring Insert vaginally and leave in place for 3 consecutive weeks, then remove for 1 week. 1 each 12  . Prenatal-DSS-FeCb-FeGl-FA (CITRANATAL BLOOM) 90-1 MG TABS Take 1 tablet  by mouth daily before breakfast. 30 tablet 11  . promethazine (PHENERGAN) 12.5 MG tablet Take 1 tablet (12.5 mg total) by mouth every 8 (eight) hours as needed for nausea or vomiting. 20 tablet 0  . sennosides-docusate sodium (SENOKOT-S) 8.6-50 MG tablet Take 2 tablets by mouth daily. 30 tablet 1  . triamcinolone cream (KENALOG) 0.5 % APPLY TO AFFECTED AREA TOPICALLY 4 TIMES A DAY 30 g 0  . fexofenadine-pseudoephedrine (ALLEGRA-D 24) 180-240 MG per 24 hr tablet Take 1 tablet by mouth daily.     No facility-administered medications prior to visit.     ROS See HPI  Objective:  BP 110/72   Pulse 81   Temp 98.7 F (37.1 C) (Oral)   Resp 16   Wt 162 lb (73.5 kg)   SpO2 96%   BMI 31.64 kg/m   BP Readings from Last 3 Encounters:  12/01/16 110/72  11/10/16 101/63  10/28/16 109/76    Wt Readings from Last 3 Encounters:  12/01/16 162 lb (73.5 kg)  10/28/16 170 lb (77.1 kg)  09/26/16 166 lb 12.8 oz (75.7 kg)    Physical Exam  Constitutional: She is oriented to person, place, and time.  HENT:  Right Ear: Tympanic membrane, external ear and ear canal normal.  Left Ear: Tympanic membrane, external ear and ear canal normal.  Nose: Mucosal edema and rhinorrhea present. Right sinus exhibits maxillary sinus tenderness and frontal sinus tenderness. Left sinus exhibits maxillary sinus tenderness and frontal sinus tenderness.  Mouth/Throat: Uvula is midline. No trismus in the jaw. Posterior oropharyngeal erythema present. No oropharyngeal exudate.  Eyes: Conjunctivae and EOM are normal.  Pupils are equal, round, and reactive to light. Right eye exhibits no discharge. Left eye exhibits no discharge. No scleral icterus.  Neck: Normal range of motion. Neck supple.  Cardiovascular: Normal rate and normal heart sounds.   Pulmonary/Chest: Effort normal and breath sounds normal.  Musculoskeletal: She exhibits no edema.  Lymphadenopathy:    She has no cervical adenopathy.  Neurological: She is  alert and oriented to person, place, and time.  Vitals reviewed.   Lab Results  Component Value Date   WBC 7.6 10/28/2016   HGB 12.4 10/28/2016   HCT 37.6 10/28/2016   PLT 294 10/28/2016   GLUCOSE 86 08/03/2016   ALT 8 08/03/2016   AST 13 08/03/2016   NA 136 08/03/2016   K 3.4 (L) 08/03/2016   CL 104 08/03/2016   CREATININE 0.63 08/03/2016   BUN 9 08/03/2016   CO2 26 08/03/2016   TSH 1.18 08/03/2016    No results found.  Assessment & Plan:   [name] was seen today for cough.  Diagnoses and all orders for this visit:  Acute non-recurrent pansinusitis -     guaiFENesin (MUCINEX) 600 MG 12 hr tablet; Take 1 tablet (600 mg total) by mouth 2 (two) times daily as needed for cough or to loosen phlegm. -     fluticasone (FLONASE) 50 MCG/ACT nasal spray; Place 2 sprays into both nostrils daily. -     oxymetazoline (AFRIN NASAL SPRAY) 0.05 % nasal spray; Place 1 spray into both nostrils 2 (two) times daily. Use only for 3days, then stop -     Olopatadine HCl 0.2 % SOLN; Apply 1 drop to eye 2 (two) times daily. -     azithromycin (ZITHROMAX Z-PAK) 250 MG tablet; Take 1 tablet (250 mg total) by mouth daily. Take 2tabs on first day, then 1tab once a day till complete  Viral conjunctivitis of both eyes -     Olopatadine HCl 0.2 % SOLN; Apply 1 drop to eye 2 (two) times daily.   I have discontinued [name]'s fexofenadine-pseudoephedrine. I am also having her start on guaiFENesin, fluticasone, oxymetazoline, Olopatadine HCl, and azithromycin. Additionally, I am having her maintain her cholecalciferol, sennosides-docusate sodium, promethazine, triamcinolone cream, ergocalciferol, etonogestrel-ethinyl estradiol, and CITRANATAL BLOOM.  Meds ordered this encounter  Medications  . guaiFENesin (MUCINEX) 600 MG 12 hr tablet    Sig: Take 1 tablet (600 mg total) by mouth 2 (two) times daily as needed for cough or to loosen phlegm.    Dispense:  14 tablet    Refill:  0    Order Specific  Question:   Supervising Provider    Answer:   Tresa GarterPLOTNIKOV, ALEKSEI V [[id number]]  . fluticasone (FLONASE) 50 MCG/ACT nasal spray    Sig: Place 2 sprays into both nostrils daily.    Dispense:  16 g    Refill:  0    Order Specific Question:   Supervising Provider    Answer:   Tresa GarterPLOTNIKOV, ALEKSEI V [[id number]]  . oxymetazoline (AFRIN NASAL SPRAY) 0.05 % nasal spray    Sig: Place 1 spray into both nostrils 2 (two) times daily. Use only for 3days, then stop    Dispense:  30 mL    Refill:  0    Order Specific Question:   Supervising Provider    Answer:   Tresa GarterPLOTNIKOV, ALEKSEI V [[id number]]  . Olopatadine HCl 0.2 % SOLN    Sig: Apply 1 drop to eye 2 (two) times daily.    Dispense:  2.5 mL  Refill:  0    Order Specific Question:   Supervising Provider    Answer:   Cassandria Anger [[id number]]  . azithromycin (ZITHROMAX Z-PAK) 250 MG tablet    Sig: Take 1 tablet (250 mg total) by mouth daily. Take 2tabs on first day, then 1tab once a day till complete    Dispense:  6 tablet    Refill:  0    Order Specific Question:   Supervising Provider    Answer:   Cassandria Anger [[id number]]    Follow-up: Return if symptoms worsen or fail to improve.  Courtney Lacy, NP

## 2016-12-30 ENCOUNTER — Ambulatory Visit: Payer: BLUE CROSS/BLUE SHIELD | Admitting: Hematology & Oncology

## 2016-12-30 ENCOUNTER — Other Ambulatory Visit: Payer: BLUE CROSS/BLUE SHIELD

## 2017-02-16 ENCOUNTER — Other Ambulatory Visit: Payer: Self-pay | Admitting: Internal Medicine

## 2017-02-17 ENCOUNTER — Encounter: Payer: Self-pay | Admitting: Internal Medicine

## 2017-03-06 ENCOUNTER — Encounter: Payer: Self-pay | Admitting: Internal Medicine

## 2017-03-06 ENCOUNTER — Ambulatory Visit (INDEPENDENT_AMBULATORY_CARE_PROVIDER_SITE_OTHER): Payer: BLUE CROSS/BLUE SHIELD | Admitting: Internal Medicine

## 2017-03-06 ENCOUNTER — Other Ambulatory Visit: Payer: Self-pay | Admitting: Internal Medicine

## 2017-03-06 DIAGNOSIS — J0140 Acute pansinusitis, unspecified: Secondary | ICD-10-CM | POA: Diagnosis not present

## 2017-03-06 DIAGNOSIS — J0100 Acute maxillary sinusitis, unspecified: Secondary | ICD-10-CM | POA: Diagnosis not present

## 2017-03-06 DIAGNOSIS — J019 Acute sinusitis, unspecified: Secondary | ICD-10-CM | POA: Insufficient documentation

## 2017-03-06 DIAGNOSIS — J301 Allergic rhinitis due to pollen: Secondary | ICD-10-CM | POA: Diagnosis not present

## 2017-03-06 MED ORDER — METHYLPREDNISOLONE ACETATE 80 MG/ML IJ SUSP
80.0000 mg | Freq: Once | INTRAMUSCULAR | Status: AC
  Administered 2017-03-06: 80 mg via INTRAMUSCULAR

## 2017-03-06 MED ORDER — PROMETHAZINE-CODEINE 6.25-10 MG/5ML PO SYRP: 5.0000 mL | ORAL_SOLUTION | Freq: Four times a day (QID) | ORAL | 0 refills | Status: DC | PRN

## 2017-03-06 MED ORDER — AZITHROMYCIN 250 MG PO TABS: 250.0000 mg | ORAL_TABLET | Freq: Every day | ORAL | 0 refills | Status: DC

## 2017-03-06 NOTE — Telephone Encounter (Signed)
Pt was seen today by Plot. They discussed some cough medicine.She said no at the time, but would now like it .   [facility]

## 2017-03-06 NOTE — Progress Notes (Signed)
Pre visit review using our clinic review tool, if applicable. No additional management support is needed unless otherwise documented below in the visit note. 

## 2017-03-06 NOTE — Addendum Note (Signed)
Addended by: Scarlett Presto on: 03/06/2017 11:43 AM   Modules accepted: Orders

## 2017-03-06 NOTE — Assessment & Plan Note (Addendum)
Worse Depomedrol 80 mg IM Allegra D, Flonase

## 2017-03-06 NOTE — Patient Instructions (Signed)
You can use over-the-counter "Afrin" nasal spray for nasal congestion as directed. Use " Delsym" or" Robitussin" cough syrup varietis for cough.  You can use plain "Tylenol" or "Advil" for fever, chills and achyness. Use Halls or Ricola cough drops.    Please, make an appointment if you are not better or if you're worse.

## 2017-03-06 NOTE — Progress Notes (Signed)
Subjective:  Patient ID: [name], female    DOB: 04/20/1986  Age: 31 y.o. MRN: [id number]  CC: No chief complaint on file.   HPI [name] presents for allergiees, cough C/o sinus drainage - yellow  Outpatient Medications Prior to Visit  Medication Sig Dispense Refill  . cholecalciferol (VITAMIN D) 1000 UNITS tablet Take 1,000 Units by mouth daily.      Marland Kitchen etonogestrel-ethinyl estradiol (NUVARING) 0.12-0.015 MG/24HR vaginal ring Insert vaginally and leave in place for 3 consecutive weeks, then remove for 1 week. 1 each 12  . fluticasone (FLONASE) 50 MCG/ACT nasal spray Place 2 sprays into both nostrils daily. 16 g 0  . guaiFENesin (MUCINEX) 600 MG 12 hr tablet Take 1 tablet (600 mg total) by mouth 2 (two) times daily as needed for cough or to loosen phlegm. 14 tablet 0  . Olopatadine HCl 0.2 % SOLN Apply 1 drop to eye 2 (two) times daily. 2.5 mL 0  . oxymetazoline (AFRIN NASAL SPRAY) 0.05 % nasal spray Place 1 spray into both nostrils 2 (two) times daily. Use only for 3days, then stop 30 mL 0  . Prenatal-DSS-FeCb-FeGl-FA (CITRANATAL BLOOM) 90-1 MG TABS Take 1 tablet by mouth daily before breakfast. 30 tablet 11  . promethazine (PHENERGAN) 12.5 MG tablet Take 1 tablet (12.5 mg total) by mouth every 8 (eight) hours as needed for nausea or vomiting. 20 tablet 0  . sennosides-docusate sodium (SENOKOT-S) 8.6-50 MG tablet Take 2 tablets by mouth daily. 30 tablet 1  . triamcinolone cream (KENALOG) 0.5 % APPLY TO AFFECTED AREA TOPICALLY 4 TIMES A DAY 30 g 0  . Vitamin D, Ergocalciferol, (DRISDOL) 50000 units CAPS capsule TAKE 1 CAPSULE (50,000 UNITS TOTAL) BY MOUTH ONCE A WEEK. 6 capsule 0  . azithromycin (ZITHROMAX Z-PAK) 250 MG tablet Take 1 tablet (250 mg total) by mouth daily. Take 2tabs on first day, then 1tab once a day till complete 6 tablet 0   No facility-administered medications prior to visit.     ROS Review of Systems  Constitutional: Negative for activity change,  appetite change, chills, fatigue and unexpected weight change.  HENT: Positive for congestion, postnasal drip, rhinorrhea and sinus pressure. Negative for mouth sores.   Eyes: Negative for visual disturbance.  Respiratory: Positive for cough. Negative for chest tightness.   Gastrointestinal: Negative for abdominal pain and nausea.  Genitourinary: Negative for difficulty urinating, frequency and vaginal pain.  Musculoskeletal: Negative for back pain and gait problem.  Skin: Negative for pallor and rash.  Neurological: Negative for dizziness, tremors, weakness, numbness and headaches.  Psychiatric/Behavioral: Negative for confusion and sleep disturbance.    Objective:  BP 108/64 (BP Location: Left Arm, Patient Position: Sitting, Cuff Size: Large)   Pulse 75   Temp 98.6 F (37 C) (Oral)   Ht 5' (1.524 m)   Wt 169 lb 0.6 oz (76.7 kg)   SpO2 100%   BMI 33.01 kg/m   BP Readings from Last 3 Encounters:  03/06/17 108/64  12/01/16 110/72  11/10/16 101/63    Wt Readings from Last 3 Encounters:  03/06/17 169 lb 0.6 oz (76.7 kg)  12/01/16 162 lb (73.5 kg)  10/28/16 170 lb (77.1 kg)    Physical Exam  Constitutional: She appears well-developed. No distress.  HENT:  Head: Normocephalic.  Right Ear: External ear normal.  Left Ear: External ear normal.  Nose: Nose normal.  Mouth/Throat: Oropharynx is clear and moist.  Eyes: Conjunctivae are normal. Pupils are equal, round, and reactive  to light. Right eye exhibits no discharge. Left eye exhibits no discharge.  Neck: Normal range of motion. Neck supple. No JVD present. No tracheal deviation present. No thyromegaly present.  Cardiovascular: Normal rate, regular rhythm and normal heart sounds.   Pulmonary/Chest: No stridor. No respiratory distress. She has no wheezes.  Abdominal: Soft. Bowel sounds are normal. She exhibits no distension and no mass. There is no tenderness. There is no rebound and no guarding.  Musculoskeletal: She  exhibits no edema or tenderness.  Lymphadenopathy:    She has no cervical adenopathy.  Neurological: She displays normal reflexes. No cranial nerve deficit. She exhibits normal muscle tone. Coordination normal.  Skin: No rash noted. No erythema.  Psychiatric: She has a normal mood and affect. Her behavior is normal. Judgment and thought content normal.  eryth nasal mucosa  Lab Results  Component Value Date   WBC 7.6 10/28/2016   HGB 12.4 10/28/2016   HCT 37.6 10/28/2016   PLT 294 10/28/2016   GLUCOSE 86 08/03/2016   ALT 8 08/03/2016   AST 13 08/03/2016   NA 136 08/03/2016   K 3.4 (L) 08/03/2016   CL 104 08/03/2016   CREATININE 0.63 08/03/2016   BUN 9 08/03/2016   CO2 26 08/03/2016   TSH 1.18 08/03/2016    No results found.  Assessment & Plan:   There are no diagnoses linked to this encounter. I have discontinued Ms. Rabenold's azithromycin. I am also having her maintain her cholecalciferol, sennosides-docusate sodium, promethazine, triamcinolone cream, etonogestrel-ethinyl estradiol, CITRANATAL BLOOM, guaiFENesin, fluticasone, oxymetazoline, Olopatadine HCl, and Vitamin D (Ergocalciferol).  No orders of the defined types were placed in this encounter.    Follow-up: No Follow-up on file.  Sonda Primes, MD

## 2017-03-06 NOTE — Telephone Encounter (Signed)
RX faxed

## 2017-03-06 NOTE — Telephone Encounter (Signed)
RX loaded, please sign 

## 2017-03-06 NOTE — Assessment & Plan Note (Signed)
Zpac 

## 2017-03-08 ENCOUNTER — Other Ambulatory Visit: Payer: Self-pay

## 2017-03-08 MED ORDER — PROMETHAZINE-CODEINE 6.25-10 MG/5ML PO SYRP: 5.0000 mL | ORAL_SOLUTION | Freq: Four times a day (QID) | ORAL | 0 refills | Status: DC | PRN

## 2017-03-27 ENCOUNTER — Other Ambulatory Visit: Payer: Self-pay | Admitting: Internal Medicine

## 2017-07-11 DIAGNOSIS — Z23 Encounter for immunization: Secondary | ICD-10-CM | POA: Diagnosis not present

## 2017-10-30 ENCOUNTER — Ambulatory Visit: Payer: BLUE CROSS/BLUE SHIELD | Admitting: Internal Medicine

## 2017-10-30 ENCOUNTER — Other Ambulatory Visit (INDEPENDENT_AMBULATORY_CARE_PROVIDER_SITE_OTHER): Payer: BLUE CROSS/BLUE SHIELD

## 2017-10-30 ENCOUNTER — Encounter: Payer: Self-pay | Admitting: Internal Medicine

## 2017-10-30 DIAGNOSIS — E559 Vitamin D deficiency, unspecified: Secondary | ICD-10-CM | POA: Diagnosis not present

## 2017-10-30 DIAGNOSIS — R5382 Chronic fatigue, unspecified: Secondary | ICD-10-CM

## 2017-10-30 DIAGNOSIS — D500 Iron deficiency anemia secondary to blood loss (chronic): Secondary | ICD-10-CM

## 2017-10-30 DIAGNOSIS — L659 Nonscarring hair loss, unspecified: Secondary | ICD-10-CM | POA: Diagnosis not present

## 2017-10-30 LAB — CBC WITH DIFFERENTIAL/PLATELET
BASOS PCT: 1.1 % (ref 0.0–3.0)
Basophils Absolute: 0.1 10*3/uL (ref 0.0–0.1)
EOS PCT: 9.1 % — AB (ref 0.0–5.0)
Eosinophils Absolute: 0.7 10*3/uL (ref 0.0–0.7)
HCT: 37.9 % (ref 36.0–46.0)
Hemoglobin: 12.3 g/dL (ref 12.0–15.0)
LYMPHS ABS: 3 10*3/uL (ref 0.7–4.0)
Lymphocytes Relative: 38.3 % (ref 12.0–46.0)
MCHC: 32.4 g/dL (ref 30.0–36.0)
MCV: 87.5 fl (ref 78.0–100.0)
MONOS PCT: 7.7 % (ref 3.0–12.0)
Monocytes Absolute: 0.6 10*3/uL (ref 0.1–1.0)
NEUTROS PCT: 43.8 % (ref 43.0–77.0)
Neutro Abs: 3.5 10*3/uL (ref 1.4–7.7)
Platelets: 305 10*3/uL (ref 150.0–400.0)
RBC: 4.33 Mil/uL (ref 3.87–5.11)
RDW: 13.9 % (ref 11.5–15.5)
WBC: 7.9 10*3/uL (ref 4.0–10.5)

## 2017-10-30 LAB — VITAMIN B12: Vitamin B-12: 419 pg/mL (ref 211–911)

## 2017-10-30 LAB — BASIC METABOLIC PANEL
BUN: 13 mg/dL (ref 6–23)
CHLORIDE: 108 meq/L (ref 96–112)
CO2: 22 meq/L (ref 19–32)
Calcium: 8.6 mg/dL (ref 8.4–10.5)
Creatinine, Ser: 0.77 mg/dL (ref 0.40–1.20)
GFR: 112.96 mL/min (ref >60.00)
GLUCOSE: 80 mg/dL (ref 70–99)
POTASSIUM: 3.8 meq/L (ref 3.5–5.1)
SODIUM: 139 meq/L (ref 135–145)

## 2017-10-30 LAB — VITAMIN D 25 HYDROXY (VIT D DEFICIENCY, FRACTURES): VITD: 13.28 ng/mL — ABNORMAL LOW (ref 30.00–100.00)

## 2017-10-30 MED ORDER — VITAMIN D (ERGOCALCIFEROL) 1.25 MG (50000 UNIT) PO CAPS: 50000.0000 [IU] | ORAL_CAPSULE | ORAL | 6 refills | Status: DC

## 2017-10-30 NOTE — Progress Notes (Signed)
Subjective:  Patient ID: [name], female    DOB: July 16, 1986  Age: 31 y.o. MRN: [id number]  CC: No chief complaint on file.   HPI [name] presents for anemia, low vit D. C/o hair loss - recurrent...  Outpatient Medications Prior to Visit  Medication Sig Dispense Refill  . cholecalciferol (VITAMIN D) 1000 UNITS tablet Take 1,000 Units by mouth daily.      Marland Kitchen. etonogestrel-ethinyl estradiol (NUVARING) 0.12-0.015 MG/24HR vaginal ring Insert vaginally and leave in place for 3 consecutive weeks, then remove for 1 week. 1 each 12  . fluticasone (FLONASE) 50 MCG/ACT nasal spray Place 2 sprays into both nostrils daily. 16 g 0  . guaiFENesin (MUCINEX) 600 MG 12 hr tablet Take 1 tablet (600 mg total) by mouth 2 (two) times daily as needed for cough or to loosen phlegm. 14 tablet 0  . Olopatadine HCl 0.2 % SOLN Apply 1 drop to eye 2 (two) times daily. 2.5 mL 0  . oxymetazoline (AFRIN NASAL SPRAY) 0.05 % nasal spray Place 1 spray into both nostrils 2 (two) times daily. Use only for 3days, then stop 30 mL 0  . Prenatal-DSS-FeCb-FeGl-FA (CITRANATAL BLOOM) 90-1 MG TABS Take 1 tablet by mouth daily before breakfast. 30 tablet 11  . promethazine (PHENERGAN) 12.5 MG tablet Take 1 tablet (12.5 mg total) by mouth every 8 (eight) hours as needed for nausea or vomiting. 20 tablet 0  . sennosides-docusate sodium (SENOKOT-S) 8.6-50 MG tablet Take 2 tablets by mouth daily. 30 tablet 1  . triamcinolone cream (KENALOG) 0.5 % APPLY TO AFFECTED AREA TOPICALLY 4 TIMES A DAY 30 g 0  . Vitamin D, Ergocalciferol, (DRISDOL) 50000 units CAPS capsule TAKE 1 CAPSULE (50,000 UNITS TOTAL) BY MOUTH ONCE A WEEK. 6 capsule 0  . azithromycin (ZITHROMAX Z-PAK) 250 MG tablet Take 1 tablet (250 mg total) by mouth daily. Take 2tabs on first day, then 1tab once a day till complete 6 tablet 0  . promethazine-codeine (PHENERGAN WITH CODEINE) 6.25-10 MG/5ML syrup Take 5 mLs by mouth every 6 (six) hours as needed for cough. 120 mL 0     No facility-administered medications prior to visit.     ROS Review of Systems  Constitutional: Positive for fatigue. Negative for activity change, appetite change, chills and unexpected weight change.  HENT: Negative for congestion, mouth sores and sinus pressure.   Eyes: Negative for visual disturbance.  Respiratory: Negative for cough and chest tightness.   Gastrointestinal: Negative for abdominal pain and nausea.  Genitourinary: Negative for difficulty urinating, frequency and vaginal pain.  Musculoskeletal: Negative for back pain and gait problem.  Skin: Negative for pallor and rash.  Neurological: Negative for dizziness, tremors, weakness, numbness and headaches.  Psychiatric/Behavioral: Negative for confusion and sleep disturbance.    Objective:  BP 122/68 (BP Location: Left Arm, Patient Position: Sitting, Cuff Size: Large)   Pulse 92   Temp 98.5 F (36.9 C) (Oral)   Ht 5' (1.524 m)   Wt 170 lb (77.1 kg)   SpO2 99%   BMI 33.20 kg/m   BP Readings from Last 3 Encounters:  10/30/17 122/68  03/06/17 108/64  12/01/16 110/72    Wt Readings from Last 3 Encounters:  10/30/17 170 lb (77.1 kg)  03/06/17 169 lb 0.6 oz (76.7 kg)  12/01/16 162 lb (73.5 kg)    Physical Exam  Constitutional: She appears well-developed. No distress.  HENT:  Head: Normocephalic.  Right Ear: External ear normal.  Left Ear: External ear normal.  Nose: Nose normal.  Mouth/Throat: Oropharynx is clear and moist.  Eyes: Conjunctivae are normal. Pupils are equal, round, and reactive to light. Right eye exhibits no discharge. Left eye exhibits no discharge.  Neck: Normal range of motion. Neck supple. No JVD present. No tracheal deviation present. No thyromegaly present.  Cardiovascular: Normal rate, regular rhythm and normal heart sounds.  Pulmonary/Chest: No stridor. No respiratory distress. She has no wheezes.  Abdominal: Soft. Bowel sounds are normal. She exhibits no distension and no mass.  There is no tenderness. There is no rebound and no guarding.  Musculoskeletal: She exhibits no edema or tenderness.  Lymphadenopathy:    She has no cervical adenopathy.  Neurological: She displays normal reflexes. No cranial nerve deficit. She exhibits normal muscle tone. Coordination normal.  Skin: No rash noted. No erythema.  Psychiatric: She has a normal mood and affect. Her behavior is normal. Judgment and thought content normal.    Lab Results  Component Value Date   WBC 7.6 10/28/2016   HGB 12.4 10/28/2016   HCT 37.6 10/28/2016   PLT 294 10/28/2016   GLUCOSE 86 08/03/2016   ALT 8 08/03/2016   AST 13 08/03/2016   NA 136 08/03/2016   K 3.4 (L) 08/03/2016   CL 104 08/03/2016   CREATININE 0.63 08/03/2016   BUN 9 08/03/2016   CO2 26 08/03/2016   TSH 1.18 08/03/2016    No results found.  Assessment & Plan:   There are no diagnoses linked to this encounter. I have discontinued [name]'s azithromycin and promethazine-codeine. I am also having her maintain her cholecalciferol, sennosides-docusate sodium, promethazine, triamcinolone cream, etonogestrel-ethinyl estradiol, CITRANATAL BLOOM, guaiFENesin, fluticasone, oxymetazoline, Olopatadine HCl, and Vitamin D (Ergocalciferol).  No orders of the defined types were placed in this encounter.    Follow-up: No Follow-up on file.  Sonda PrimesAlex Jannetta Massey, MD

## 2017-10-30 NOTE — Assessment & Plan Note (Addendum)
Labs  Monthly Rx

## 2017-10-30 NOTE — Assessment & Plan Note (Signed)
Labs

## 2017-11-01 NOTE — Assessment & Plan Note (Signed)
labs

## 2017-11-23 ENCOUNTER — Ambulatory Visit: Payer: BLUE CROSS/BLUE SHIELD | Admitting: Obstetrics

## 2018-02-13 ENCOUNTER — Other Ambulatory Visit: Payer: Self-pay | Admitting: Nurse Practitioner

## 2018-02-13 DIAGNOSIS — J0140 Acute pansinusitis, unspecified: Secondary | ICD-10-CM

## 2018-02-13 DIAGNOSIS — B309 Viral conjunctivitis, unspecified: Secondary | ICD-10-CM

## 2018-02-13 MED ORDER — FLUTICASONE PROPIONATE 50 MCG/ACT NA SUSP: 2.0000 | Freq: Every day | NASAL | 2 refills | Status: DC

## 2018-02-13 MED ORDER — OLOPATADINE HCL 0.2 % OP SOLN: 1.0000 [drp] | Freq: Two times a day (BID) | OPHTHALMIC | 0 refills | Status: DC

## 2018-02-22 ENCOUNTER — Other Ambulatory Visit (INDEPENDENT_AMBULATORY_CARE_PROVIDER_SITE_OTHER): Payer: BLUE CROSS/BLUE SHIELD

## 2018-02-22 ENCOUNTER — Ambulatory Visit: Payer: BLUE CROSS/BLUE SHIELD | Admitting: Internal Medicine

## 2018-02-22 ENCOUNTER — Encounter: Payer: Self-pay | Admitting: Internal Medicine

## 2018-02-22 DIAGNOSIS — D500 Iron deficiency anemia secondary to blood loss (chronic): Secondary | ICD-10-CM | POA: Diagnosis not present

## 2018-02-22 DIAGNOSIS — E559 Vitamin D deficiency, unspecified: Secondary | ICD-10-CM | POA: Diagnosis not present

## 2018-02-22 DIAGNOSIS — L659 Nonscarring hair loss, unspecified: Secondary | ICD-10-CM

## 2018-02-22 LAB — BASIC METABOLIC PANEL
BUN: 15 mg/dL (ref 6–23)
CHLORIDE: 104 meq/L (ref 96–112)
CO2: 28 mEq/L (ref 19–32)
Calcium: 9.3 mg/dL (ref 8.4–10.5)
Creatinine, Ser: 0.86 mg/dL (ref 0.40–1.20)
GFR: 99.22 mL/min (ref >60.00)
Glucose, Bld: 75 mg/dL (ref 70–99)
POTASSIUM: 4 meq/L (ref 3.5–5.1)
SODIUM: 139 meq/L (ref 135–145)

## 2018-02-22 LAB — CBC WITH DIFFERENTIAL/PLATELET
BASOS ABS: 0.1 10*3/uL (ref 0.0–0.1)
BASOS PCT: 0.9 % (ref 0.0–3.0)
Eosinophils Absolute: 0.5 10*3/uL (ref 0.0–0.7)
Eosinophils Relative: 6.6 % — ABNORMAL HIGH (ref 0.0–5.0)
HCT: 37.5 % (ref 36.0–46.0)
Hemoglobin: 12.2 g/dL (ref 12.0–15.0)
Lymphocytes Relative: 37.3 % (ref 12.0–46.0)
Lymphs Abs: 3.1 10*3/uL (ref 0.7–4.0)
MCHC: 32.6 g/dL (ref 30.0–36.0)
MCV: 84.9 fl (ref 78.0–100.0)
MONO ABS: 0.7 10*3/uL (ref 0.1–1.0)
Monocytes Relative: 8.5 % (ref 3.0–12.0)
NEUTROS ABS: 3.8 10*3/uL (ref 1.4–7.7)
NEUTROS PCT: 46.7 % (ref 43.0–77.0)
PLATELETS: 283 10*3/uL (ref 150.0–400.0)
RBC: 4.41 Mil/uL (ref 3.87–5.11)
RDW: 13.4 % (ref 11.5–15.5)
WBC: 8.3 10*3/uL (ref 4.0–10.5)

## 2018-02-22 LAB — HEPATIC FUNCTION PANEL
ALBUMIN: 4 g/dL (ref 3.5–5.2)
ALK PHOS: 62 U/L (ref 39–117)
ALT: 13 U/L (ref 0–35)
AST: 14 U/L (ref 0–37)
BILIRUBIN DIRECT: 0.1 mg/dL (ref 0.0–0.3)
TOTAL PROTEIN: 7 g/dL (ref 6.0–8.3)
Total Bilirubin: 0.3 mg/dL (ref 0.2–1.2)

## 2018-02-22 LAB — VITAMIN D 25 HYDROXY (VIT D DEFICIENCY, FRACTURES): VITD: 16.55 ng/mL — ABNORMAL LOW (ref 30.00–100.00)

## 2018-02-22 LAB — TSH: TSH: 2.15 u[IU]/mL (ref 0.35–4.50)

## 2018-02-22 LAB — VITAMIN B12: Vitamin B-12: 408 pg/mL (ref 211–911)

## 2018-02-22 NOTE — Assessment & Plan Note (Signed)
Labs

## 2018-02-22 NOTE — Progress Notes (Signed)
   Subjective:  Patient ID: [name], female    DOB: September 26, 1986  Age: 32 y.o. MRN: [id number]  CC: No chief complaint on file.   HPI Courtney PhoDawn D Kaatz presents for hair loss, Vit D def, allergies, anemia Pt had IV iron last year  Outpatient Medications Prior to Visit  Medication Sig Dispense Refill  . etonogestrel-ethinyl estradiol (NUVARING) 0.12-0.015 MG/24HR vaginal ring Insert vaginally and leave in place for 3 consecutive weeks, then remove for 1 week. 1 each 12  . fluticasone (FLONASE) 50 MCG/ACT nasal spray Place 2 sprays into both nostrils daily. 16 g 2  . guaiFENesin (MUCINEX) 600 MG 12 hr tablet Take 1 tablet (600 mg total) by mouth 2 (two) times daily as needed for cough or to loosen phlegm. 14 tablet 0  . Olopatadine HCl 0.2 % SOLN Apply 1 drop to eye 2 (two) times daily. 2.5 mL 0  . oxymetazoline (AFRIN NASAL SPRAY) 0.05 % nasal spray Place 1 spray into both nostrils 2 (two) times daily. Use only for 3days, then stop 30 mL 0  . Prenatal-DSS-FeCb-FeGl-FA (CITRANATAL BLOOM) 90-1 MG TABS Take 1 tablet by mouth daily before breakfast. 30 tablet 11  . promethazine (PHENERGAN) 12.5 MG tablet Take 1 tablet (12.5 mg total) by mouth every 8 (eight) hours as needed for nausea or vomiting. 20 tablet 0  . sennosides-docusate sodium (SENOKOT-S) 8.6-50 MG tablet Take 2 tablets by mouth daily. 30 tablet 1  . triamcinolone cream (KENALOG) 0.5 % APPLY TO AFFECTED AREA TOPICALLY 4 TIMES A DAY 30 g 0  . Vitamin D, Ergocalciferol, (DRISDOL) 50000 units CAPS capsule Take 1 capsule (50,000 Units total) by mouth every 30 (thirty) days. 3 capsule 6   No facility-administered medications prior to visit.     ROS Review of Systems  Objective:  BP 116/74 (BP Location: Left Arm, Patient Position: Sitting, Cuff Size: Large)   Pulse 74   Temp 99.2 F (37.3 C) (Oral)   Ht 5' (1.524 m)   Wt 165 lb (74.8 kg)   SpO2 99%   BMI 32.22 kg/m   BP Readings from Last 3 Encounters:  02/22/18 116/74    10/30/17 122/68  03/06/17 108/64    Wt Readings from Last 3 Encounters:  02/22/18 165 lb (74.8 kg)  10/30/17 170 lb (77.1 kg)  03/06/17 169 lb 0.6 oz (76.7 kg)    Physical Exam  Lab Results  Component Value Date   WBC 7.9 10/30/2017   HGB 12.3 10/30/2017   HCT 37.9 10/30/2017   PLT 305.0 10/30/2017   GLUCOSE 80 10/30/2017   ALT 8 08/03/2016   AST 13 08/03/2016   NA 139 10/30/2017   K 3.8 10/30/2017   CL 108 10/30/2017   CREATININE 0.77 10/30/2017   BUN 13 10/30/2017   CO2 22 10/30/2017   TSH 1.18 08/03/2016    No results found.  Assessment & Plan:   There are no diagnoses linked to this encounter. I am having Ryker D. Elmore maintain her sennosides-docusate sodium, promethazine, triamcinolone cream, etonogestrel-ethinyl estradiol, CITRANATAL BLOOM, guaiFENesin, oxymetazoline, Vitamin D (Ergocalciferol), fluticasone, and Olopatadine HCl.  No orders of the defined types were placed in this encounter.    Follow-up: No follow-ups on file.  Sonda PrimesAlex Dayvin Aber, MD

## 2018-02-23 LAB — IRON,TIBC AND FERRITIN PANEL
%SAT: 5 % — AB (ref 11–50)
FERRITIN: 11 ng/mL (ref 10–154)
Iron: 18 ug/dL — ABNORMAL LOW (ref 40–190)
TIBC: 374 ug/dL (ref 250–450)

## 2018-02-25 ENCOUNTER — Other Ambulatory Visit: Payer: Self-pay | Admitting: Internal Medicine

## 2018-02-25 DIAGNOSIS — D500 Iron deficiency anemia secondary to blood loss (chronic): Secondary | ICD-10-CM

## 2018-02-25 MED ORDER — VITAMIN D (ERGOCALCIFEROL) 1.25 MG (50000 UNIT) PO CAPS: ORAL_CAPSULE | ORAL | 6 refills | Status: DC

## 2018-03-02 ENCOUNTER — Telehealth: Payer: BLUE CROSS/BLUE SHIELD | Admitting: Family

## 2018-03-02 DIAGNOSIS — T63484A Toxic effect of venom of other arthropod, undetermined, initial encounter: Secondary | ICD-10-CM

## 2018-03-02 MED ORDER — PREDNISONE 10 MG (21) PO TBPK: ORAL_TABLET | ORAL | 0 refills | Status: DC

## 2018-03-02 NOTE — Progress Notes (Signed)
E Visit for Insect Sting  Thank you for describing the insect sting for us.  Here is how we plan to help!  A sting that we will treat with a short course of prednisone.  The 2 greatest risks from insect stings are allergic reaction, which can be fatal in some people and infection, which is more common and less serious.  Bees, wasps, yellow jackets, and hornets belong to a class of insects called Hymenoptera.  Most insect stings cause only minor discomfort.  Stings can happen anywhere on the body and can be painful.  Most stings are from honey bees or yellow jackets.  Fire ants can sting multiple times.  The sites of the stings are more likely to become infected.    Based on your information I have:, Provided a home care guide for insect stings and instructions on when to call for help. and I have sent in prednisone 10 mg tapering dose for 5 days to the pharmacy you selected.  Please make sure that you selected a pharmacy that is open now.  What can be used to prevent Insect Stings?   Insect repellant with at least 20% DEET.    Wearing long pants and shirts with socks and shoes.    Wear dark or drab-colored clothes rather than bright colors.    Avoid using perfumes and hair sprays; these attract insects.  HOME CARE ADVICE:  1. Stinger removal:  The stinger looks like a tiny black dot in the sting.  Use a fingernail, credit card edge, or knife-edge to scrape it off.  Don't pull it out because it squeezes out more venom.  If the stinger is below the skin surface, leave it alone.  It will be shed with normal skin healing. 2. Use cold compresses to the area of the sting for 10-20 minutes.  You may repeat this as needed to relieve symptoms of pain and swelling. 3.  For pain relief, take acetominophen 650 mg 4-6 hours as needed or ibuprofen 400 mg every 6-8 hours as needed or naproxen 250-500 mg every 12 hours as needed. 4.  You can also use hydrocortisone cream 0.5% or 1% up to 4  times daily as needed for itching. 5.  If the sting becomes very itchy, take Benadryl 25-50 mg, follow directions on box. 6.  Wash the area 2-3 times daily with antibacterial soap and warm water. 7. Call your Doctor if:  Fever, a severe headache, or rash occur in the next 2 weeks.  Sting area begins to look infected.  Redness and swelling worsens after home treatment.  Your current symptoms become worse.    MAKE SURE YOU:   Understand these instructions.  Will watch your condition.  Will get help right away if you are not doing well or get worse.  Thank you for choosing an e-visit. Your e-visit answers were reviewed by a board certified advanced clinical practitioner to complete your personal care plan. Depending upon the condition, your plan could have included both over the counter or prescription medications. Please review your pharmacy choice. Be sure that the pharmacy you have chosen is open so that you can pick up your prescription now.  If there is a problem you may message your provider in MyChart to have the prescription routed to another pharmacy. Your safety is important to us. If you have drug allergies check your prescription carefully.  For the next 24 hours, you can use MyChart to ask questions about today's visit, request a  non-urgent call back, or ask for a work or school excuse from your e-visit provider. You will get an email in the next two days asking about your experience. I hope that your e-visit has been valuable and will speed your recovery.

## 2018-03-19 ENCOUNTER — Other Ambulatory Visit: Payer: Self-pay | Admitting: Internal Medicine

## 2018-04-30 ENCOUNTER — Encounter: Payer: Self-pay | Admitting: Internal Medicine

## 2018-05-03 ENCOUNTER — Encounter: Payer: Self-pay | Admitting: Internal Medicine

## 2018-05-04 ENCOUNTER — Encounter: Payer: Self-pay | Admitting: Internal Medicine

## 2018-05-04 ENCOUNTER — Other Ambulatory Visit (INDEPENDENT_AMBULATORY_CARE_PROVIDER_SITE_OTHER): Payer: BLUE CROSS/BLUE SHIELD

## 2018-05-04 ENCOUNTER — Ambulatory Visit: Payer: BLUE CROSS/BLUE SHIELD | Admitting: Internal Medicine

## 2018-05-04 VITALS — BP 126/78 | HR 76 | Temp 99.4°F | Ht 60.0 in | Wt 174.0 lb

## 2018-05-04 DIAGNOSIS — L659 Nonscarring hair loss, unspecified: Secondary | ICD-10-CM

## 2018-05-04 DIAGNOSIS — R079 Chest pain, unspecified: Secondary | ICD-10-CM | POA: Diagnosis not present

## 2018-05-04 DIAGNOSIS — D500 Iron deficiency anemia secondary to blood loss (chronic): Secondary | ICD-10-CM | POA: Diagnosis not present

## 2018-05-04 DIAGNOSIS — E559 Vitamin D deficiency, unspecified: Secondary | ICD-10-CM | POA: Diagnosis not present

## 2018-05-04 DIAGNOSIS — R0789 Other chest pain: Secondary | ICD-10-CM | POA: Diagnosis not present

## 2018-05-04 LAB — CBC WITH DIFFERENTIAL/PLATELET
Basophils Absolute: 0.1 10*3/uL (ref 0.0–0.1)
Basophils Relative: 0.7 % (ref 0.0–3.0)
EOS PCT: 3.7 % (ref 0.0–5.0)
Eosinophils Absolute: 0.3 10*3/uL (ref 0.0–0.7)
HCT: 31.9 % — ABNORMAL LOW (ref 36.0–46.0)
HEMOGLOBIN: 10.5 g/dL — AB (ref 12.0–15.0)
LYMPHS ABS: 3 10*3/uL (ref 0.7–4.0)
Lymphocytes Relative: 37.8 % (ref 12.0–46.0)
MCHC: 32.9 g/dL (ref 30.0–36.0)
MCV: 83.8 fl (ref 78.0–100.0)
MONOS PCT: 9 % (ref 3.0–12.0)
Monocytes Absolute: 0.7 10*3/uL (ref 0.1–1.0)
NEUTROS PCT: 48.8 % (ref 43.0–77.0)
Neutro Abs: 3.9 10*3/uL (ref 1.4–7.7)
Platelets: 313 10*3/uL (ref 150.0–400.0)
RBC: 3.81 Mil/uL — AB (ref 3.87–5.11)
RDW: 14.5 % (ref 11.5–15.5)
WBC: 8 10*3/uL (ref 4.0–10.5)

## 2018-05-04 LAB — TSH: TSH: 1.15 u[IU]/mL (ref 0.35–4.50)

## 2018-05-04 LAB — BASIC METABOLIC PANEL
BUN: 13 mg/dL (ref 6–23)
CHLORIDE: 105 meq/L (ref 96–112)
CO2: 28 mEq/L (ref 19–32)
CREATININE: 0.7 mg/dL (ref 0.40–1.20)
Calcium: 8.9 mg/dL (ref 8.4–10.5)
GFR: 125.66 mL/min (ref >60.00)
Glucose, Bld: 90 mg/dL (ref 70–99)
Potassium: 3.8 mEq/L (ref 3.5–5.1)
Sodium: 139 mEq/L (ref 135–145)

## 2018-05-04 LAB — HEPATIC FUNCTION PANEL
ALT: 9 U/L (ref 0–35)
AST: 12 U/L (ref 0–37)
Albumin: 4 g/dL (ref 3.5–5.2)
Alkaline Phosphatase: 60 U/L (ref 39–117)
BILIRUBIN DIRECT: 0.1 mg/dL (ref 0.0–0.3)
BILIRUBIN TOTAL: 0.3 mg/dL (ref 0.2–1.2)
TOTAL PROTEIN: 6.9 g/dL (ref 6.0–8.3)

## 2018-05-04 LAB — TROPONIN I: TNIDX: 0 ug/l (ref 0.00–0.06)

## 2018-05-04 LAB — IRON,TIBC AND FERRITIN PANEL
%SAT: 6 % (calc) — ABNORMAL LOW (ref 16–45)
Ferritin: 8 ng/mL — ABNORMAL LOW (ref 16–154)
IRON: 21 ug/dL — AB (ref 40–190)
TIBC: 378 mcg/dL (calc) (ref 250–450)

## 2018-05-04 NOTE — Assessment & Plan Note (Signed)
EKG, CXR Labs 2D ECHO

## 2018-05-04 NOTE — Progress Notes (Signed)
Subjective:  Patient ID: [name], female    DOB: 1986-03-14  Age: 32 y.o. MRN: [id number]  CC: No chief complaint on file.   HPI Courtney PhoDawn D Hooley presents for CP x 1 month off and on w/stress - every few days ie after drinking red bull. Last CP - now. [place] DOE, LOC  Outpatient Medications Prior to Visit  Medication Sig Dispense Refill  . etonogestrel-ethinyl estradiol (NUVARING) 0.12-0.015 MG/24HR vaginal ring Insert vaginally and leave in place for 3 consecutive weeks, then remove for 1 week. 1 each 12  . fluticasone (FLONASE) 50 MCG/ACT nasal spray Place 2 sprays into both nostrils daily. 16 g 2  . guaiFENesin (MUCINEX) 600 MG 12 hr tablet Take 1 tablet (600 mg total) by mouth 2 (two) times daily as needed for cough or to loosen phlegm. 14 tablet 0  . Olopatadine HCl 0.2 % SOLN Apply 1 drop to eye 2 (two) times daily. 2.5 mL 0  . oxymetazoline (AFRIN NASAL SPRAY) 0.05 % nasal spray Place 1 spray into both nostrils 2 (two) times daily. Use only for 3days, then stop 30 mL 0  . Prenatal-DSS-FeCb-FeGl-FA (CITRANATAL BLOOM) 90-1 MG TABS Take 1 tablet by mouth daily before breakfast. 30 tablet 11  . promethazine (PHENERGAN) 12.5 MG tablet Take 1 tablet (12.5 mg total) by mouth every 8 (eight) hours as needed for nausea or vomiting. 20 tablet 0  . Promethazine-Codeine 6.25-10 MG/5ML SOLN Take 5ml by mouth every 6 hours as needed for cough 120 mL 0  . sennosides-docusate sodium (SENOKOT-S) 8.6-50 MG tablet Take 2 tablets by mouth daily. 30 tablet 1  . triamcinolone cream (KENALOG) 0.5 % APPLY TO AFFECTED AREA TOPICALLY 4 TIMES A DAY 30 g 0  . Vitamin D, Ergocalciferol, (DRISDOL) 50000 units CAPS capsule Take 1 capsule weekly for 8 weeks, then 1.capsule every 2 weeks 8 capsule 6  . predniSONE (STERAPRED UNI-PAK 21 TAB) 10 MG (21) TBPK tablet Use as directed 21 tablet 0   No facility-administered medications prior to visit.     ROS: Review of Systems    Constitutional: Negative for activity change, appetite change, chills, fatigue and unexpected weight change.  HENT: Negative for congestion, mouth sores and sinus pressure.   Eyes: Negative for visual disturbance.  Respiratory: Positive for chest tightness. Negative for cough.   Cardiovascular: Positive for chest pain.  Gastrointestinal: Negative for abdominal pain and nausea.  Genitourinary: Negative for difficulty urinating, frequency and vaginal pain.  Musculoskeletal: Negative for back pain and gait problem.  Skin: Negative for pallor and rash.  Neurological: Negative for dizziness, tremors, weakness, numbness and headaches.  Psychiatric/Behavioral: Negative for confusion and sleep disturbance.    Objective:  BP 126/78 (BP Location: Left Arm, Patient Position: Sitting, Cuff Size: Large)   Pulse 76   Temp 99.4 F (37.4 C) (Oral)   Ht 5' (1.524 m)   Wt 174 lb (78.9 kg)   SpO2 99%   BMI 33.98 kg/m   BP Readings from Last 3 Encounters:  05/04/18 126/78  02/22/18 116/74  10/30/17 122/68    Wt Readings from Last 3 Encounters:  05/04/18 174 lb (78.9 kg)  02/22/18 165 lb (74.8 kg)  10/30/17 170 lb (77.1 kg)    Physical Exam  Constitutional: She appears well-developed. No distress.  HENT:  Head: Normocephalic.  Right Ear: External ear normal.  Left Ear: External ear normal.  Nose: Nose normal.  Mouth/Throat: Oropharynx is clear and moist.  Eyes:  Pupils are equal, round, and reactive to light. Conjunctivae are normal. Right eye exhibits no discharge. Left eye exhibits no discharge.  Neck: Normal range of motion. Neck supple. No JVD present. No tracheal deviation present. No thyromegaly present.  Cardiovascular: Normal rate, regular rhythm and normal heart sounds.  Pulmonary/Chest: No stridor. No respiratory distress. She has no wheezes.  Abdominal: Soft. Bowel sounds are normal. She exhibits no distension and no mass. There is no tenderness. There is no rebound and no  guarding.  Musculoskeletal: She exhibits no edema or tenderness.  Lymphadenopathy:    She has no cervical adenopathy.  Neurological: She displays normal reflexes. No cranial nerve deficit. She exhibits normal muscle tone. Coordination normal.  Skin: No rash noted. No erythema.  Psychiatric: She has a normal mood and affect. Her behavior is normal. Judgment and thought content normal.  Chest wall NT  Procedure: EKG Indication: chest pain Impression: NSR. No acute changes.  Lab Results  Component Value Date   WBC 8.3 02/22/2018   HGB 12.2 02/22/2018   HCT 37.5 02/22/2018   PLT 283.0 02/22/2018   GLUCOSE 75 02/22/2018   ALT 13 02/22/2018   AST 14 02/22/2018   NA 139 02/22/2018   K 4.0 02/22/2018   CL 104 02/22/2018   CREATININE 0.86 02/22/2018   BUN 15 02/22/2018   CO2 28 02/22/2018   TSH 2.15 02/22/2018    No results found.  Assessment & Plan:   There are no diagnoses linked to this encounter.   No orders of the defined types were placed in this encounter.    Follow-up: No follow-ups on file.  Sonda Primes, MD

## 2018-05-04 NOTE — Assessment & Plan Note (Signed)
On Vit D 

## 2018-05-04 NOTE — Assessment & Plan Note (Signed)
Needs IV iron

## 2018-05-04 NOTE — Assessment & Plan Note (Signed)
Sch appt for IV iron

## 2018-05-05 LAB — D-DIMER, QUANTITATIVE: D-Dimer, Quant: 0.32 mcg/mL FEU (ref <0.50)

## 2018-05-15 ENCOUNTER — Encounter (HOSPITAL_COMMUNITY): Payer: Self-pay | Admitting: Radiology

## 2018-05-16 ENCOUNTER — Other Ambulatory Visit: Payer: Self-pay | Admitting: Family

## 2018-05-17 ENCOUNTER — Inpatient Hospital Stay: Payer: BLUE CROSS/BLUE SHIELD

## 2018-05-17 ENCOUNTER — Other Ambulatory Visit: Payer: Self-pay

## 2018-05-17 ENCOUNTER — Encounter: Payer: Self-pay | Admitting: Family

## 2018-05-17 ENCOUNTER — Inpatient Hospital Stay: Payer: BLUE CROSS/BLUE SHIELD | Attending: Hematology & Oncology

## 2018-05-17 ENCOUNTER — Inpatient Hospital Stay (HOSPITAL_BASED_OUTPATIENT_CLINIC_OR_DEPARTMENT_OTHER): Payer: BLUE CROSS/BLUE SHIELD | Admitting: Family

## 2018-05-17 VITALS — BP 109/62 | HR 76 | Temp 98.4°F | Resp 18 | Wt 174.0 lb

## 2018-05-17 VITALS — BP 109/54

## 2018-05-17 DIAGNOSIS — N920 Excessive and frequent menstruation with regular cycle: Secondary | ICD-10-CM

## 2018-05-17 DIAGNOSIS — D500 Iron deficiency anemia secondary to blood loss (chronic): Secondary | ICD-10-CM | POA: Insufficient documentation

## 2018-05-17 MED ORDER — SODIUM CHLORIDE 0.9 % IV SOLN
510.0000 mg | Freq: Once | INTRAVENOUS | Status: AC
  Administered 2018-05-17: 510 mg via INTRAVENOUS
  Filled 2018-05-17: qty 17

## 2018-05-17 NOTE — Progress Notes (Signed)
Patient refused to stay for 30 minute post iron observation. VSS. Patient without complaints or concerns. Patient discharged ambulatory.

## 2018-05-17 NOTE — Patient Instructions (Signed)

## 2018-05-17 NOTE — Progress Notes (Signed)
Hematology and Oncology Follow Up Visit  [name] [id number] [birth year] 32 y.o. 05/17/2018   Principle Diagnosis:  Iron deficiency anemia secondary to chronic blood loss due to menorrhagia  Current Therapy:   IV iron as indicated - last received in January 2018   Interim History:  [name] is here today for follow-up. She is symptomatic with fatigue, weakness, chills, chest discomfort and palpitations.  Her PCP has referred her to cardiology for further work-up regarding the chest pain and palpitations.  Her lab work last week showed an iron saturation is 6% and ferritin of 8. Hgb was 10.5 with an MCV of 83.  She states that her cycle is regular but heavy with lots of clots. No other bleeding, no bruising or petechiae.  No fever, chills, n/v, cough, rash, dizziness, SOB, chest pain, palpitations, abdominal pain or changes in bowel or bladder habits.  No swelling, tenderness, numbness or tingling in her extremities at this time. No c/o pain.  No lymphadenopathy noted on exam, no fluid wave  She has a good appetite and is staying well hydrated. Her weight is stable.   ECOG Performance Status: 1 - Symptomatic but completely ambulatory  Medications:  Allergies as of 05/17/2018   No Known Allergies     Medication List        Accurate as of 05/17/18  8:51 AM. Always use your most recent med list.          CITRANATAL BLOOM 90-1 MG Tabs Take 1 tablet by mouth daily before breakfast.   etonogestrel-ethinyl estradiol 0.12-0.015 MG/24HR vaginal ring Commonly known as:  NUVARING Insert vaginally and leave in place for 3 consecutive weeks, then remove for 1 week.   fluticasone 50 MCG/ACT nasal spray Commonly known as:  FLONASE Place 2 sprays into both nostrils daily.   guaiFENesin 600 MG 12 hr tablet Commonly known as:  MUCINEX Take 1 tablet (600 mg total) by mouth 2 (two) times daily as needed for cough or to loosen phlegm.   Olopatadine HCl 0.2 % Soln Apply 1 drop to  eye 2 (two) times daily.   oxymetazoline 0.05 % nasal spray Commonly known as:  AFRIN NASAL SPRAY Place 1 spray into both nostrils 2 (two) times daily. Use only for 3days, then stop   promethazine 12.5 MG tablet Commonly known as:  PHENERGAN Take 1 tablet (12.5 mg total) by mouth every 8 (eight) hours as needed for nausea or vomiting.   Promethazine-Codeine 6.25-10 MG/5ML Soln Take 5ml by mouth every 6 hours as needed for cough   sennosides-docusate sodium 8.6-50 MG tablet Commonly known as:  SENOKOT-S Take 2 tablets by mouth daily.   triamcinolone cream 0.5 % Commonly known as:  KENALOG APPLY TO AFFECTED AREA TOPICALLY 4 TIMES A DAY   Vitamin D (Ergocalciferol) 50000 units Caps capsule Commonly known as:  DRISDOL Take 1 capsule weekly for 8 weeks, then 1.capsule every 2 weeks       Allergies: No Known Allergies  Past Medical History, Surgical history, Social history, and Family History were reviewed and updated.  Review of Systems: All other 10 point review of systems is negative.   Physical Exam:  vitals were not taken for this visit.   Wt Readings from Last 3 Encounters:  05/04/18 174 lb (78.9 kg)  02/22/18 165 lb (74.8 kg)  10/30/17 170 lb (77.1 kg)    Ocular: Sclerae unicteric, pupils equal, round and reactive to light Ear-nose-throat: Oropharynx clear, dentition fair Lymphatic: No cervical, supraclavicular or  axillary adenopathy Lungs no rales or rhonchi, good excursion bilaterally Heart regular rate and rhythm, no murmur appreciated Abd soft, nontender, positive bowel sounds, no liver or spleen tip palpated on exam, no fluid wave  MSK no focal spinal tenderness, no joint edema Neuro: non-focal, well-oriented, appropriate affect Breasts: Deferred   Lab Results  Component Value Date   WBC 8.0 05/04/2018   HGB 10.5 (L) 05/04/2018   HCT 31.9 (L) 05/04/2018   MCV 83.8 05/04/2018   PLT 313.0 05/04/2018   Lab Results  Component Value Date   FERRITIN 8  (L) 05/04/2018   IRON 21 (L) 05/04/2018   TIBC 378 05/04/2018   UIBC 225 10/28/2016   IRONPCTSAT 6 (L) 05/04/2018   Lab Results  Component Value Date   RBC 3.81 (L) 05/04/2018   No results found for: KPAFRELGTCHN, LAMBDASER, [facility] Lab Results  Component Value Date   IGA 310 11/16/2009   No results found for: Dorene ArOTALPROTELP, ALBUMINELP, A1GS, A2GS, Colin BentonBETS, BETA2SER, GAMS, MSPIKE, SPEI   Chemistry      Component Value Date/Time   NA 139 05/04/2018 1520   K 3.8 05/04/2018 1520   CL 105 05/04/2018 1520   CO2 28 05/04/2018 1520   BUN 13 05/04/2018 1520   CREATININE 0.70 05/04/2018 1520      Component Value Date/Time   CALCIUM 8.9 05/04/2018 1520   ALKPHOS 60 05/04/2018 1520   AST 12 05/04/2018 1520   ALT 9 05/04/2018 1520   BILITOT 0.3 05/04/2018 1520      Impression and Plan: [name] is a very pleasant 32 yo African American female with iron deficiency anemia secondary to menorrhagia. Her iron saturation is 6% and ferritin 8. She is symptomatic as mentioned above.  I gave her the letter outlining cost of Feraheme prior to insurance and she understands that she may have a $300 or more copay. She verbalized understanding and will call her insurance for more information. She does states that she wants to get IV iron today.  We will bring her back in at 10:45a today for infusion and then schedule a second infusion for next week with follow-up in 8 weeks.  She is in agreement with the plan and will contact our office with any questions or concerns. We can certainly see her sooner if need be.   Emeline GinsSarah [place], NP 7/11/20198:51 AM

## 2018-05-24 ENCOUNTER — Inpatient Hospital Stay: Payer: BLUE CROSS/BLUE SHIELD

## 2018-05-31 ENCOUNTER — Other Ambulatory Visit: Payer: Self-pay

## 2018-05-31 ENCOUNTER — Inpatient Hospital Stay: Payer: BLUE CROSS/BLUE SHIELD

## 2018-05-31 VITALS — BP 109/60 | HR 69 | Temp 99.0°F | Resp 18

## 2018-05-31 DIAGNOSIS — D500 Iron deficiency anemia secondary to blood loss (chronic): Secondary | ICD-10-CM | POA: Diagnosis not present

## 2018-05-31 DIAGNOSIS — N920 Excessive and frequent menstruation with regular cycle: Secondary | ICD-10-CM | POA: Diagnosis not present

## 2018-05-31 MED ORDER — SODIUM CHLORIDE 0.9 % IV SOLN
Freq: Once | INTRAVENOUS | Status: AC
  Administered 2018-05-31: 12:00:00 via INTRAVENOUS
  Filled 2018-05-31: qty 250

## 2018-05-31 MED ORDER — SODIUM CHLORIDE 0.9 % IV SOLN
510.0000 mg | Freq: Once | INTRAVENOUS | Status: AC
  Administered 2018-05-31: 510 mg via INTRAVENOUS
  Filled 2018-05-31: qty 17

## 2018-05-31 NOTE — Patient Instructions (Signed)

## 2018-07-19 ENCOUNTER — Encounter: Payer: Self-pay | Admitting: Family

## 2018-07-19 ENCOUNTER — Inpatient Hospital Stay: Payer: BLUE CROSS/BLUE SHIELD | Attending: Hematology & Oncology | Admitting: Family

## 2018-07-19 ENCOUNTER — Other Ambulatory Visit: Payer: Self-pay

## 2018-07-19 ENCOUNTER — Inpatient Hospital Stay: Payer: BLUE CROSS/BLUE SHIELD

## 2018-07-19 VITALS — BP 113/71 | HR 71 | Temp 98.5°F | Resp 16 | Wt 172.0 lb

## 2018-07-19 DIAGNOSIS — N920 Excessive and frequent menstruation with regular cycle: Secondary | ICD-10-CM

## 2018-07-19 DIAGNOSIS — D500 Iron deficiency anemia secondary to blood loss (chronic): Secondary | ICD-10-CM | POA: Insufficient documentation

## 2018-07-19 LAB — CBC WITH DIFFERENTIAL (CANCER CENTER ONLY)
BASOS ABS: 0 10*3/uL (ref 0.0–0.1)
BASOS PCT: 0 %
EOS ABS: 0.2 10*3/uL (ref 0.0–0.5)
EOS PCT: 3 %
HCT: 39.4 % (ref 34.8–46.6)
Hemoglobin: 13.1 g/dL (ref 11.6–15.9)
LYMPHS PCT: 32 %
Lymphs Abs: 2.8 10*3/uL (ref 0.9–3.3)
MCH: 29.2 pg (ref 26.0–34.0)
MCHC: 33.2 g/dL (ref 32.0–36.0)
MCV: 87.8 fL (ref 81.0–101.0)
Monocytes Absolute: 0.8 10*3/uL (ref 0.1–0.9)
Monocytes Relative: 9 %
Neutro Abs: 4.9 10*3/uL (ref 1.5–6.5)
Neutrophils Relative %: 56 %
PLATELETS: 305 10*3/uL (ref 145–400)
RBC: 4.49 MIL/uL (ref 3.70–5.32)
RDW: 14.2 % (ref 11.1–15.7)
WBC: 8.7 10*3/uL (ref 3.9–10.0)

## 2018-07-19 LAB — IRON AND TIBC
Iron: 45 ug/dL (ref 41–142)
SATURATION RATIOS: 16 % — AB (ref 21–57)
TIBC: 289 ug/dL (ref 236–444)
UIBC: 244 ug/dL

## 2018-07-19 LAB — FERRITIN: Ferritin: 222 ng/mL (ref 11–307)

## 2018-07-19 NOTE — Progress Notes (Signed)
Hematology and Oncology Follow Up Visit  [name] [id number] [birth year] 32 y.o. 07/19/2018   Principle Diagnosis:  Iron deficiency anemia secondary to chronic blood lossdue to menorrhagia  Current Therapy:   IV iron as indicated - last received in January 2018   Interim History: [name] is here today for follow-up. She is feeling fatigued and has noted chills and palpitations as well. She has a referral to cardiology for the palpitations but has not scheduled with them yet.  Her cycle continues to be regular and quite heavy. No other bleeding, no bruising or petechiae.  Her Hgb is now 13.1 with an MCV of 87. She received to IV iron infusion in July.  No fever, chills, n/v, cough, rash, dizziness, headache, blurred vision, SOB, chest pain, palpitations, abdominal pain or changes in bowel or bladder habits.  No swelling, tenderness, numbness or tingling in her extremities.  No lymphadenopathy noted on exam.  She has maintained a good appetite and is staying well hydrated. Her weight is stable.   ECOG Performance Status: 1 - Symptomatic but completely ambulatory  Medications:  Allergies as of 07/19/2018   No Known Allergies     Medication List        Accurate as of 07/19/18  9:14 AM. Always use your most recent med list.          CITRANATAL BLOOM 90-1 MG Tabs Take 1 tablet by mouth daily before breakfast.   etonogestrel-ethinyl estradiol 0.12-0.015 MG/24HR vaginal ring Commonly known as:  NUVARING Insert vaginally and leave in place for 3 consecutive weeks, then remove for 1 week.   fluticasone 50 MCG/ACT nasal spray Commonly known as:  FLONASE Place 2 sprays into both nostrils daily.   guaiFENesin 600 MG 12 hr tablet Commonly known as:  MUCINEX Take 1 tablet (600 mg total) by mouth 2 (two) times daily as needed for cough or to loosen phlegm.   Olopatadine HCl 0.2 % Soln Apply 1 drop to eye 2 (two) times daily.   oxymetazoline 0.05 % nasal spray Commonly  known as:  AFRIN Place 1 spray into both nostrils 2 (two) times daily. Use only for 3days, then stop   promethazine 12.5 MG tablet Commonly known as:  PHENERGAN Take 1 tablet (12.5 mg total) by mouth every 8 (eight) hours as needed for nausea or vomiting.   Promethazine-Codeine 6.25-10 MG/5ML Soln Take 5ml by mouth every 6 hours as needed for cough   sennosides-docusate sodium 8.6-50 MG tablet Commonly known as:  SENOKOT-S Take 2 tablets by mouth daily.   triamcinolone cream 0.5 % Commonly known as:  KENALOG APPLY TO AFFECTED AREA TOPICALLY 4 TIMES A DAY   Vitamin D (Ergocalciferol) 50000 units Caps capsule Commonly known as:  DRISDOL Take 1 capsule weekly for 8 weeks, then 1.capsule every 2 weeks       Allergies: No Known Allergies  Past Medical History, Surgical history, Social history, and Family History were reviewed and updated.  Review of Systems: All other 10 point review of systems is negative.   Physical Exam:  vitals were not taken for this visit.   Wt Readings from Last 3 Encounters:  05/17/18 174 lb (78.9 kg)  05/04/18 174 lb (78.9 kg)  02/22/18 165 lb (74.8 kg)    Ocular: Sclerae unicteric, pupils equal, round and reactive to light Ear-nose-throat: Oropharynx clear, dentition fair Lymphatic: No cervical, supraclavicular or axillary adenopathy Lungs no rales or rhonchi, good excursion bilaterally Heart regular rate and rhythm, no murmur appreciated  Abd soft, nontender, positive bowel sounds, no liver or spleen tip palpated on exam, no fluid wave  MSK no focal spinal tenderness, no joint edema Neuro: non-focal, well-oriented, appropriate affect Breasts: Deferred   Lab Results  Component Value Date   WBC 8.7 07/19/2018   HGB 13.1 07/19/2018   HCT 39.4 07/19/2018   MCV 87.8 07/19/2018   PLT 305 07/19/2018   Lab Results  Component Value Date   FERRITIN 8 (L) 05/04/2018   IRON 21 (L) 05/04/2018   TIBC 378 05/04/2018   UIBC 225 10/28/2016    IRONPCTSAT 6 (L) 05/04/2018   Lab Results  Component Value Date   RBC 4.49 07/19/2018   No results found for: KPAFRELGTCHN, LAMBDASER, [facility] Lab Results  Component Value Date   IGA 310 11/16/2009   No results found for: Dorene Ar, A1GS, A2GS, Karn Pickler, SPEI   Chemistry      Component Value Date/Time   NA 139 05/04/2018 1520   K 3.8 05/04/2018 1520   CL 105 05/04/2018 1520   CO2 28 05/04/2018 1520   BUN 13 05/04/2018 1520   CREATININE 0.70 05/04/2018 1520      Component Value Date/Time   CALCIUM 8.9 05/04/2018 1520   ALKPHOS 60 05/04/2018 1520   AST 12 05/04/2018 1520   ALT 9 05/04/2018 1520   BILITOT 0.3 05/04/2018 1520      Impression and Plan: [name] is a very pleasant 32 yo African American female with iron deficiency anemia secondary to menorrhagia. Her Hgb has improved to 13.1. She is still symptomatic as mentioned above.  We will see what her iron studies show and bring her back in for infusion if needed.  She states that's he will call her cardiology and schedule a new patient appointment.  We will plan to see her back in another 2 months for follow-up.  She will contact our office with any questions or concerns. We can certainly see her sooner if need be.   Emeline Gins, NP 9/12/20199:14 AM

## 2018-08-02 ENCOUNTER — Inpatient Hospital Stay: Payer: BLUE CROSS/BLUE SHIELD

## 2018-08-02 VITALS — BP 119/63 | HR 65 | Temp 98.6°F | Resp 16

## 2018-08-02 DIAGNOSIS — D500 Iron deficiency anemia secondary to blood loss (chronic): Secondary | ICD-10-CM

## 2018-08-02 DIAGNOSIS — N920 Excessive and frequent menstruation with regular cycle: Secondary | ICD-10-CM | POA: Diagnosis not present

## 2018-08-02 MED ORDER — SODIUM CHLORIDE 0.9 % IV SOLN
INTRAVENOUS | Status: DC
  Administered 2018-08-02: 10:00:00 via INTRAVENOUS
  Filled 2018-08-02: qty 250

## 2018-08-02 MED ORDER — SODIUM CHLORIDE 0.9 % IV SOLN
510.0000 mg | Freq: Once | INTRAVENOUS | Status: AC
  Administered 2018-08-02: 510 mg via INTRAVENOUS
  Filled 2018-08-02: qty 17

## 2018-09-14 ENCOUNTER — Ambulatory Visit: Payer: BLUE CROSS/BLUE SHIELD | Admitting: Internal Medicine

## 2018-09-18 ENCOUNTER — Ambulatory Visit: Payer: BLUE CROSS/BLUE SHIELD | Admitting: Family

## 2018-09-18 ENCOUNTER — Other Ambulatory Visit: Payer: BLUE CROSS/BLUE SHIELD

## 2018-09-20 ENCOUNTER — Encounter: Payer: Self-pay | Admitting: Family

## 2018-09-20 ENCOUNTER — Other Ambulatory Visit: Payer: Self-pay

## 2018-09-20 ENCOUNTER — Inpatient Hospital Stay: Payer: BLUE CROSS/BLUE SHIELD | Attending: Hematology & Oncology

## 2018-09-20 ENCOUNTER — Inpatient Hospital Stay (HOSPITAL_BASED_OUTPATIENT_CLINIC_OR_DEPARTMENT_OTHER): Payer: BLUE CROSS/BLUE SHIELD | Admitting: Family

## 2018-09-20 VITALS — BP 115/72 | HR 78 | Temp 98.4°F | Resp 17 | Wt 176.4 lb

## 2018-09-20 DIAGNOSIS — Z79899 Other long term (current) drug therapy: Secondary | ICD-10-CM

## 2018-09-20 DIAGNOSIS — D500 Iron deficiency anemia secondary to blood loss (chronic): Secondary | ICD-10-CM

## 2018-09-20 DIAGNOSIS — N920 Excessive and frequent menstruation with regular cycle: Secondary | ICD-10-CM | POA: Insufficient documentation

## 2018-09-20 LAB — CBC WITH DIFFERENTIAL (CANCER CENTER ONLY)
Abs Immature Granulocytes: 0.03 10*3/uL (ref 0.00–0.07)
Basophils Absolute: 0.1 10*3/uL (ref 0.0–0.1)
Basophils Relative: 1 %
EOS ABS: 0.2 10*3/uL (ref 0.0–0.5)
EOS PCT: 3 %
HEMATOCRIT: 39.9 % (ref 36.0–46.0)
Hemoglobin: 12.9 g/dL (ref 12.0–15.0)
IMMATURE GRANULOCYTES: 0 %
LYMPHS ABS: 3 10*3/uL (ref 0.7–4.0)
Lymphocytes Relative: 39 %
MCH: 29.7 pg (ref 26.0–34.0)
MCHC: 32.3 g/dL (ref 30.0–36.0)
MCV: 91.7 fL (ref 80.0–100.0)
MONO ABS: 0.6 10*3/uL (ref 0.1–1.0)
MONOS PCT: 8 %
Neutro Abs: 3.7 10*3/uL (ref 1.7–7.7)
Neutrophils Relative %: 49 %
Platelet Count: 292 10*3/uL (ref 150–400)
RBC: 4.35 MIL/uL (ref 3.87–5.11)
RDW: 12.2 % (ref 11.5–15.5)
WBC Count: 7.6 10*3/uL (ref 4.0–10.5)
nRBC: 0 % (ref 0.0–0.2)

## 2018-09-20 LAB — IRON AND TIBC
Iron: 75 ug/dL (ref 41–142)
Saturation Ratios: 26 % (ref 21–57)
TIBC: 290 ug/dL (ref 236–444)
UIBC: 215 ug/dL (ref 120–384)

## 2018-09-20 LAB — FERRITIN: Ferritin: 277 ng/mL (ref 11–307)

## 2018-09-20 NOTE — Progress Notes (Signed)
Hematology and Oncology Follow Up Visit  [name] [id number] 1986-08-06 32 y.o. 09/20/2018   Principle Diagnosis:  Iron deficiency anemia secondary to chronic blood lossdue to menorrhagia  Current Therapy:   IV iron as indicated    Interim History:  [name] is here today for follow-up. She is doing well overall but still has some mild fatigue at times.  Her cycle is regular but heavy. No other bleeding, no bruising or petechiae.  No fever, chills, n/v, cough, rash, dizziness, SOB, chest pain, palpitations, abdominal pain or changes in bowel or bladder habits.  No swelling, tenderness, numbness or tingling in her extremities. No c/o pain.  No lymphadenopathy noted on exam.  She is eating well and staying hydrated. Her weight is stable.   ECOG Performance Status: 1 - Symptomatic but completely ambulatory  Medications:  Allergies as of 09/20/2018   No Known Allergies     Medication List        Accurate as of 09/20/18 10:11 AM. Always use your most recent med list.          etonogestrel-ethinyl estradiol 0.12-0.015 MG/24HR vaginal ring Commonly known as:  NUVARING Insert vaginally and leave in place for 3 consecutive weeks, then remove for 1 week.   fluticasone 50 MCG/ACT nasal spray Commonly known as:  FLONASE Place 2 sprays into both nostrils daily.   guaiFENesin 600 MG 12 hr tablet Commonly known as:  MUCINEX Take 1 tablet (600 mg total) by mouth 2 (two) times daily as needed for cough or to loosen phlegm.   Olopatadine HCl 0.2 % Soln Apply 1 drop to eye 2 (two) times daily.   oxymetazoline 0.05 % nasal spray Commonly known as:  AFRIN Place 1 spray into both nostrils 2 (two) times daily. Use only for 3days, then stop   promethazine 12.5 MG tablet Commonly known as:  PHENERGAN Take 1 tablet (12.5 mg total) by mouth every 8 (eight) hours as needed for nausea or vomiting.   Promethazine-Codeine 6.25-10 MG/5ML Soln Take 5ml by mouth every 6 hours as  needed for cough   sennosides-docusate sodium 8.6-50 MG tablet Commonly known as:  SENOKOT-S Take 2 tablets by mouth daily.   triamcinolone cream 0.5 % Commonly known as:  KENALOG APPLY TO AFFECTED AREA TOPICALLY 4 TIMES A DAY   Vitamin D (Ergocalciferol) 1.25 MG (50000 UT) Caps capsule Commonly known as:  DRISDOL Take 1 capsule weekly for 8 weeks, then 1.capsule every 2 weeks       Allergies: No Known Allergies  Past Medical History, Surgical history, Social history, and Family History were reviewed and updated.  Review of Systems: All other 10 point review of systems is negative.   Physical Exam:  weight is 176 lb 6.4 oz (80 kg). Her oral temperature is 98.4 F (36.9 C). Her blood pressure is 115/72 and her pulse is 78. Her respiration is 17 and oxygen saturation is 100%.   Wt Readings from Last 3 Encounters:  09/20/18 176 lb 6.4 oz (80 kg)  07/19/18 172 lb (78 kg)  05/17/18 174 lb (78.9 kg)    Ocular: Sclerae unicteric, pupils equal, round and reactive to light Ear-nose-throat: Oropharynx clear, dentition fair Lymphatic: No cervical, supraclavicular or axillary adenopathy Lungs no rales or rhonchi, good excursion bilaterally Heart regular rate and rhythm, no murmur appreciated Abd soft, nontender, positive bowel sounds, no liver or spleen tip palpated on exam, no fluid wave  MSK no focal spinal tenderness, no joint edema Neuro: non-focal, well-oriented,  appropriate affect Breasts: Deferred   Lab Results  Component Value Date   WBC 7.6 09/20/2018   HGB 12.9 09/20/2018   HCT 39.9 09/20/2018   MCV 91.7 09/20/2018   PLT 292 09/20/2018   Lab Results  Component Value Date   FERRITIN 222 07/19/2018   IRON 45 07/19/2018   TIBC 289 07/19/2018   UIBC 244 07/19/2018   IRONPCTSAT 16 (L) 07/19/2018   Lab Results  Component Value Date   RBC 4.35 09/20/2018   No results found for: KPAFRELGTCHN, LAMBDASER, KAPLAMBRATIO Lab Results  Component Value Date   IGA 310  11/16/2009   No results found for: Dorene Ar, A1GS, Emmit Alexanders, SPEI   Chemistry      Component Value Date/Time   NA 139 05/04/2018 1520   K 3.8 05/04/2018 1520   CL 105 05/04/2018 1520   CO2 28 05/04/2018 1520   BUN 13 05/04/2018 1520   CREATININE 0.70 05/04/2018 1520      Component Value Date/Time   CALCIUM 8.9 05/04/2018 1520   ALKPHOS 60 05/04/2018 1520   AST 12 05/04/2018 1520   ALT 9 05/04/2018 1520   BILITOT 0.3 05/04/2018 1520       Impression and Plan: [name] is a very pleasant 32 yo African American female with iron deficiency anemia secondary to heavy cycles.  We will see what her iron studies show and bring her back in for infusion if needed.  We will see her in another 3 months.  She will contact our office with any questions or concerns. We can certainly see her sooner if need be.   Emeline Gins, NP 11/14/201910:11 AM

## 2018-11-12 ENCOUNTER — Other Ambulatory Visit: Payer: Self-pay

## 2018-11-13 NOTE — Telephone Encounter (Signed)
Last filled in 2017, please advise

## 2018-11-18 ENCOUNTER — Other Ambulatory Visit: Payer: Self-pay | Admitting: Internal Medicine

## 2018-11-18 MED ORDER — ONDANSETRON HCL 4 MG PO TABS: 4.0000 mg | ORAL_TABLET | Freq: Three times a day (TID) | ORAL | 0 refills | Status: DC | PRN

## 2018-11-19 MED ORDER — PROMETHAZINE HCL 12.5 MG PO TABS: 12.5000 mg | ORAL_TABLET | Freq: Three times a day (TID) | ORAL | 0 refills | Status: DC | PRN

## 2018-12-19 ENCOUNTER — Ambulatory Visit: Payer: BLUE CROSS/BLUE SHIELD | Admitting: Internal Medicine

## 2018-12-21 ENCOUNTER — Other Ambulatory Visit: Payer: BLUE CROSS/BLUE SHIELD

## 2018-12-21 ENCOUNTER — Ambulatory Visit: Payer: BLUE CROSS/BLUE SHIELD | Admitting: Family

## 2019-01-06 ENCOUNTER — Other Ambulatory Visit: Payer: Self-pay

## 2019-01-06 ENCOUNTER — Encounter (HOSPITAL_COMMUNITY): Payer: Self-pay | Admitting: Emergency Medicine

## 2019-01-06 DIAGNOSIS — Z23 Encounter for immunization: Secondary | ICD-10-CM | POA: Insufficient documentation

## 2019-01-06 DIAGNOSIS — Z79899 Other long term (current) drug therapy: Secondary | ICD-10-CM | POA: Insufficient documentation

## 2019-01-06 DIAGNOSIS — R079 Chest pain, unspecified: Secondary | ICD-10-CM | POA: Diagnosis not present

## 2019-01-06 DIAGNOSIS — R0789 Other chest pain: Secondary | ICD-10-CM | POA: Insufficient documentation

## 2019-01-06 DIAGNOSIS — R0602 Shortness of breath: Secondary | ICD-10-CM | POA: Diagnosis not present
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
## 2019-01-07 ENCOUNTER — Emergency Department (HOSPITAL_COMMUNITY)
Admission: EM | Admit: 2019-01-07 | Discharge: 2019-01-07 | Disposition: A | Discharge status: Home / Self Care | Payer: BLUE CROSS/BLUE SHIELD | Attending: Emergency Medicine | Admitting: Emergency Medicine

## 2019-01-07 ENCOUNTER — Other Ambulatory Visit: Payer: Self-pay

## 2019-01-07 ENCOUNTER — Emergency Department (HOSPITAL_COMMUNITY): Payer: BLUE CROSS/BLUE SHIELD

## 2019-01-07 DIAGNOSIS — R0789 Other chest pain: Secondary | ICD-10-CM

## 2019-01-07 DIAGNOSIS — R0602 Shortness of breath: Secondary | ICD-10-CM | POA: Diagnosis not present

## 2019-01-07 DIAGNOSIS — R079 Chest pain, unspecified: Secondary | ICD-10-CM | POA: Diagnosis not present

## 2019-01-07 LAB — I-STAT BETA HCG BLOOD, ED (MC, WL, AP ONLY): I-stat hCG, quantitative: <5 m[IU]/mL (ref <5)

## 2019-01-07 MED ORDER — TETANUS-DIPHTH-ACELL PERTUSSIS 5-2.5-18.5 LF-MCG/0.5 IM SUSP
0.5000 mL | Freq: Once | INTRAMUSCULAR | Status: AC
  Administered 2019-01-07: 0.5 mL via INTRAMUSCULAR
  Filled 2019-01-07: qty 0.5

## 2019-01-07 NOTE — ED Notes (Signed)
Pt discharged from ED; instructions provided; Pt encouraged to return to ED if symptoms worsen and to f/u with PCP; Pt verbalized understanding of all instructions 

## 2019-01-07 NOTE — Discharge Instructions (Signed)
Take 4 over the counter ibuprofen tablets 3 times a day or 2 over-the-counter naproxen tablets twice a day for pain. Also take tylenol 1000mg(2 extra strength) four times a day.    
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
## 2019-01-22 ENCOUNTER — Ambulatory Visit: Payer: BLUE CROSS/BLUE SHIELD | Admitting: Internal Medicine

## 2019-05-14 ENCOUNTER — Ambulatory Visit (INDEPENDENT_AMBULATORY_CARE_PROVIDER_SITE_OTHER): Payer: BC Managed Care – PPO | Admitting: Internal Medicine

## 2019-05-14 ENCOUNTER — Telehealth: Payer: Self-pay

## 2019-05-14 ENCOUNTER — Encounter: Payer: Self-pay | Admitting: Internal Medicine

## 2019-05-14 ENCOUNTER — Telehealth: Payer: Self-pay | Admitting: General Practice

## 2019-05-14 DIAGNOSIS — Z20822 Contact with and (suspected) exposure to covid-19: Secondary | ICD-10-CM

## 2019-05-14 DIAGNOSIS — R6889 Other general symptoms and signs: Secondary | ICD-10-CM

## 2019-05-14 DIAGNOSIS — Z20828 Contact with and (suspected) exposure to other viral communicable diseases: Secondary | ICD-10-CM

## 2019-05-14 DIAGNOSIS — J0140 Acute pansinusitis, unspecified: Secondary | ICD-10-CM

## 2019-05-14 MED ORDER — OLOPATADINE HCL 0.2 % OP SOLN: 1.0000 [drp] | Freq: Two times a day (BID) | OPHTHALMIC | 0 refills | Status: DC

## 2019-05-14 NOTE — Addendum Note (Signed)
Addended by: Dimple Nanas on: 05/14/2019 04:58 PM   Modules accepted: Orders

## 2019-05-14 NOTE — Assessment & Plan Note (Signed)
Symptoms sound very convincing for covid-19 infection starting last week. Needs covid-19 testing and needs to quarantine until results return. Did counsel if she ever has these symptoms again not to work and to seek care by calling PCP.

## 2019-05-14 NOTE — Progress Notes (Signed)
Virtual Visit via Video Note  I connected with [name] on 05/14/19 at 10:20 AM EDT by a video enabled telemedicine application and verified that I am speaking with the correct person using two identifiers.  The patient and the provider were at separate locations throughout the entire encounter.   I discussed the limitations of evaluation and management by telemedicine and the availability of in person appointments. The patient expressed understanding and agreed to proceed.  History of Present Illness: The patient is a 33 y.o. female with visit for fatigue and sore throat. Started 1 week ago. Has diarrhea some last week as well as muscle aches and headaches Some sore throat and drainage. Denies fevers or chills. Overall it is improving gradually. Is still having cough, denies SOB. She has been working throughout all of this. Is home today and does not work again until Friday. Has tried cetirizine which has helped the drainage some she thinks. Sore throat still.   Observations/Objective: Appearance: normal, breathing appears normal, casual grooming, abdomen does not appear distended, throat drainage, mental status is A and O times 3  Assessment and Plan: See problem oriented charting  Follow Up Instructions: needs covid-19 testing and advised of the urgent need to quarantine until results return.   I discussed the assessment and treatment plan with the patient. The patient was provided an opportunity to ask questions and all were answered. The patient agreed with the plan and demonstrated an understanding of the instructions.   The patient was advised to call back or seek an in-person evaluation if the symptoms worsen or if the condition fails to improve as anticipated.  Hoyt Koch, MD

## 2019-05-14 NOTE — Telephone Encounter (Signed)
Pt has been scheduled for covid testing.  ° °

## 2019-05-14 NOTE — Telephone Encounter (Signed)
Received call from pt to schedule covid testing. Showing in chart that provider requested covid testing.   Assisted pt with scheduling.  Scheduled pt for [place] [facility].   Pt was referred by: Dr. Sharlet Salina

## 2019-05-14 NOTE — Telephone Encounter (Signed)
Patient is experiencing sore throat, sinus issues, cough, diarrhea and Dr. Sharlet Salina would like patient to be tested thank you

## 2019-05-15 ENCOUNTER — Other Ambulatory Visit: Payer: BC Managed Care – PPO

## 2019-05-15 ENCOUNTER — Ambulatory Visit: Payer: BLUE CROSS/BLUE SHIELD | Admitting: Internal Medicine

## 2019-05-15 DIAGNOSIS — R6889 Other general symptoms and signs: Secondary | ICD-10-CM | POA: Diagnosis not present

## 2019-05-15 DIAGNOSIS — Z20822 Contact with and (suspected) exposure to covid-19: Secondary | ICD-10-CM

## 2019-05-21 LAB — NOVEL CORONAVIRUS, NAA: SARS-CoV-2, NAA: NOT DETECTED

## 2019-07-26 ENCOUNTER — Other Ambulatory Visit: Payer: Self-pay

## 2019-07-26 DIAGNOSIS — Z20822 Contact with and (suspected) exposure to covid-19: Secondary | ICD-10-CM

## 2019-07-26 DIAGNOSIS — R6889 Other general symptoms and signs: Secondary | ICD-10-CM | POA: Diagnosis not present

## 2019-07-27 LAB — NOVEL CORONAVIRUS, NAA: SARS-CoV-2, NAA: NOT DETECTED

## 2019-09-09 ENCOUNTER — Ambulatory Visit (INDEPENDENT_AMBULATORY_CARE_PROVIDER_SITE_OTHER): Payer: BC Managed Care – PPO | Admitting: Obstetrics

## 2019-09-09 ENCOUNTER — Other Ambulatory Visit: Payer: Self-pay

## 2019-09-09 ENCOUNTER — Encounter: Payer: Self-pay | Admitting: Obstetrics

## 2019-09-09 VITALS — BP 109/73 | HR 91 | Ht 60.0 in | Wt 183.4 lb

## 2019-09-09 DIAGNOSIS — Z3009 Encounter for other general counseling and advice on contraception: Secondary | ICD-10-CM

## 2019-09-09 DIAGNOSIS — Z01419 Encounter for gynecological examination (general) (routine) without abnormal findings: Secondary | ICD-10-CM | POA: Diagnosis not present

## 2019-09-09 DIAGNOSIS — Z1151 Encounter for screening for human papillomavirus (HPV): Secondary | ICD-10-CM | POA: Diagnosis not present

## 2019-09-09 DIAGNOSIS — D508 Other iron deficiency anemias: Secondary | ICD-10-CM

## 2019-09-09 DIAGNOSIS — Z124 Encounter for screening for malignant neoplasm of cervix: Secondary | ICD-10-CM | POA: Diagnosis not present

## 2019-09-09 DIAGNOSIS — Z113 Encounter for screening for infections with a predominantly sexual mode of transmission: Secondary | ICD-10-CM

## 2019-09-09 DIAGNOSIS — Z3044 Encounter for surveillance of vaginal ring hormonal contraceptive device: Secondary | ICD-10-CM

## 2019-09-09 DIAGNOSIS — N898 Other specified noninflammatory disorders of vagina: Secondary | ICD-10-CM | POA: Diagnosis not present

## 2019-09-09 MED ORDER — ETONOGESTREL-ETHINYL ESTRADIOL 0.12-0.015 MG/24HR VA RING: VAGINAL_RING | VAGINAL | 12 refills | Status: DC

## 2019-09-09 MED ORDER — PRENATAL PLUS 27-1 MG PO TABS: 1.0000 | ORAL_TABLET | Freq: Every day | ORAL | 11 refills | Status: DC

## 2019-09-09 NOTE — Progress Notes (Signed)
Subjective:        [name] is a 33 y.o. female here for a routine exam.  Current complaints: None.    Personal health questionnaire:  Is patient Ashkenazi Jewish, have a family history of breast and/or ovarian cancer: no Is there a family history of uterine cancer diagnosed at age < 5550, gastrointestinal cancer, urinary tract cancer, family member who is a Personnel officerLynch syndrome-associated carrier: no Is the patient overweight and hypertensive, family history of diabetes, personal history of gestational diabetes, preeclampsia or PCOS: no Is patient over 5755, have PCOS,  family history of premature CHD under age 32, diabetes, smoke, have hypertension or peripheral artery disease:  no At any time, has a partner hit, kicked or otherwise hurt or frightened you?: no Over the past 2 weeks, have you felt down, depressed or hopeless?: no Over the past 2 weeks, have you felt little interest or pleasure in doing things?:no   Gynecologic History Patient's last menstrual period was 08/11/2019. Contraception: none Last Pap: 2017. Results were: normal Last mammogram: n/a. Results were: n/a  Obstetric History OB History  Gravida Para Term Preterm AB Living  3 1 1   2 1   SAB TAB Ectopic Multiple Live Births  1 1     1     # Outcome Date GA Lbr Len/2nd Weight Sex Delivery Anes PTL Lv  3 Term 03/15/06     CS-LTranv   LIV  2 TAB           1 SAB             Past Medical History:  Diagnosis Date  . Anemia    no current med.  . Fracture of radius, distal, right, closed 03/20/2015  . GERD (gastroesophageal reflux disease)    OTC as needed  . History of cardiac murmur    states workup 2013 - no problems identified  . Radius fracture 03/16/2015   right    Past Surgical History:  Procedure Laterality Date  . CESAREAN SECTION  03/15/2006  . OPEN REDUCTION INTERNAL FIXATION (ORIF) DISTAL RADIAL FRACTURE Right 03/20/2015   Procedure: OPEN REDUCTION INTERNAL FIXATION RIGHT DISTAL RADIUS;  Surgeon:  Teryl LucyJoshua Landau, MD;  Location: [facility];  Service: Orthopedics;  Laterality: Right;     Current Outpatient Medications:  .  ferrous sulfate 325 (65 FE) MG tablet, Take 325 mg by mouth daily with breakfast., Disp: , Rfl:  .  fluticasone (FLONASE) 50 MCG/ACT nasal spray, Place 2 sprays into both nostrils daily., Disp: 16 g, Rfl: 2 .  Olopatadine HCl 0.2 % SOLN, Apply 1 drop to eye 2 (two) times daily., Disp: 2.5 mL, Rfl: 0 .  ondansetron (ZOFRAN) 4 MG tablet, Take 1 tablet (4 mg total) by mouth every 8 (eight) hours as needed for nausea or vomiting., Disp: 12 tablet, Rfl: 0 .  oxymetazoline (AFRIN NASAL SPRAY) 0.05 % nasal spray, Place 1 spray into both nostrils 2 (two) times daily. Use only for 3days, then stop, Disp: 30 mL, Rfl: 0 .  promethazine (PHENERGAN) 12.5 MG tablet, Take 1 tablet (12.5 mg total) by mouth every 8 (eight) hours as needed for nausea or vomiting., Disp: 20 tablet, Rfl: 0 .  Vitamin D, Ergocalciferol, (DRISDOL) 50000 units CAPS capsule, Take 1 capsule weekly for 8 weeks, then 1.capsule every 2 weeks, Disp: 8 capsule, Rfl: 6 .  etonogestrel-ethinyl estradiol (NUVARING) 0.12-0.015 MG/24HR vaginal ring, Insert vaginally and leave in place for 3 consecutive weeks, then remove for  1 week., Disp: 1 each, Rfl: 12 .  guaiFENesin (MUCINEX) 600 MG 12 hr tablet, Take 1 tablet (600 mg total) by mouth 2 (two) times daily as needed for cough or to loosen phlegm. (Patient not taking: Reported on 01/07/2019), Disp: 14 tablet, Rfl: 0 .  prenatal vitamin w/FE, FA (PRENATAL 1 + 1) 27-1 MG TABS tablet, Take 1 tablet by mouth daily before breakfast., Disp: 30 tablet, Rfl: 11 .  Promethazine-Codeine 6.25-10 MG/5ML SOLN, Take 54ml by mouth every 6 hours as needed for cough (Patient not taking: Reported on 01/07/2019), Disp: 120 mL, Rfl: 0 .  sennosides-docusate sodium (SENOKOT-S) 8.6-50 MG tablet, Take 2 tablets by mouth daily. (Patient not taking: Reported on 01/07/2019), Disp: 30 tablet, Rfl:  1 .  triamcinolone cream (KENALOG) 0.5 %, APPLY TO AFFECTED AREA TOPICALLY 4 TIMES A DAY (Patient not taking: Reported on 01/07/2019), Disp: 30 g, Rfl: 0 No Known Allergies  Social History   Tobacco Use  . Smoking status: Never Smoker  . Smokeless tobacco: Never Used  Substance Use Topics  . Alcohol use: Yes    Comment: occasionally    History reviewed. No pertinent family history.    Review of Systems  Constitutional: negative for fatigue and weight loss Respiratory: negative for cough and wheezing Cardiovascular: negative for chest pain, fatigue and palpitations Gastrointestinal: negative for abdominal pain and change in bowel habits Musculoskeletal:negative for myalgias Neurological: negative for gait problems and tremors Behavioral/Psych: negative for abusive relationship, depression Endocrine: negative for temperature intolerance    Genitourinary:negative for abnormal menstrual periods, genital lesions, hot flashes, sexual problems and vaginal discharge Integument/breast: negative for breast lump, breast tenderness, nipple discharge and skin lesion(s)    Objective:       BP 109/73   Pulse 91   Ht 5' (1.524 m)   Wt 183 lb 6.4 oz (83.2 kg)   LMP 08/11/2019   BMI 35.82 kg/m  General:   alert  Skin:   no rash or abnormalities  Lungs:   clear to auscultation bilaterally  Heart:   regular rate and rhythm, S1, S2 normal, no murmur, click, rub or gallop  Breasts:   normal without suspicious masses, skin or nipple changes or axillary nodes  Abdomen:  normal findings: no organomegaly, soft, non-tender and no hernia  Pelvis:  External genitalia: normal general appearance Urinary system: urethral meatus normal and bladder without fullness, nontender Vaginal: normal without tenderness, induration or masses Cervix: normal appearance Adnexa: normal bimanual exam Uterus: anteverted and non-tender, normal size   Lab Review Urine pregnancy test Labs reviewed yes Radiologic  studies reviewed no  50% of 25 min visit spent on counseling and coordination of care.   Assessment:     1. Encounter for routine gynecological examination with Papanicolaou smear of cervix Rx: - Cytology - PAP( [facility])  2. Vaginal discharge Rx: - Cervicovaginal ancillary only( [facility])  3. Screen for STD (sexually transmitted disease) Rx: - Hepatitis B surface antigen - Hepatitis C antibody - HIV Antibody (routine testing w rflx) - RPR  4. General counseling and advice on female contraception - wants to resume NuvaRing  5. Iron deficiency anemia secondary to inadequate dietary iron intake Rx - CBC - prenatal vitamin w/FE, FA (PRENATAL 1 + 1) 27-1 MG TABS tablet; Take 1 tablet by mouth daily before breakfast.  Dispense: 30 tablet; Refill: 11  6. Encounter for surveillance of vaginal ring hormonal contraceptive device Rx: - etonogestrel-ethinyl estradiol (NUVARING) 0.12-0.015 MG/24HR vaginal ring; Insert vaginally and  leave in place for 3 consecutive weeks, then remove for 1 week.  Dispense: 1 each; Refill: 12   Plan:    Education reviewed: calcium supplements, depression evaluation, low fat, low cholesterol diet, safe sex/STD prevention, self breast exams and weight bearing exercise. Contraception: NuvaRing vaginal inserts. Follow up in: 1 year.   Meds ordered this encounter  Medications  . prenatal vitamin w/FE, FA (PRENATAL 1 + 1) 27-1 MG TABS tablet    Sig: Take 1 tablet by mouth daily before breakfast.    Dispense:  30 tablet    Refill:  11  . etonogestrel-ethinyl estradiol (NUVARING) 0.12-0.015 MG/24HR vaginal ring    Sig: Insert vaginally and leave in place for 3 consecutive weeks, then remove for 1 week.    Dispense:  1 each    Refill:  12   Orders Placed This Encounter  Procedures  . Hepatitis B surface antigen  . Hepatitis C antibody  . HIV Antibody (routine testing w rflx)  . RPR  . CBC    Shelly Bombard, MD 09/09/2019 11:24 AM

## 2019-09-09 NOTE — Progress Notes (Signed)
Pt presents for AEX. Pt would like to have iron levels checked today due feeling fatigue. She also request to have STD testing. Last pap 09/26/2016 Normal.

## 2019-09-10 LAB — CBC
Hematocrit: 37.1 % (ref 34.0–46.6)
Hemoglobin: 12.2 g/dL (ref 11.1–15.9)
MCH: 28.3 pg (ref 26.6–33.0)
MCHC: 32.9 g/dL (ref 31.5–35.7)
MCV: 86 fL (ref 79–97)
Platelets: 334 10*3/uL (ref 150–450)
RBC: 4.31 x10E6/uL (ref 3.77–5.28)
RDW: 12.6 % (ref 11.7–15.4)
WBC: 7.6 10*3/uL (ref 3.4–10.8)

## 2019-09-10 LAB — CERVICOVAGINAL ANCILLARY ONLY
Bacterial Vaginitis (gardnerella): NEGATIVE
Candida Glabrata: NEGATIVE
Candida Vaginitis: NEGATIVE
Chlamydia: NEGATIVE
Comment: NEGATIVE
Comment: NEGATIVE
Comment: NEGATIVE
Comment: NEGATIVE
Comment: NEGATIVE
Comment: NORMAL
Neisseria Gonorrhea: NEGATIVE
Trichomonas: NEGATIVE

## 2019-09-10 LAB — HIV ANTIBODY (ROUTINE TESTING W REFLEX): HIV Screen 4th Generation wRfx: NONREACTIVE

## 2019-09-10 LAB — HEPATITIS B SURFACE ANTIGEN: Hepatitis B Surface Ag: NEGATIVE

## 2019-09-10 LAB — HEPATITIS C ANTIBODY: Hep C Virus Ab: <0.1 s/co ratio (ref 0.0–0.9)

## 2019-09-10 LAB — RPR: RPR Ser Ql: NONREACTIVE

## 2019-09-12 LAB — CYTOLOGY - PAP
Comment: NEGATIVE
Diagnosis: NEGATIVE
High risk HPV: NEGATIVE

## 2020-02-04 ENCOUNTER — Telehealth: Payer: Self-pay

## 2020-02-04 NOTE — Telephone Encounter (Signed)
New message   The patient is asking for a referral to [facility] Cardiologist or which office Dr. Posey Rea recommends.    Reason: pain around the heart  Insurance is Medicaid   The patient would like to be seen soon with cardiologist  Asking for a callback regarding the referral.

## 2020-02-04 NOTE — Telephone Encounter (Signed)
Pt has not seen Dr Posey Rea since 04/2018, she will need OV to get referral.

## 2020-02-05 NOTE — Telephone Encounter (Signed)
F/u  The patient called back and informed of a message of an office visit is needed.   The patient decided she will call back once she looked at her schedule before setting up an appt to see Dr. Posey Rea.   No appt is made at this time.

## 2020-02-05 NOTE — Telephone Encounter (Signed)
F/u    Call patient left a voicemail per Dr. Posey Rea recommendation of an office visit is needed, advise patient to call the office back at her convenience to set up an appt.

## 2020-03-31 ENCOUNTER — Encounter: Payer: Self-pay | Admitting: Internal Medicine

## 2020-03-31 ENCOUNTER — Ambulatory Visit (INDEPENDENT_AMBULATORY_CARE_PROVIDER_SITE_OTHER): Payer: Self-pay | Admitting: Internal Medicine

## 2020-03-31 ENCOUNTER — Other Ambulatory Visit: Payer: Self-pay

## 2020-03-31 VITALS — BP 110/78 | HR 92 | Temp 99.7°F | Ht 60.0 in | Wt 192.0 lb

## 2020-03-31 DIAGNOSIS — L659 Nonscarring hair loss, unspecified: Secondary | ICD-10-CM

## 2020-03-31 DIAGNOSIS — E559 Vitamin D deficiency, unspecified: Secondary | ICD-10-CM

## 2020-03-31 DIAGNOSIS — D500 Iron deficiency anemia secondary to blood loss (chronic): Secondary | ICD-10-CM

## 2020-03-31 DIAGNOSIS — R21 Rash and other nonspecific skin eruption: Secondary | ICD-10-CM

## 2020-03-31 DIAGNOSIS — Z0000 Encounter for general adult medical examination without abnormal findings: Secondary | ICD-10-CM | POA: Insufficient documentation

## 2020-03-31 LAB — CBC WITH DIFFERENTIAL/PLATELET
Basophils Absolute: 0 10*3/uL (ref 0.0–0.1)
Basophils Relative: 0.4 % (ref 0.0–3.0)
Eosinophils Absolute: 0.4 10*3/uL (ref 0.0–0.7)
Eosinophils Relative: 3.9 % (ref 0.0–5.0)
HCT: 35.2 % — ABNORMAL LOW (ref 36.0–46.0)
Hemoglobin: 11.6 g/dL — ABNORMAL LOW (ref 12.0–15.0)
Lymphocytes Relative: 32.9 % (ref 12.0–46.0)
Lymphs Abs: 3.4 10*3/uL (ref 0.7–4.0)
MCHC: 33 g/dL (ref 30.0–36.0)
MCV: 82.4 fl (ref 78.0–100.0)
Monocytes Absolute: 0.7 10*3/uL (ref 0.1–1.0)
Monocytes Relative: 7.3 % (ref 3.0–12.0)
Neutro Abs: 5.7 10*3/uL (ref 1.4–7.7)
Neutrophils Relative %: 55.5 % (ref 43.0–77.0)
Platelets: 336 10*3/uL (ref 150.0–400.0)
RBC: 4.27 Mil/uL (ref 3.87–5.11)
RDW: 13.4 % (ref 11.5–15.5)
WBC: 10.2 10*3/uL (ref 4.0–10.5)

## 2020-03-31 LAB — VITAMIN B12: Vitamin B-12: 381 pg/mL (ref 211–911)

## 2020-03-31 LAB — HEPATIC FUNCTION PANEL
ALT: 11 U/L (ref 0–35)
AST: 15 U/L (ref 0–37)
Albumin: 4.1 g/dL (ref 3.5–5.2)
Alkaline Phosphatase: 64 U/L (ref 39–117)
Bilirubin, Direct: 0.1 mg/dL (ref 0.0–0.3)
Total Bilirubin: 0.4 mg/dL (ref 0.2–1.2)
Total Protein: 7.3 g/dL (ref 6.0–8.3)

## 2020-03-31 LAB — BASIC METABOLIC PANEL
BUN: 11 mg/dL (ref 6–23)
CO2: 27 mEq/L (ref 19–32)
Calcium: 9.5 mg/dL (ref 8.4–10.5)
Chloride: 106 mEq/L (ref 96–112)
Creatinine, Ser: 0.75 mg/dL (ref 0.40–1.20)
GFR: 107.86 mL/min (ref >60.00)
Glucose, Bld: 80 mg/dL (ref 70–99)
Potassium: 3.8 mEq/L (ref 3.5–5.1)
Sodium: 137 mEq/L (ref 135–145)

## 2020-03-31 LAB — LIPID PANEL
Cholesterol: 136 mg/dL (ref 0–200)
HDL: 46.7 mg/dL (ref >39.00)
LDL Cholesterol: 78 mg/dL (ref 0–99)
NonHDL: 89.69
Total CHOL/HDL Ratio: 3
Triglycerides: 58 mg/dL (ref 0.0–149.0)
VLDL: 11.6 mg/dL (ref 0.0–40.0)

## 2020-03-31 LAB — TSH: TSH: 1.81 u[IU]/mL (ref 0.35–4.50)

## 2020-03-31 LAB — VITAMIN D 25 HYDROXY (VIT D DEFICIENCY, FRACTURES): VITD: 16.75 ng/mL — ABNORMAL LOW (ref 30.00–100.00)

## 2020-03-31 MED ORDER — CLINDAMYCIN PHOSPHATE 1 % EX LOTN: TOPICAL_LOTION | Freq: Two times a day (BID) | CUTANEOUS | 3 refills | Status: DC

## 2020-03-31 NOTE — Assessment & Plan Note (Signed)
Labs

## 2020-03-31 NOTE — Assessment & Plan Note (Signed)
  We discussed age appropriate health related issues, including available/recomended screening tests and vaccinations. Labs were ordered to be later reviewed . All questions were answered. We discussed one or more of the following - seat belt use, use of sunscreen/sun exposure exercise, safe sex, fall risk reduction, second hand smoke exposure, firearm use and storage, seat belt use, a need for adhering to healthy diet and exercise. Labs were ordered .  All questions were answered. COVID19 vaccination advised.

## 2020-03-31 NOTE — Patient Instructions (Signed)

## 2020-03-31 NOTE — Assessment & Plan Note (Signed)
Re-start Vit D Labs Risks associated with treatment noncompliance were discussed. Compliance was encouraged.

## 2020-03-31 NOTE — Assessment & Plan Note (Signed)
Face acne vs mask rash Cleocin lotion

## 2020-03-31 NOTE — Progress Notes (Signed)
Subjective:  Patient ID: [name], female    DOB: 04-Jul-1986  Age: 34 y.o. MRN: [id number]  CC: No chief complaint on file.   HPI TISHAWNA LAROUCHE presents for a well exam C/o hair loss, wt gain C/o rash on face F/u Vit D def, anemia - not taking vitamins  Outpatient Medications Prior to Visit  Medication Sig Dispense Refill  . etonogestrel-ethinyl estradiol (NUVARING) 0.12-0.015 MG/24HR vaginal ring Insert vaginally and leave in place for 3 consecutive weeks, then remove for 1 week. 1 each 12  . fluticasone (FLONASE) 50 MCG/ACT nasal spray Place 2 sprays into both nostrils daily. 16 g 2  . guaiFENesin (MUCINEX) 600 MG 12 hr tablet Take 1 tablet (600 mg total) by mouth 2 (two) times daily as needed for cough or to loosen phlegm. 14 tablet 0  . Olopatadine HCl 0.2 % SOLN Apply 1 drop to eye 2 (two) times daily. 2.5 mL 0  . ondansetron (ZOFRAN) 4 MG tablet Take 1 tablet (4 mg total) by mouth every 8 (eight) hours as needed for nausea or vomiting. 12 tablet 0  . oxymetazoline (AFRIN NASAL SPRAY) 0.05 % nasal spray Place 1 spray into both nostrils 2 (two) times daily. Use only for 3days, then stop 30 mL 0  . ferrous sulfate 325 (65 FE) MG tablet Take 325 mg by mouth daily with breakfast.    . prenatal vitamin w/FE, FA (PRENATAL 1 + 1) 27-1 MG TABS tablet Take 1 tablet by mouth daily before breakfast. (Patient not taking: Reported on 03/31/2020) 30 tablet 11  . promethazine (PHENERGAN) 12.5 MG tablet Take 1 tablet (12.5 mg total) by mouth every 8 (eight) hours as needed for nausea or vomiting. (Patient not taking: Reported on 03/31/2020) 20 tablet 0  . Promethazine-Codeine 6.25-10 MG/5ML SOLN Take 42ml by mouth every 6 hours as needed for cough (Patient not taking: Reported on 03/31/2020) 120 mL 0  . sennosides-docusate sodium (SENOKOT-S) 8.6-50 MG tablet Take 2 tablets by mouth daily. (Patient not taking: Reported on 03/31/2020) 30 tablet 1  . triamcinolone cream (KENALOG) 0.5 % APPLY TO AFFECTED  AREA TOPICALLY 4 TIMES A DAY (Patient not taking: Reported on 03/31/2020) 30 g 0  . Vitamin D, Ergocalciferol, (DRISDOL) 50000 units CAPS capsule Take 1 capsule weekly for 8 weeks, then 1.capsule every 2 weeks (Patient not taking: Reported on 03/31/2020) 8 capsule 6   No facility-administered medications prior to visit.    ROS: Review of Systems  Constitutional: Positive for unexpected weight change. Negative for activity change, appetite change, chills, diaphoresis, fatigue and fever.  HENT: Negative for congestion, dental problem, ear pain, hearing loss, mouth sores, postnasal drip, sinus pressure, sneezing, sore throat and voice change.   Eyes: Negative for pain and visual disturbance.  Respiratory: Negative for cough, chest tightness, wheezing and stridor.   Cardiovascular: Negative for chest pain, palpitations and leg swelling.  Gastrointestinal: Negative for abdominal distention, abdominal pain, blood in stool, nausea, rectal pain and vomiting.  Genitourinary: Negative for decreased urine volume, difficulty urinating, dysuria, frequency, hematuria, menstrual problem, vaginal bleeding, vaginal discharge and vaginal pain.  Musculoskeletal: Negative for arthralgias, back pain, gait problem, joint swelling and neck pain.  Skin: Positive for rash. Negative for color change and wound.  Neurological: Negative for dizziness, tremors, syncope, speech difficulty, weakness and light-headedness.  Hematological: Negative for adenopathy.  Psychiatric/Behavioral: Negative for behavioral problems, confusion, decreased concentration, dysphoric mood, hallucinations, sleep disturbance and suicidal ideas. The patient is not nervous/anxious and is not  hyperactive.     Objective:  BP 110/78 (BP Location: Left Arm, Patient Position: Sitting, Cuff Size: Large)   Pulse 92   Temp 99.7 F (37.6 C) (Oral)   Ht 5' (1.524 m)   Wt 192 lb (87.1 kg)   LMP 03/09/2020   SpO2 98%   BMI 37.50 kg/m   BP Readings  from Last 3 Encounters:  03/31/20 110/78  09/09/19 109/73  01/07/19 101/63    Wt Readings from Last 3 Encounters:  03/31/20 192 lb (87.1 kg)  09/09/19 183 lb 6.4 oz (83.2 kg)  09/20/18 176 lb 6.4 oz (80 kg)    Physical Exam Constitutional:      General: She is not in acute distress.    Appearance: She is well-developed. She is obese.  HENT:     Head: Normocephalic.     Right Ear: External ear normal.     Left Ear: External ear normal.     Nose: Nose normal.  Eyes:     General:        Right eye: No discharge.        Left eye: No discharge.     Conjunctiva/sclera: Conjunctivae normal.     Pupils: Pupils are equal, round, and reactive to light.  Neck:     Thyroid: No thyromegaly.     Vascular: No JVD.     Trachea: No tracheal deviation.  Cardiovascular:     Rate and Rhythm: Normal rate and regular rhythm.     Heart sounds: Normal heart sounds.  Pulmonary:     Effort: No respiratory distress.     Breath sounds: No stridor. No wheezing.  Abdominal:     General: Bowel sounds are normal. There is no distension.     Palpations: Abdomen is soft. There is no mass.     Tenderness: There is no abdominal tenderness. There is no guarding or rebound.  Musculoskeletal:        General: No tenderness.     Cervical back: Normal range of motion and neck supple.  Lymphadenopathy:     Cervical: No cervical adenopathy.  Skin:    Findings: Erythema and rash present.  Neurological:     Mental Status: She is oriented to person, place, and time.     Cranial Nerves: No cranial nerve deficit.     Motor: No abnormal muscle tone.     Coordination: Coordination normal.     Deep Tendon Reflexes: Reflexes normal.  Psychiatric:        Behavior: Behavior normal.        Thought Content: Thought content normal.        Judgment: Judgment normal.   acne type rash - chin, cheeks  Lab Results  Component Value Date   WBC 7.6 09/09/2019   HGB 12.2 09/09/2019   HCT 37.1 09/09/2019   PLT 334  09/09/2019   GLUCOSE 90 05/04/2018   ALT 9 05/04/2018   AST 12 05/04/2018   NA 139 05/04/2018   K 3.8 05/04/2018   CL 105 05/04/2018   CREATININE 0.70 05/04/2018   BUN 13 05/04/2018   CO2 28 05/04/2018   TSH 1.15 05/04/2018    DG Chest 2 View  Result Date: 01/07/2019 CLINICAL DATA:  Chest pain.  Shortness of breath. EXAM: CHEST - 2 VIEW COMPARISON:  March 03, 2012 FINDINGS: The heart size and mediastinal contours are within normal limits. Both lungs are clear. The visualized skeletal structures are unremarkable. IMPRESSION: No active cardiopulmonary disease. Electronically Signed  By: Dorise Bullion III M.D   On: 01/07/2019 00:25    Assessment & Plan:   There are no diagnoses linked to this encounter.   No orders of the defined types were placed in this encounter.    Follow-up: No follow-ups on file.  Walker Kehr, MD

## 2020-03-31 NOTE — Assessment & Plan Note (Signed)
Labs Re-check labs H/o iron infusions

## 2020-04-01 ENCOUNTER — Other Ambulatory Visit: Payer: Self-pay | Admitting: Internal Medicine

## 2020-04-01 LAB — IRON,TIBC AND FERRITIN PANEL
%SAT: 9 % (calc) — ABNORMAL LOW (ref 16–45)
Ferritin: 12 ng/mL — ABNORMAL LOW (ref 16–154)
Iron: 35 ug/dL — ABNORMAL LOW (ref 40–190)
TIBC: 403 mcg/dL (calc) (ref 250–450)

## 2020-04-01 LAB — HEMOGLOBIN A1C: Hgb A1c MFr Bld: 5.5 % (ref 4.6–6.5)

## 2020-04-01 MED ORDER — VITAMIN D3 1.25 MG (50000 UT) PO CAPS: 1.0000 | ORAL_CAPSULE | ORAL | 0 refills | Status: DC

## 2020-04-01 MED ORDER — IRON POLYSACCH CMPLX-B12-FA 150-0.025-1 MG PO CAPS: 1.0000 | ORAL_CAPSULE | Freq: Every day | ORAL | 2 refills | Status: DC

## 2020-04-01 MED ORDER — VITAMIN D3 50 MCG (2000 UT) PO CAPS: 2000.0000 [IU] | ORAL_CAPSULE | Freq: Every day | ORAL | 3 refills | Status: DC

## 2020-04-17 ENCOUNTER — Telehealth: Payer: Self-pay | Admitting: Family

## 2020-04-17 NOTE — Telephone Encounter (Signed)
lmom to inform patient of lab/ov/iron infusion appts on 6/24 at 11 am per 6/8 sch msg

## 2020-04-30 ENCOUNTER — Inpatient Hospital Stay: Payer: Self-pay

## 2020-04-30 ENCOUNTER — Inpatient Hospital Stay: Payer: Medicaid Other

## 2020-04-30 ENCOUNTER — Inpatient Hospital Stay: Payer: Medicaid Other | Admitting: Family

## 2020-05-07 ENCOUNTER — Inpatient Hospital Stay: Payer: Self-pay

## 2020-05-25 ENCOUNTER — Other Ambulatory Visit: Payer: Self-pay | Admitting: Family

## 2020-05-25 DIAGNOSIS — D500 Iron deficiency anemia secondary to blood loss (chronic): Secondary | ICD-10-CM

## 2020-05-26 ENCOUNTER — Encounter: Payer: Self-pay | Admitting: Family

## 2020-05-26 ENCOUNTER — Telehealth: Payer: Self-pay | Admitting: Family

## 2020-05-26 ENCOUNTER — Inpatient Hospital Stay (HOSPITAL_BASED_OUTPATIENT_CLINIC_OR_DEPARTMENT_OTHER): Payer: Medicaid Other | Admitting: Family

## 2020-05-26 ENCOUNTER — Inpatient Hospital Stay: Payer: Medicaid Other | Attending: Family

## 2020-05-26 ENCOUNTER — Inpatient Hospital Stay: Payer: Medicaid Other

## 2020-05-26 ENCOUNTER — Other Ambulatory Visit: Payer: Self-pay

## 2020-05-26 VITALS — BP 126/81 | HR 74 | Temp 99.4°F | Resp 18 | Ht 60.0 in | Wt 198.4 lb

## 2020-05-26 DIAGNOSIS — D500 Iron deficiency anemia secondary to blood loss (chronic): Secondary | ICD-10-CM

## 2020-05-26 DIAGNOSIS — Z793 Long term (current) use of hormonal contraceptives: Secondary | ICD-10-CM | POA: Diagnosis not present

## 2020-05-26 DIAGNOSIS — N920 Excessive and frequent menstruation with regular cycle: Secondary | ICD-10-CM | POA: Diagnosis present

## 2020-05-26 LAB — CBC WITH DIFFERENTIAL (CANCER CENTER ONLY)
Abs Immature Granulocytes: 0.08 10*3/uL — ABNORMAL HIGH (ref 0.00–0.07)
Basophils Absolute: 0.1 10*3/uL (ref 0.0–0.1)
Basophils Relative: 1 %
Eosinophils Absolute: 0.2 10*3/uL (ref 0.0–0.5)
Eosinophils Relative: 2 %
HCT: 34.4 % — ABNORMAL LOW (ref 36.0–46.0)
Hemoglobin: 10.7 g/dL — ABNORMAL LOW (ref 12.0–15.0)
Immature Granulocytes: 1 %
Lymphocytes Relative: 34 %
Lymphs Abs: 2.9 10*3/uL (ref 0.7–4.0)
MCH: 25.8 pg — ABNORMAL LOW (ref 26.0–34.0)
MCHC: 31.1 g/dL (ref 30.0–36.0)
MCV: 82.9 fL (ref 80.0–100.0)
Monocytes Absolute: 0.6 10*3/uL (ref 0.1–1.0)
Monocytes Relative: 7 %
Neutro Abs: 4.9 10*3/uL (ref 1.7–7.7)
Neutrophils Relative %: 55 %
Platelet Count: 306 10*3/uL (ref 150–400)
RBC: 4.15 MIL/uL (ref 3.87–5.11)
RDW: 13.9 % (ref 11.5–15.5)
WBC Count: 8.7 10*3/uL (ref 4.0–10.5)
nRBC: 0 % (ref 0.0–0.2)

## 2020-05-26 LAB — RETICULOCYTES
Immature Retic Fract: 24.3 % — ABNORMAL HIGH (ref 2.3–15.9)
RBC.: 4.15 MIL/uL (ref 3.87–5.11)
Retic Count, Absolute: 88.8 10*3/uL (ref 19.0–186.0)
Retic Ct Pct: 2.1 % (ref 0.4–3.1)

## 2020-05-26 NOTE — Telephone Encounter (Signed)
Appointments scheduled calendar printed per 7/20 los 

## 2020-05-26 NOTE — Progress Notes (Signed)
Hematology and Oncology Follow Up Visit  [name] [id number] 03/17/1986 34 y.o. 05/26/2020   Principle Diagnosis:  Iron deficiency anemia secondary to chronic blood lossdue to menorrhagia  Current Therapy:        IV iron as indicated    Interim History:  [name] is here today for follow-up. She is symptomatic with fatigue, headaches and occasional palpitations.  Her cycle is still heavy and she does note clots. Her cycle is regular lasting 4-5 days with the 2nd and 3rd days being her heaviest.  No other blood loss noted. No bruising or petechiae.  No fever, chills, n/v, cough, rash, dizziness, SOB, chest pain, abdominal pain or changes in bowel or bladder habits.  She has constipation at times.  No swelling, tenderness, numbness or tingling in her extremities.  No falls or syncopal episodes to report.  She has maintained a good appetite and is doing her best to stay well hydrated. Her weight is stable.   ECOG Performance Status: 1 - Symptomatic but completely ambulatory  Medications:  Allergies as of 05/26/2020   No Known Allergies     Medication List       Accurate as of May 26, 2020 11:53 AM. If you have any questions, ask your nurse or doctor.        clindamycin 1 % lotion Commonly known as: CLEOCIN T Apply topically 2 (two) times daily. On face rash   etonogestrel-ethinyl estradiol 0.12-0.015 MG/24HR vaginal ring Commonly known as: NUVARING Insert vaginally and leave in place for 3 consecutive weeks, then remove for 1 week.   ferrous sulfate 325 (65 FE) MG tablet Take 325 mg by mouth daily with breakfast.   fluticasone 50 MCG/ACT nasal spray Commonly known as: FLONASE Place 2 sprays into both nostrils daily.   guaiFENesin 600 MG 12 hr tablet Commonly known as: Mucinex Take 1 tablet (600 mg total) by mouth 2 (two) times daily as needed for cough or to loosen phlegm.   Iron Polysacch Cmplx-B12-FA 150-0.025-1 MG Caps Take 1 capsule by mouth daily.     Olopatadine HCl 0.2 % Soln Apply 1 drop to eye 2 (two) times daily.   ondansetron 4 MG tablet Commonly known as: Zofran Take 1 tablet (4 mg total) by mouth every 8 (eight) hours as needed for nausea or vomiting.   oxymetazoline 0.05 % nasal spray Commonly known as: Afrin Nasal Spray Place 1 spray into both nostrils 2 (two) times daily. Use only for 3days, then stop   prenatal vitamin w/FE, FA 27-1 MG Tabs tablet Take 1 tablet by mouth daily before breakfast.   promethazine 12.5 MG tablet Commonly known as: PHENERGAN Take 1 tablet (12.5 mg total) by mouth every 8 (eight) hours as needed for nausea or vomiting.   Promethazine-Codeine 6.25-10 MG/5ML Soln Take 35ml by mouth every 6 hours as needed for cough   sennosides-docusate sodium 8.6-50 MG tablet Commonly known as: SENOKOT-S Take 2 tablets by mouth daily.   triamcinolone cream 0.5 % Commonly known as: KENALOG APPLY TO AFFECTED AREA TOPICALLY 4 TIMES A DAY   Vitamin D3 1.25 MG (50000 UT) Caps Take 1 capsule by mouth once a week.   Vitamin D3 50 MCG (2000 UT) capsule Take 1 capsule (2,000 Units total) by mouth daily.       Allergies: No Known Allergies  Past Medical History, Surgical history, Social history, and Family History were reviewed and updated.  Review of Systems: All other 10 point review of systems is negative.  Physical Exam:  vitals were not taken for this visit.   Wt Readings from Last 3 Encounters:  03/31/20 192 lb (87.1 kg)  09/09/19 183 lb 6.4 oz (83.2 kg)  09/20/18 176 lb 6.4 oz (80 kg)    Ocular: Sclerae unicteric, pupils equal, round and reactive to light Ear-nose-throat: Oropharynx clear, dentition fair Lymphatic: No cervical or supraclavicular adenopathy Lungs no rales or rhonchi, good excursion bilaterally Heart regular rate and rhythm, no murmur appreciated Abd soft, nontender, positive bowel sounds, no liver or spleen tip palpated on exam, no fluid wave  MSK no focal spinal  tenderness, no joint edema Neuro: non-focal, well-oriented, appropriate affect Breasts: Deferred   Lab Results  Component Value Date   WBC 8.7 05/26/2020   HGB 10.7 (L) 05/26/2020   HCT 34.4 (L) 05/26/2020   MCV 82.9 05/26/2020   PLT 306 05/26/2020   Lab Results  Component Value Date   FERRITIN 12 (L) 03/31/2020   IRON 35 (L) 03/31/2020   TIBC 403 03/31/2020   UIBC 215 09/20/2018   IRONPCTSAT 9 (L) 03/31/2020   Lab Results  Component Value Date   RETICCTPCT 2.1 05/26/2020   RBC 4.15 05/26/2020   No results found for: KPAFRELGTCHN, LAMBDASER, KAPLAMBRATIO Lab Results  Component Value Date   IGA 310 11/16/2009   No results found for: Dorene Ar, A1GS, A2GS, Karn Pickler, SPEI   Chemistry      Component Value Date/Time   NA 137 03/31/2020 1534   K 3.8 03/31/2020 1534   CL 106 03/31/2020 1534   CO2 27 03/31/2020 1534   BUN 11 03/31/2020 1534   CREATININE 0.75 03/31/2020 1534      Component Value Date/Time   CALCIUM 9.5 03/31/2020 1534   ALKPHOS 64 03/31/2020 1534   AST 15 03/31/2020 1534   ALT 11 03/31/2020 1534   BILITOT 0.4 03/31/2020 1534       Impression and Plan: [name] is a very pleasant 34 yo African American female with iron deficiency anemia secondary to heavy cycles.  We will see what her iron studies look like ad replace is needed.  We will plan to see her in another 3 months.  She will contact our office with any questions or concerns. We can certainly see her sooner if need be.   Emeline Gins, NP 7/20/202111:53 AM

## 2020-05-27 ENCOUNTER — Other Ambulatory Visit: Payer: Self-pay | Admitting: Family

## 2020-05-27 ENCOUNTER — Telehealth: Payer: Self-pay | Admitting: Family

## 2020-05-27 ENCOUNTER — Encounter: Payer: Self-pay | Admitting: Pharmacy Technician

## 2020-05-27 LAB — IRON AND TIBC
Iron: 41 ug/dL (ref 41–142)
Saturation Ratios: 10 % — ABNORMAL LOW (ref 21–57)
TIBC: 390 ug/dL (ref 236–444)
UIBC: 350 ug/dL (ref 120–384)

## 2020-05-27 LAB — FERRITIN: Ferritin: 15 ng/mL (ref 11–307)

## 2020-05-27 NOTE — Telephone Encounter (Signed)
Called and spoke with patient regarding iron infusion appointments that were added.  She was ok with both dates/times per 7/21 secure chat

## 2020-05-27 NOTE — Progress Notes (Signed)
Patient has been approved for drug assistance by [employer] for Feraheme. The enrollment period is from 05/26/20-05/25/21 based on self pay. First DOS covered is 06/01/20.

## 2020-06-01 ENCOUNTER — Other Ambulatory Visit: Payer: Self-pay

## 2020-06-01 ENCOUNTER — Inpatient Hospital Stay: Payer: Medicaid Other

## 2020-06-01 VITALS — BP 113/65 | HR 69 | Temp 99.4°F | Resp 18

## 2020-06-01 DIAGNOSIS — D500 Iron deficiency anemia secondary to blood loss (chronic): Secondary | ICD-10-CM | POA: Diagnosis not present

## 2020-06-01 DIAGNOSIS — N920 Excessive and frequent menstruation with regular cycle: Secondary | ICD-10-CM

## 2020-06-01 MED ORDER — SODIUM CHLORIDE 0.9 % IV SOLN
510.0000 mg | Freq: Once | INTRAVENOUS | Status: AC
  Administered 2020-06-01: 510 mg via INTRAVENOUS
  Filled 2020-06-01: qty 510

## 2020-06-01 NOTE — Patient Instructions (Signed)

## 2020-06-08 ENCOUNTER — Inpatient Hospital Stay: Payer: Medicaid Other | Attending: Family

## 2020-06-08 ENCOUNTER — Other Ambulatory Visit: Payer: Self-pay

## 2020-06-08 VITALS — BP 115/73 | HR 69 | Temp 99.4°F | Resp 17

## 2020-06-08 DIAGNOSIS — N920 Excessive and frequent menstruation with regular cycle: Secondary | ICD-10-CM | POA: Diagnosis present

## 2020-06-08 DIAGNOSIS — D500 Iron deficiency anemia secondary to blood loss (chronic): Secondary | ICD-10-CM | POA: Insufficient documentation

## 2020-06-08 MED ORDER — SODIUM CHLORIDE 0.9 % IV SOLN
510.0000 mg | Freq: Once | INTRAVENOUS | Status: AC
  Administered 2020-06-08: 510 mg via INTRAVENOUS
  Filled 2020-06-08: qty 17

## 2020-06-08 NOTE — Patient Instructions (Signed)

## 2020-07-01 ENCOUNTER — Other Ambulatory Visit: Payer: Self-pay | Admitting: Internal Medicine

## 2020-08-21 ENCOUNTER — Other Ambulatory Visit: Payer: Medicaid Other

## 2020-08-24 NOTE — Progress Notes (Signed)
The following Assist/Replace Program for Feraheme from [employer] has been terminated due to Medicaid coverage starting 08/07/2020.  Last DOS:06/08/2020

## 2020-08-26 ENCOUNTER — Inpatient Hospital Stay: Payer: Medicaid Other

## 2020-08-26 ENCOUNTER — Inpatient Hospital Stay: Payer: Medicaid Other | Admitting: Family

## 2020-09-02 ENCOUNTER — Other Ambulatory Visit: Payer: Self-pay | Admitting: Family

## 2020-09-03 ENCOUNTER — Ambulatory Visit: Payer: Medicaid Other

## 2020-12-14 ENCOUNTER — Telehealth (INDEPENDENT_AMBULATORY_CARE_PROVIDER_SITE_OTHER): Payer: 59 | Admitting: Family

## 2020-12-14 ENCOUNTER — Other Ambulatory Visit: Payer: Self-pay

## 2020-12-14 ENCOUNTER — Telehealth: Payer: Medicaid Other | Admitting: Family

## 2020-12-14 DIAGNOSIS — J209 Acute bronchitis, unspecified: Secondary | ICD-10-CM

## 2020-12-14 MED ORDER — AZITHROMYCIN 250 MG PO TABS: ORAL_TABLET | ORAL | 0 refills | Status: DC

## 2020-12-14 MED ORDER — ALBUTEROL SULFATE HFA 108 (90 BASE) MCG/ACT IN AERS: 2.0000 | INHALATION_SPRAY | Freq: Four times a day (QID) | RESPIRATORY_TRACT | 0 refills | Status: DC | PRN

## 2020-12-14 NOTE — Progress Notes (Signed)
[name] is a 35 y.o. female with the following history as recorded in [employer]:  Patient Active Problem List   Diagnosis Date Noted  . Well adult exam 03/31/2020  . Suspected COVID-19 virus infection 05/14/2019  . Chest pain, atypical 05/04/2018  . Iron deficiency anemia due to chronic blood loss 09/16/2016  . Menorrhagia 09/16/2016  . Stung by yellow jacket 06/24/2015  . Fracture of radius, distal, right, closed 03/20/2015  . Dizziness 11/19/2013  . Rash, skin 07/22/2011  . CONSTIPATION, CHRONIC 12/17/2010  . Vitamin D deficiency 09/10/2010  . Alopecia 09/10/2010  . Iron deficiency anemia 11/16/2009  . Nausea alone 08/05/2009  . EPIGASTRIC PAIN 08/05/2009  . Fatigue 04/24/2008  . Allergic rhinitis 12/02/2007  . ANXIETY 07/09/2007  . GERD 07/06/2007  . HEART MURMUR, HX OF 07/06/2007    Current Outpatient Medications  Medication Sig Dispense Refill  . albuterol (VENTOLIN HFA) 108 (90 Base) MCG/ACT inhaler Inhale 2 puffs into the lungs every 6 (six) hours as needed for wheezing or shortness of breath. 8 g 0  . azithromycin (ZITHROMAX) 250 MG tablet 2 tabs po qd x 1 day; 1 tablet per day x 4 days; 6 tablet 0  . Cholecalciferol (VITAMIN D3) 1.25 MG (50000 UT) CAPS TAKE 1 CAPSULE BY MOUTH ONE TIME PER WEEK 4 capsule 1  . Cholecalciferol (VITAMIN D3) 50 MCG (2000 UT) capsule Take 1 capsule (2,000 Units total) by mouth daily. 100 capsule 3  . clindamycin (CLEOCIN T) 1 % lotion Apply topically 2 (two) times daily. On face rash 60 mL 3  . etonogestrel-ethinyl estradiol (NUVARING) 0.12-0.015 MG/24HR vaginal ring Insert vaginally and leave in place for 3 consecutive weeks, then remove for 1 week. 1 each 12  . fluticasone (FLONASE) 50 MCG/ACT nasal spray Place 2 sprays into both nostrils daily. 16 g 2  . guaiFENesin (MUCINEX) 600 MG 12 hr tablet Take 1 tablet (600 mg total) by mouth 2 (two) times daily as needed for cough or to loosen phlegm. 14 tablet 0  . Olopatadine HCl 0.2 % SOLN  Apply 1 drop to eye 2 (two) times daily. 2.5 mL 0  . ondansetron (ZOFRAN) 4 MG tablet Take 1 tablet (4 mg total) by mouth every 8 (eight) hours as needed for nausea or vomiting. 12 tablet 0  . oxymetazoline (AFRIN NASAL SPRAY) 0.05 % nasal spray Place 1 spray into both nostrils 2 (two) times daily. Use only for 3days, then stop 30 mL 0  . promethazine (PHENERGAN) 12.5 MG tablet Take 1 tablet (12.5 mg total) by mouth every 8 (eight) hours as needed for nausea or vomiting. 20 tablet 0   No current facility-administered medications for this visit.    Allergies: Patient has no known allergies.  Past Medical History:  Diagnosis Date  . Anemia    no current med.  . Fracture of radius, distal, right, closed 03/20/2015  . GERD (gastroesophageal reflux disease)    OTC as needed  . History of cardiac murmur    states workup 2013 - no problems identified  . Radius fracture 03/16/2015   right    Past Surgical History:  Procedure Laterality Date  . CESAREAN SECTION  03/15/2006  . OPEN REDUCTION INTERNAL FIXATION (ORIF) DISTAL RADIAL FRACTURE Right 03/20/2015   Procedure: OPEN REDUCTION INTERNAL FIXATION RIGHT DISTAL RADIUS;  Surgeon: Teryl Lucy, MD;  Location: [facility];  Service: Orthopedics;  Laterality: Right;    No family history on file.  Social History   Tobacco  Use  . Smoking status: Never Smoker  . Smokeless tobacco: Never Used  Substance Use Topics  . Alcohol use: Yes    Comment: occasionally    Subjective:   I connected with [name] on 12/14/20 at  1:40 PM EST by a video enabled telemedicine application and verified that I am speaking with the correct person using two identifiers.   I discussed the limitations of evaluation and management by telemedicine and the availability of in person appointments. The patient expressed understanding and agreed to proceed. Provider in office/ patient is at home; provider and patient are only 2 people on video call.   Sinus  congestion/ cough and chest congestion x 1 week; using OTC Sudafed and Mucinex with limited benefit; has not tested for COVID- notes she most likely had it 1 month ago when she was sick and her entire family tested positive at that time; feels like her chest is "tight"- no history of asthma; no fever;     Objective:  There were no vitals filed for this visit.  General: Well developed, well nourished, in no acute distress  Head: Normocephalic and atraumatic  Lungs: Respirations unlabored;  Neurologic: Alert and oriented; speech intact; face symmetrical; moves all extremities well; CNII-XII intact without focal deficit   Assessment:  1. Acute bronchitis, unspecified organism     Plan:  Rx for Z-pak #1 take as directed; Rx for albuterol inhaler; continue Mucinex and Sudafed as needed; increase fluids, rest and follow-up worse, no better. Will need to consider CXR if symptoms persist;     No follow-ups on file.  No orders of the defined types were placed in this encounter.   Requested Prescriptions   Signed Prescriptions Disp Refills  . azithromycin (ZITHROMAX) 250 MG tablet 6 tablet 0    Sig: 2 tabs po qd x 1 day; 1 tablet per day x 4 days;  . albuterol (VENTOLIN HFA) 108 (90 Base) MCG/ACT inhaler 8 g 0    Sig: Inhale 2 puffs into the lungs every 6 (six) hours as needed for wheezing or shortness of breath.

## 2021-01-30 ENCOUNTER — Encounter (HOSPITAL_COMMUNITY): Payer: Self-pay | Admitting: Emergency Medicine

## 2021-01-30 ENCOUNTER — Other Ambulatory Visit: Payer: Self-pay

## 2021-01-30 ENCOUNTER — Emergency Department (HOSPITAL_COMMUNITY): Payer: 59

## 2021-01-30 ENCOUNTER — Emergency Department (HOSPITAL_COMMUNITY)
Admission: EM | Admit: 2021-01-30 | Discharge: 2021-01-30 | Disposition: A | Discharge status: Home / Self Care | Payer: 59 | Attending: Emergency Medicine | Admitting: Emergency Medicine

## 2021-01-30 DIAGNOSIS — R079 Chest pain, unspecified: Secondary | ICD-10-CM | POA: Diagnosis present

## 2021-01-30 LAB — CBC
HCT: 39.5 % (ref 36.0–46.0)
Hemoglobin: 13.2 g/dL (ref 12.0–15.0)
MCH: 29.1 pg (ref 26.0–34.0)
MCHC: 33.4 g/dL (ref 30.0–36.0)
MCV: 87.2 fL (ref 80.0–100.0)
Platelets: 326 10*3/uL (ref 150–400)
RBC: 4.53 MIL/uL (ref 3.87–5.11)
RDW: 12.4 % (ref 11.5–15.5)
WBC: 9.5 10*3/uL (ref 4.0–10.5)
nRBC: 0 % (ref 0.0–0.2)

## 2021-01-30 LAB — BASIC METABOLIC PANEL
Anion gap: 9 (ref 5–15)
BUN: 15 mg/dL (ref 6–20)
CO2: 24 mmol/L (ref 22–32)
Calcium: 9.2 mg/dL (ref 8.9–10.3)
Chloride: 102 mmol/L (ref 98–111)
Creatinine, Ser: 0.74 mg/dL (ref 0.44–1.00)
GFR, Estimated: >60 mL/min (ref >60)
Glucose, Bld: 101 mg/dL — ABNORMAL HIGH (ref 70–99)
Potassium: 3.6 mmol/L (ref 3.5–5.1)
Sodium: 135 mmol/L (ref 135–145)

## 2021-01-30 LAB — TROPONIN I (HIGH SENSITIVITY): Troponin I (High Sensitivity): 5 ng/L (ref <18)

## 2021-01-30 LAB — I-STAT BETA HCG BLOOD, ED (NOT ORDERABLE): I-stat hCG, quantitative: <5 m[IU]/mL (ref <5)

## 2021-01-30 NOTE — Discharge Instructions (Signed)
If you develop recurrent, continued, or worsening chest pain, shortness of breath, fever, vomiting, abdominal or back pain, or any other new/concerning symptoms then return to the ER for evaluation.  

## 2021-01-30 NOTE — ED Provider Notes (Signed)
[facility]-EMERGENCY DEPT Provider Note   CSN: [id number] Arrival date & time: 01/30/21  1509     History Chief Complaint  Patient presents with  . Chest Pain    [name] is a 35 y.o. female.  HPI 35 year old female presents with chest pain. Started 5 days ago. At first it was in the middle of her chest, now is inferior to left breast. Has had this type of pain many times before without clear etiology. Has followed up with cardiology. Pain at first was better with certain positions but now is all the time. Rated as a 3/10. Perhaps mild dyspnea when she was walking fast in the store yesterday, otherwise no dyspnea. No cough, fever, abd/back pain. No leg swelling, recent travel, OCP use. No dvt history. Has not taken anything for it recently but did try gas medicine at first thinking it might be reflux.  This feels like similar chest pain she's had in the past but is lasting longer.   Past Medical History:  Diagnosis Date  . Anemia    no current med.  . Fracture of radius, distal, right, closed 03/20/2015  . GERD (gastroesophageal reflux disease)    OTC as needed  . History of cardiac murmur    states workup 2013 - no problems identified  . Radius fracture 03/16/2015   right    Patient Active Problem List   Diagnosis Date Noted  . Well adult exam 03/31/2020  . Suspected COVID-19 virus infection 05/14/2019  . Chest pain, atypical 05/04/2018  . Iron deficiency anemia due to chronic blood loss 09/16/2016  . Menorrhagia 09/16/2016  . Stung by yellow jacket 06/24/2015  . Fracture of radius, distal, right, closed 03/20/2015  . Dizziness 11/19/2013  . Rash, skin 07/22/2011  . CONSTIPATION, CHRONIC 12/17/2010  . Vitamin D deficiency 09/10/2010  . Alopecia 09/10/2010  . Iron deficiency anemia 11/16/2009  . Nausea alone 08/05/2009  . EPIGASTRIC PAIN 08/05/2009  . Fatigue 04/24/2008  . Allergic rhinitis 12/02/2007  . ANXIETY 07/09/2007  . GERD 07/06/2007   . HEART MURMUR, HX OF 07/06/2007    Past Surgical History:  Procedure Laterality Date  . CESAREAN SECTION  03/15/2006  . OPEN REDUCTION INTERNAL FIXATION (ORIF) DISTAL RADIAL FRACTURE Right 03/20/2015   Procedure: OPEN REDUCTION INTERNAL FIXATION RIGHT DISTAL RADIUS;  Surgeon: Teryl Lucy, MD;  Location: [facility];  Service: Orthopedics;  Laterality: Right;     OB History    Gravida  3   Para  1   Term  1   Preterm      AB  2   Living  1     SAB  1   IAB  1   Ectopic      Multiple      Live Births  1           No family history on file.  Social History   Tobacco Use  . Smoking status: Never Smoker  . Smokeless tobacco: Never Used  Vaping Use  . Vaping Use: Never used  Substance Use Topics  . Alcohol use: Yes    Comment: occasionally  . Drug use: No    Home Medications Prior to Admission medications   Medication Sig Start Date End Date Taking? Authorizing Provider  albuterol (VENTOLIN HFA) 108 (90 Base) MCG/ACT inhaler Inhale 2 puffs into the lungs every 6 (six) hours as needed for wheezing or shortness of breath. 12/14/20   Ria Clock  Margarita Grizzle, FNP  azithromycin (ZITHROMAX) 250 MG tablet 2 tabs po qd x 1 day; 1 tablet per day x 4 days; 12/14/20   Olive Bass, FNP  Cholecalciferol (VITAMIN D3) 1.25 MG (50000 UT) CAPS TAKE 1 CAPSULE BY MOUTH ONE TIME PER WEEK 07/05/20   Plotnikov, [name], MD  Cholecalciferol (VITAMIN D3) 50 MCG (2000 UT) capsule Take 1 capsule (2,000 Units total) by mouth daily. 04/01/20   Plotnikov, [name], MD  clindamycin (CLEOCIN T) 1 % lotion Apply topically 2 (two) times daily. On face rash 03/31/20   Plotnikov, [name], MD  etonogestrel-ethinyl estradiol (NUVARING) 0.12-0.015 MG/24HR vaginal ring Insert vaginally and leave in place for 3 consecutive weeks, then remove for 1 week. 09/09/19   Brock Bad, MD  fluticasone (FLONASE) 50 MCG/ACT nasal spray Place 2 sprays into both nostrils daily.  02/13/18   Plotnikov, [name], MD  guaiFENesin (MUCINEX) 600 MG 12 hr tablet Take 1 tablet (600 mg total) by mouth 2 (two) times daily as needed for cough or to loosen phlegm. 12/01/16   Nche, Bonna Gains, NP  Olopatadine HCl 0.2 % SOLN Apply 1 drop to eye 2 (two) times daily. 05/14/19   Myrlene Broker, MD  ondansetron (ZOFRAN) 4 MG tablet Take 1 tablet (4 mg total) by mouth every 8 (eight) hours as needed for nausea or vomiting. 11/18/18   Plotnikov, [name], MD  oxymetazoline (AFRIN NASAL SPRAY) 0.05 % nasal spray Place 1 spray into both nostrils 2 (two) times daily. Use only for 3days, then stop 12/01/16   Nche, Bonna Gains, NP  promethazine (PHENERGAN) 12.5 MG tablet Take 1 tablet (12.5 mg total) by mouth every 8 (eight) hours as needed for nausea or vomiting. 11/19/18   Plotnikov, [name], MD    Allergies    Patient has no known allergies.  Review of Systems   Review of Systems  Constitutional: Negative for fever.  Respiratory: Positive for shortness of breath. Negative for cough.   Cardiovascular: Positive for chest pain. Negative for leg swelling.  Gastrointestinal: Negative for abdominal pain.  All other systems reviewed and are negative.   Physical Exam Updated Vital Signs BP (!) 110/59   Pulse 83   Temp 98.1 F (36.7 C) (Oral)   Resp 16   LMP 01/16/2021   SpO2 100%   Physical Exam Vitals and nursing note reviewed.  Constitutional:      General: She is not in acute distress.    Appearance: She is well-developed. She is not ill-appearing or diaphoretic.  HENT:     Head: Normocephalic and atraumatic.     Right Ear: External ear normal.     Left Ear: External ear normal.     Nose: Nose normal.  Eyes:     General:        Right eye: No discharge.        Left eye: No discharge.  Cardiovascular:     Rate and Rhythm: Normal rate and regular rhythm.     Heart sounds: Normal heart sounds.  Pulmonary:     Effort: Pulmonary effort is normal.     Breath sounds:  Normal breath sounds.  Abdominal:     Palpations: Abdomen is soft.     Tenderness: There is no abdominal tenderness.  Musculoskeletal:     Right lower leg: No tenderness. No edema.     Left lower leg: No tenderness. No edema.  Skin:    General: Skin is warm and dry.  Neurological:     Mental Status: She is alert.  Psychiatric:        Mood and Affect: Mood is not anxious.     ED Results / Procedures / Treatments   Labs (all labs ordered are listed, but only abnormal results are displayed) Labs Reviewed  BASIC METABOLIC PANEL - Abnormal; Notable for the following components:      Result Value   Glucose, Bld 101 (*)    All other components within normal limits  CBC  I-STAT BETA HCG BLOOD, ED ([facility], [facility], [facility] ONLY)  I-STAT BETA HCG BLOOD, ED (NOT ORDERABLE)  TROPONIN I (HIGH SENSITIVITY)  TROPONIN I (HIGH SENSITIVITY)    EKG EKG Interpretation  Date/Time:  Saturday January 30 2021 15:28:35 EDT Ventricular Rate:  88 PR Interval:    QRS Duration: 82 QT Interval:  351 QTC Calculation: 425 R Axis:   68 Text Interpretation: Sinus rhythm Borderline short PR interval 12 Lead; Mason-Likar tachycardia no longer present when compared to Mar 2020 Confirmed by Pricilla Loveless [id number]) on 01/30/2021 4:43:19 PM   Radiology DG Chest 2 View  Result Date: 01/30/2021 CLINICAL DATA:  Chest pain EXAM: CHEST - 2 VIEW COMPARISON:  01/07/2019 FINDINGS: The heart size and mediastinal contours are within normal limits. Both lungs are clear. The visualized skeletal structures are unremarkable. IMPRESSION: No acute abnormality of the lungs. Electronically Signed   By: Lauralyn Primes M.D.   On: 01/30/2021 15:47    Procedures Procedures   Medications Ordered in ED Medications - No data to display  ED Course  I have reviewed the triage vital signs and the nursing notes.  Pertinent labs & imaging results that were available during my care of the patient were reviewed by me and considered in my medical  decision making (see chart for details).    MDM Rules/Calculators/A&P                          No clear etiology for her chest pain. My suspicion is this is inflammation/pleurisy. I have recommended NSAIDs. Low suspicion for PE, and is PERC negative. Troponin negative after days of symptoms. Doubt occult pneumonia, pneumothorax, dissection. Will d/c home with return precautions and PCP follow up.  Final Clinical Impression(s) / ED Diagnoses Final diagnoses:  Nonspecific chest pain    Rx / DC Orders ED Discharge Orders    None       Pricilla Loveless, MD 01/30/21 1717

## 2021-01-30 NOTE — ED Triage Notes (Signed)
Patient reports chest pain x5 days. Reports initially was central chest than moved to left chest. States constant dull aching. Also reports fatigue. Denies SOB, fever, cough.

## 2021-02-02 ENCOUNTER — Ambulatory Visit (INDEPENDENT_AMBULATORY_CARE_PROVIDER_SITE_OTHER): Payer: 59 | Admitting: Cardiology

## 2021-02-02 ENCOUNTER — Ambulatory Visit: Payer: 59 | Admitting: Internal Medicine

## 2021-02-02 ENCOUNTER — Other Ambulatory Visit: Payer: Self-pay

## 2021-02-02 ENCOUNTER — Encounter: Payer: Self-pay | Admitting: Cardiology

## 2021-02-02 VITALS — BP 112/80 | HR 96 | Ht 60.0 in | Wt 202.4 lb

## 2021-02-02 DIAGNOSIS — R0789 Other chest pain: Secondary | ICD-10-CM

## 2021-02-02 NOTE — Progress Notes (Signed)
Cardiology Office Note   Date:  02/02/2021   ID:  [name] Courtney, DOB 1986/06/16, MRN [id number]  PCP:  Tresa Garter, MD  Cardiologist:   No primary care provider on file. Referring:  Self  Chief Complaint  Patient presents with  . Chest Pain      History of Present Illness: [name] is a 35 y.o. female who presents for evaluation of chest pain.  She has had no past cardiac history.  She was in the emergency room on the 26th for chest discomfort that have been going on for a while.  I reviewed these records for this note.  This was thought to be nonanginal and cardiac enzymes are negative.  There were no acute EKG changes.  She says the discomfort has been a constant discomfort.  It is under and around the left breast.  Its been a dull ache.  Seems to be a little bit more she noticed just today when she lies flat.  It 2 out of 10 in intensity.  She has had to sleep up with 3 pillows which is unusual.  This was happening for a few days but actually stopped a few days ago.  She is now able to lie flat back.  She took omeprazole and Alka-Seltzer without help.  She had a little mild dyspnea on exertion but no nausea or vomiting.  She was not describing neck or arm discomfort.  She did have increased fatigue.  She was not describing cough fevers or chills.  She has never had the symptoms before.  She has had no prior cardiac history or work-up.  She is doing chores of daily living but has not been physically active since all this started.  She is not describing any leg or calf pain.   Past Medical History:  Diagnosis Date  . Anemia    no current med.  . Fracture of radius, distal, right, closed 03/20/2015  . GERD (gastroesophageal reflux disease)    OTC as needed  . History of cardiac murmur    states workup 2013 - no problems identified  . Radius fracture 03/16/2015   right    Past Surgical History:  Procedure Laterality Date  . CESAREAN SECTION  03/15/2006  . OPEN  REDUCTION INTERNAL FIXATION (ORIF) DISTAL RADIAL FRACTURE Right 03/20/2015   Procedure: OPEN REDUCTION INTERNAL FIXATION RIGHT DISTAL RADIUS;  Surgeon: Teryl Lucy, MD;  Location: [facility];  Service: Orthopedics;  Laterality: Right;     Current Outpatient Medications  Medication Sig Dispense Refill  . albuterol (VENTOLIN HFA) 108 (90 Base) MCG/ACT inhaler Inhale 2 puffs into the lungs every 6 (six) hours as needed for wheezing or shortness of breath. 8 g 0  . Cholecalciferol (VITAMIN D3) 50 MCG (2000 UT) capsule Take 1 capsule (2,000 Units total) by mouth daily. 100 capsule 3  . etonogestrel-ethinyl estradiol (NUVARING) 0.12-0.015 MG/24HR vaginal ring Insert vaginally and leave in place for 3 consecutive weeks, then remove for 1 week. 1 each 12  . fluticasone (FLONASE) 50 MCG/ACT nasal spray Place 2 sprays into both nostrils daily. 16 g 2  . guaiFENesin (MUCINEX) 600 MG 12 hr tablet Take 1 tablet (600 mg total) by mouth 2 (two) times daily as needed for cough or to loosen phlegm. 14 tablet 0  . Olopatadine HCl 0.2 % SOLN Apply 1 drop to eye 2 (two) times daily. 2.5 mL 0   No current facility-administered medications for this visit.  Allergies:   Patient has no known allergies.    Social History:  The patient  reports that she has never smoked. She has never used smokeless tobacco. She reports current alcohol use. She reports that she does not use drugs.   Family History:  The patient's family history includes Heart failure in her paternal grandmother.    ROS:  Please see the history of present illness.   Otherwise, review of systems are positive for none.   All other systems are reviewed and negative.    PHYSICAL EXAM: VS:  BP 112/80   Pulse 96   Ht 5' (1.524 m)   Wt 202 lb 6.4 oz (91.8 kg)   LMP 01/16/2021   SpO2 99%   BMI 39.53 kg/m  , BMI Body mass index is 39.53 kg/m. GENERAL:  Well appearing HEENT:  Pupils equal round and reactive, fundi not visualized,  oral mucosa unremarkable NECK:  No jugular venous distention, waveform within normal limits, carotid upstroke brisk and symmetric, no bruits, no thyromegaly LYMPHATICS:  No cervical, inguinal adenopathy LUNGS:  Clear to auscultation bilaterally BACK:  No CVA tenderness CHEST:  Unremarkable HEART:  PMI not displaced or sustained,S1 and S2 within normal limits, no S3, no S4, no clicks, no rubs, no murmurs ABD:  Flat, positive bowel sounds normal in frequency in pitch, no bruits, no rebound, no guarding, no midline pulsatile mass, no hepatomegaly, no splenomegaly EXT:  2 plus pulses throughout, no edema, no cyanosis no clubbing SKIN:  No rashes no nodules NEURO:  Cranial nerves II through XII grossly intact, motor grossly intact throughout PSYCH:  Cognitively intact, oriented to person place and time    EKG:  EKG is ordered today. The ekg ordered today demonstrates sinus rhythm, sinus arrhythmia, rate 80, axis within normal limits, intervals within normal limits, no T wave changes.   Recent Labs: 03/31/2020: ALT 11; TSH 1.81 01/30/2021: BUN 15; Creatinine, Ser 0.74; Hemoglobin 13.2; Platelets 326; Potassium 3.6; Sodium 135    Lipid Panel    Component Value Date/Time   CHOL 136 03/31/2020 1534   TRIG 58.0 03/31/2020 1534   HDL 46.70 03/31/2020 1534   CHOLHDL 3 03/31/2020 1534   VLDL 11.6 03/31/2020 1534   LDLCALC 78 03/31/2020 1534      Wt Readings from Last 3 Encounters:  02/02/21 202 lb 6.4 oz (91.8 kg)  05/26/20 198 lb 6.4 oz (90 kg)  03/31/20 192 lb (87.1 kg)      Other studies Reviewed: Additional studies/ records that were reviewed today include: ED records. Review of the above records demonstrates:  Please see elsewhere in the note.     ASSESSMENT AND PLAN:  Chest pain: Her chest discomfort does not sound like acute coronary syndrome and there was no evidence of this at the time of presentation in the emergency room.  EKG is unremarkable.  Exam is unremarkable.   This does not represent unstable angina.  No further ischemia work-up is suggested.  From a cardiac standpoint this could be a pericarditis and I will evaluate with an echocardiogram.  If however this is unremarkable I would not further cardiovascular testing or would consider GI or musculoskeletal etiologies.   Current medicines are reviewed at length with the patient today.  The patient does not have concerns regarding medicines.  The following changes have been made:  no change  Labs/ tests ordered today include:   Orders Placed This Encounter  Procedures  . EKG 12-Lead  . ECHOCARDIOGRAM COMPLETE  Disposition:   FU with me as needed.      Signed, Rollene Rotunda, MD  02/02/2021 5:42 PM    [facility]

## 2021-02-02 NOTE — Patient Instructions (Signed)
Medication Instructions:  Continue current medications  *If you need a refill on your cardiac medications before your next appointment, please call your pharmacy*   Lab Work: None Ordered  Testing/Procedures: Your physician has requested that you have an echocardiogram. Echocardiography is a painless test that uses sound waves to create images of your heart. It provides your doctor with information about the size and shape of your heart and how well your heart's chambers and valves are working. This procedure takes approximately one hour. There are no restrictions for this procedure.   Follow-Up: At [facility], you and your health needs are our priority.  As part of our continuing mission to provide you with exceptional heart care, we have created designated Provider Care Teams.  These Care Teams include your primary Cardiologist (physician) and Advanced Practice Providers (APPs -  Physician Assistants and Nurse Practitioners) who all work together to provide you with the care you need, when you need it.  We recommend signing up for the patient portal called "MyChart".  Sign up information is provided on this After Visit Summary.  MyChart is used to connect with patients for Virtual Visits (Telemedicine).  Patients are able to view lab/test results, encounter notes, upcoming appointments, etc.  Non-urgent messages can be sent to your provider as well.   To learn more about what you can do with MyChart, go to [email or web address].    Your next appointment:   As Needed   

## 2021-03-05 ENCOUNTER — Ambulatory Visit (HOSPITAL_COMMUNITY): Payer: 59 | Attending: Cardiovascular Disease

## 2021-03-05 ENCOUNTER — Other Ambulatory Visit: Payer: Self-pay

## 2021-03-05 DIAGNOSIS — R0789 Other chest pain: Secondary | ICD-10-CM | POA: Diagnosis present

## 2021-03-05 LAB — ECHOCARDIOGRAM COMPLETE
Area-P 1/2: 3.83 cm2
S' Lateral: 3.1 cm

## 2021-03-10 ENCOUNTER — Ambulatory Visit: Payer: 59 | Admitting: Internal Medicine

## 2021-03-22 ENCOUNTER — Ambulatory Visit: Payer: 59 | Admitting: Internal Medicine

## 2021-03-25 ENCOUNTER — Encounter: Payer: Self-pay | Admitting: Internal Medicine

## 2021-03-25 ENCOUNTER — Ambulatory Visit (INDEPENDENT_AMBULATORY_CARE_PROVIDER_SITE_OTHER): Payer: 59 | Admitting: Internal Medicine

## 2021-03-25 ENCOUNTER — Other Ambulatory Visit: Payer: Self-pay

## 2021-03-25 VITALS — BP 118/68 | HR 93 | Temp 98.1°F | Ht 60.0 in | Wt 200.6 lb

## 2021-03-25 DIAGNOSIS — R635 Abnormal weight gain: Secondary | ICD-10-CM | POA: Diagnosis not present

## 2021-03-25 DIAGNOSIS — E559 Vitamin D deficiency, unspecified: Secondary | ICD-10-CM | POA: Diagnosis not present

## 2021-03-25 DIAGNOSIS — D500 Iron deficiency anemia secondary to blood loss (chronic): Secondary | ICD-10-CM

## 2021-03-25 DIAGNOSIS — L659 Nonscarring hair loss, unspecified: Secondary | ICD-10-CM

## 2021-03-25 NOTE — Assessment & Plan Note (Signed)
Risks associated with treatment noncompliance were discussed. Compliance was encouraged. Vit D level

## 2021-03-25 NOTE — Progress Notes (Signed)
Subjective:  Patient ID: [name], female    DOB: [birth year]  Age: 35 y.o. MRN: [id number]  CC: Follow-up (F/U ON IRON LEVELS)   HPI [name] presents for anemia, hair loss, Vit D def  Outpatient Medications Prior to Visit  Medication Sig Dispense Refill  . albuterol (VENTOLIN HFA) 108 (90 Base) MCG/ACT inhaler Inhale 2 puffs into the lungs every 6 (six) hours as needed for wheezing or shortness of breath. 8 g 0  . Cholecalciferol (VITAMIN D3) 50 MCG (2000 UT) capsule Take 1 capsule (2,000 Units total) by mouth daily. 100 capsule 3  . etonogestrel-ethinyl estradiol (NUVARING) 0.12-0.015 MG/24HR vaginal ring Insert vaginally and leave in place for 3 consecutive weeks, then remove for 1 week. 1 each 12  . Olopatadine HCl 0.2 % SOLN Apply 1 drop to eye 2 (two) times daily. 2.5 mL 0  . fluticasone (FLONASE) 50 MCG/ACT nasal spray Place 2 sprays into both nostrils daily. (Patient not taking: Reported on 03/25/2021) 16 g 2  . guaiFENesin (MUCINEX) 600 MG 12 hr tablet Take 1 tablet (600 mg total) by mouth 2 (two) times daily as needed for cough or to loosen phlegm. (Patient not taking: Reported on 03/25/2021) 14 tablet 0   No facility-administered medications prior to visit.    ROS: Review of Systems  Constitutional: Positive for fatigue and unexpected weight change. Negative for activity change, appetite change and chills.  HENT: Negative for congestion, mouth sores and sinus pressure.   Eyes: Negative for visual disturbance.  Respiratory: Negative for cough and chest tightness.   Gastrointestinal: Negative for abdominal pain and nausea.  Genitourinary: Negative for difficulty urinating, frequency and vaginal pain.  Musculoskeletal: Negative for back pain and gait problem.  Skin: Negative for pallor and rash.  Neurological: Negative for dizziness, tremors, weakness, numbness and headaches.  Psychiatric/Behavioral: Negative for confusion and sleep disturbance.    Objective:  BP  118/68 (BP Location: Left Arm)   Pulse 93   Temp 98.1 F (36.7 C) (Oral)   Ht 5' (1.524 m)   Wt 200 lb 9.6 oz (91 kg)   SpO2 97%   BMI 39.18 kg/m   BP Readings from Last 3 Encounters:  03/25/21 118/68  02/02/21 112/80  01/30/21 (!) 110/59    Wt Readings from Last 3 Encounters:  03/25/21 200 lb 9.6 oz (91 kg)  02/02/21 202 lb 6.4 oz (91.8 kg)  05/26/20 198 lb 6.4 oz (90 kg)    Physical Exam Constitutional:      General: She is not in acute distress.    Appearance: She is well-developed. She is obese.  HENT:     Head: Normocephalic.     Right Ear: External ear normal.     Left Ear: External ear normal.     Nose: Nose normal.  Eyes:     General:        Right eye: No discharge.        Left eye: No discharge.     Conjunctiva/sclera: Conjunctivae normal.     Pupils: Pupils are equal, round, and reactive to light.  Neck:     Thyroid: No thyromegaly.     Vascular: No JVD.     Trachea: No tracheal deviation.  Cardiovascular:     Rate and Rhythm: Normal rate and regular rhythm.     Heart sounds: Normal heart sounds.  Pulmonary:     Effort: No respiratory distress.     Breath sounds: No stridor. No wheezing.  Abdominal:     General: Bowel sounds are normal. There is no distension.     Palpations: Abdomen is soft. There is no mass.     Tenderness: There is no abdominal tenderness. There is no guarding or rebound.  Musculoskeletal:        General: No tenderness.     Cervical back: Normal range of motion and neck supple.  Lymphadenopathy:     Cervical: No cervical adenopathy.  Skin:    Findings: No erythema or rash.  Neurological:     Cranial Nerves: No cranial nerve deficit.     Motor: No abnormal muscle tone.     Coordination: Coordination normal.     Deep Tendon Reflexes: Reflexes normal.  Psychiatric:        Behavior: Behavior normal.        Thought Content: Thought content normal.        Judgment: Judgment normal.     Lab Results  Component Value Date    WBC 9.5 01/30/2021   HGB 13.2 01/30/2021   HCT 39.5 01/30/2021   PLT 326 01/30/2021   GLUCOSE 101 (H) 01/30/2021   CHOL 136 03/31/2020   TRIG 58.0 03/31/2020   HDL 46.70 03/31/2020   LDLCALC 78 03/31/2020   ALT 11 03/31/2020   AST 15 03/31/2020   NA 135 01/30/2021   K 3.6 01/30/2021   CL 102 01/30/2021   CREATININE 0.74 01/30/2021   BUN 15 01/30/2021   CO2 24 01/30/2021   TSH 1.81 03/31/2020   HGBA1C 5.5 03/31/2020    DG Chest 2 View  Result Date: 01/30/2021 CLINICAL DATA:  Chest pain EXAM: CHEST - 2 VIEW COMPARISON:  01/07/2019 FINDINGS: The heart size and mediastinal contours are within normal limits. Both lungs are clear. The visualized skeletal structures are unremarkable. IMPRESSION: No acute abnormality of the lungs. Electronically Signed   By: Lauralyn Primes M.D.   On: 01/30/2021 15:47    Assessment & Plan:   Is  Follow-up: No follow-ups on file.  Sonda Primes, MD

## 2021-03-25 NOTE — Assessment & Plan Note (Signed)
Iron, CBC

## 2021-03-25 NOTE — Assessment & Plan Note (Signed)
Check TSH, Vit D

## 2021-03-25 NOTE — Patient Instructions (Signed)
°These suggestions will probably help you to improve your metabolism if you are not overweight and to lose weight if you are overweight: °1.  Reduce your consumption of sugars and starches.  Eliminate high fructose corn syrup from your diet.  Reduce your consumption of processed foods.  For desserts try to have seasonal fruits, berries, nuts, cheeses or dark chocolate with more than 70% cacao. °2.  Do not snack °3.  You do not have to eat breakfast.  If you choose to have breakfast - eat plain greek yogurt, eggs, oatmeal (without sugar) - use honey if you need to. °4.  Drink water, freshly brewed unsweetened tea (green, black or herbal) or coffee.  Do not drink sodas including diet sodas , juices, beverages sweetened with artificial sweeteners. °5.  Reduce your consumption of refined grains. °6.  Avoid protein drinks such as Optifast, Slim fast etc. Eat chicken, fish, meat, dairy and beans for your sources of protein. °7.  Natural unprocessed fats like cold pressed virgin olive oil, butter, coconut oil are good for you.  Eat avocados. °8.  Increase your consumption of fiber.  Fruits, berries, vegetables, whole grains, flaxseed, chia seeds, beans, popcorn, nuts, oatmeal are good sources of fiber °9.  Use vinegar in your diet, i.e. apple cider vinegar, red wine or balsamic vinegar °10.  You can try fasting.  For example you can skip breakfast and lunch every other day (24-hour fast) °11.  Stress reduction, good night sleep, relaxation, meditation, yoga and other physical activity is likely to help you to maintain low weight too. °12.  If you drink alcohol, limit your alcohol intake to no more than 2 drinks a day. ° ° °Mediterranean diet is good for you. ([employer] has a typical Mediterranean cuisine menu) °The Mediterranean diet is a way of eating based on the traditional cuisine of countries bordering the [place] Sea. While there is no single definition of the Mediterranean diet, it is typically high in  vegetables, fruits, whole grains, beans, nut and seeds, and olive oil. °The main components of Mediterranean diet include: °• Daily consumption of vegetables, fruits, whole grains and healthy fats  °• Weekly intake of fish, poultry, beans and eggs  °• Moderate portions of dairy products  °• Limited intake of red meat °Other important elements of the Mediterranean diet are sharing meals with family and friends, enjoying a glass of red wine and being physically active. °Health benefits of a Mediterranean diet: °A traditional Mediterranean diet consisting of large quantities of fresh fruits and vegetables, nuts, fish and olive oil--coupled with physical activity--can reduce your risk of serious mental and physical health problems by: °Preventing heart disease and strokes. Following a Mediterranean diet limits your intake of refined breads, processed foods, and red meat, and encourages drinking red wine instead of hard liquor--all factors that can help prevent heart disease and stroke. °Keeping you agile. If you’re an older adult, the nutrients gained with a Mediterranean diet may reduce your risk of developing muscle weakness and other signs of frailty by about 70 percent. °Reducing the risk of Alzheimer’s. Research suggests that the Mediterranean diet may improve cholesterol, blood sugar levels, and overall blood vessel health, which in turn may reduce your risk of Alzheimer’s disease or dementia. °Halving the risk of Parkinson’s disease. The high levels of antioxidants in the Mediterranean diet can prevent cells from undergoing a damaging process called oxidative stress, thereby cutting the risk of Parkinson’s disease in half. °Increasing longevity. By reducing your risk of   developing heart disease or cancer with the Mediterranean diet, you’re reducing your risk of death at any age by 20%. °Protecting against type 2 diabetes. A Mediterranean diet is rich in fiber which digests slowly, prevents huge swings in blood  sugar, and can help you maintain a healthy weight. ° ° ° °Cabbage soup recipe that will not make you gain weight: °Take 1 small head of cabbage, 1 average pack of celery, 4 green peppers, 4 onions, 2 cans diced tomatoes (they are not available without salt), salt and spices to taste.  Chop cabbage, celery, peppers and onions.  And tomatoes and 2-2.5 liters (2.5 quarts) of water so that it would just cover the vegetables.  Bring to boil.  Add spices and salt.  Turn heat to low/medium and simmer for 20-25 minutes.  Naturally, you can make a smaller batch and change some of the ingredients. ° °

## 2021-03-26 ENCOUNTER — Other Ambulatory Visit: Payer: Self-pay | Admitting: Internal Medicine

## 2021-03-26 LAB — CBC WITH DIFFERENTIAL/PLATELET
Basophils Absolute: 0.1 10*3/uL (ref 0.0–0.1)
Basophils Relative: 1.3 % (ref 0.0–3.0)
Eosinophils Absolute: 0.5 10*3/uL (ref 0.0–0.7)
Eosinophils Relative: 7.7 % — ABNORMAL HIGH (ref 0.0–5.0)
HCT: 34.9 % — ABNORMAL LOW (ref 36.0–46.0)
Hemoglobin: 11.5 g/dL — ABNORMAL LOW (ref 12.0–15.0)
Lymphocytes Relative: 29.8 % (ref 12.0–46.0)
Lymphs Abs: 2 10*3/uL (ref 0.7–4.0)
MCHC: 32.9 g/dL (ref 30.0–36.0)
MCV: 84.8 fl (ref 78.0–100.0)
Monocytes Absolute: 1 10*3/uL (ref 0.1–1.0)
Monocytes Relative: 14.8 % — ABNORMAL HIGH (ref 3.0–12.0)
Neutro Abs: 3.1 10*3/uL (ref 1.4–7.7)
Neutrophils Relative %: 46.4 % (ref 43.0–77.0)
Platelets: 310 10*3/uL (ref 150.0–400.0)
RBC: 4.11 Mil/uL (ref 3.87–5.11)
RDW: 13.3 % (ref 11.5–15.5)
WBC: 6.7 10*3/uL (ref 4.0–10.5)

## 2021-03-26 LAB — TSH: TSH: 2.77 u[IU]/mL (ref 0.35–4.50)

## 2021-03-26 LAB — URINALYSIS
Bilirubin Urine: NEGATIVE
Hgb urine dipstick: NEGATIVE
Ketones, ur: NEGATIVE
Leukocytes,Ua: NEGATIVE
Nitrite: NEGATIVE
Specific Gravity, Urine: 1.015 (ref 1.000–1.030)
Total Protein, Urine: NEGATIVE
Urine Glucose: NEGATIVE
Urobilinogen, UA: 0.2 (ref 0.0–1.0)
pH: 7 (ref 5.0–8.0)

## 2021-03-26 LAB — COMPREHENSIVE METABOLIC PANEL
ALT: 9 U/L (ref 0–35)
AST: 13 U/L (ref 0–37)
Albumin: 4.1 g/dL (ref 3.5–5.2)
Alkaline Phosphatase: 59 U/L (ref 39–117)
BUN: 7 mg/dL (ref 6–23)
CO2: 26 mEq/L (ref 19–32)
Calcium: 9.1 mg/dL (ref 8.4–10.5)
Chloride: 103 mEq/L (ref 96–112)
Creatinine, Ser: 0.67 mg/dL (ref 0.40–1.20)
GFR: 114.6 mL/min (ref >60.00)
Glucose, Bld: 77 mg/dL (ref 70–99)
Potassium: 4 mEq/L (ref 3.5–5.1)
Sodium: 137 mEq/L (ref 135–145)
Total Bilirubin: 0.4 mg/dL (ref 0.2–1.2)
Total Protein: 7.2 g/dL (ref 6.0–8.3)

## 2021-03-26 LAB — VITAMIN D 25 HYDROXY (VIT D DEFICIENCY, FRACTURES): VITD: 15.42 ng/mL — ABNORMAL LOW (ref 30.00–100.00)

## 2021-03-26 LAB — IRON,TIBC AND FERRITIN PANEL
%SAT: 8 % (calc) — ABNORMAL LOW (ref 16–45)
Ferritin: 67 ng/mL (ref 16–154)
Iron: 26 ug/dL — ABNORMAL LOW (ref 40–190)
TIBC: 322 mcg/dL (calc) (ref 250–450)

## 2021-03-26 LAB — VITAMIN B12: Vitamin B-12: 408 pg/mL (ref 211–911)

## 2021-03-26 MED ORDER — VITAMIN D (ERGOCALCIFEROL) 1.25 MG (50000 UNIT) PO CAPS: 50000.0000 [IU] | ORAL_CAPSULE | ORAL | 0 refills | Status: DC

## 2021-03-26 MED ORDER — FERROUS SULFATE 325 (65 FE) MG PO TABS: 325.0000 mg | ORAL_TABLET | Freq: Every day | ORAL | 6 refills | Status: DC

## 2021-11-07 DIAGNOSIS — Z419 Encounter for procedure for purposes other than remedying health state, unspecified: Secondary | ICD-10-CM | POA: Diagnosis not present

## 2021-11-24 ENCOUNTER — Encounter: Payer: Self-pay | Admitting: Family Medicine

## 2021-11-24 ENCOUNTER — Other Ambulatory Visit: Payer: Self-pay

## 2021-11-24 ENCOUNTER — Other Ambulatory Visit (HOSPITAL_COMMUNITY)
Admission: RE | Admit: 2021-11-24 | Discharge: 2021-11-24 | Disposition: A | Discharge status: Home / Self Care | Payer: 59 | Source: Ambulatory Visit | Attending: Obstetrics | Admitting: Obstetrics

## 2021-11-24 ENCOUNTER — Ambulatory Visit (INDEPENDENT_AMBULATORY_CARE_PROVIDER_SITE_OTHER): Payer: 59 | Admitting: Family Medicine

## 2021-11-24 VITALS — BP 138/80 | HR 101 | Ht 60.0 in | Wt 195.0 lb

## 2021-11-24 DIAGNOSIS — Z01419 Encounter for gynecological examination (general) (routine) without abnormal findings: Secondary | ICD-10-CM | POA: Insufficient documentation

## 2021-11-24 DIAGNOSIS — N6324 Unspecified lump in the left breast, lower inner quadrant: Secondary | ICD-10-CM | POA: Diagnosis not present

## 2021-11-24 DIAGNOSIS — Z01411 Encounter for gynecological examination (general) (routine) with abnormal findings: Secondary | ICD-10-CM | POA: Diagnosis not present

## 2021-11-24 DIAGNOSIS — Z124 Encounter for screening for malignant neoplasm of cervix: Secondary | ICD-10-CM | POA: Diagnosis not present

## 2021-11-24 NOTE — Progress Notes (Signed)
GYN presents for AEX/PAP/STD screening.  C/o Left breast lump.
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
## 2021-11-25 LAB — CERVICOVAGINAL ANCILLARY ONLY
Chlamydia: NEGATIVE
Comment: NEGATIVE
Comment: NEGATIVE
Comment: NORMAL
Neisseria Gonorrhea: NEGATIVE
Trichomonas: NEGATIVE

## 2021-11-26 ENCOUNTER — Ambulatory Visit: Payer: 59 | Admitting: Obstetrics

## 2021-11-29 ENCOUNTER — Encounter: Payer: 59 | Admitting: Internal Medicine

## 2021-11-29 LAB — CYTOLOGY - PAP
Adequacy: ABSENT
Comment: NEGATIVE
Diagnosis: NEGATIVE
High risk HPV: NEGATIVE

## 2021-12-08 DIAGNOSIS — Z419 Encounter for procedure for purposes other than remedying health state, unspecified: Secondary | ICD-10-CM | POA: Diagnosis not present

## 2021-12-09 ENCOUNTER — Encounter: Payer: 59 | Admitting: Internal Medicine

## 2021-12-14 ENCOUNTER — Other Ambulatory Visit: Payer: Self-pay

## 2021-12-14 ENCOUNTER — Ambulatory Visit
Admission: RE | Admit: 2021-12-14 | Discharge: 2021-12-14 | Disposition: A | Discharge status: Home / Self Care | Payer: 59 | Source: Ambulatory Visit | Attending: Family Medicine | Admitting: Family Medicine

## 2021-12-14 DIAGNOSIS — N6324 Unspecified lump in the left breast, lower inner quadrant: Secondary | ICD-10-CM

## 2021-12-17 ENCOUNTER — Encounter: Payer: 59 | Admitting: Obstetrics

## 2021-12-17 ENCOUNTER — Ambulatory Visit (INDEPENDENT_AMBULATORY_CARE_PROVIDER_SITE_OTHER): Payer: 59 | Admitting: Internal Medicine

## 2021-12-17 ENCOUNTER — Other Ambulatory Visit: Payer: Self-pay

## 2021-12-17 ENCOUNTER — Encounter: Payer: Self-pay | Admitting: Internal Medicine

## 2021-12-17 VITALS — BP 122/80 | HR 86 | Temp 98.8°F | Resp 18 | Ht 60.0 in | Wt 192.2 lb

## 2021-12-17 DIAGNOSIS — Z0000 Encounter for general adult medical examination without abnormal findings: Secondary | ICD-10-CM | POA: Diagnosis not present

## 2021-12-17 DIAGNOSIS — D500 Iron deficiency anemia secondary to blood loss (chronic): Secondary | ICD-10-CM | POA: Diagnosis not present

## 2021-12-17 DIAGNOSIS — E559 Vitamin D deficiency, unspecified: Secondary | ICD-10-CM

## 2021-12-17 LAB — COMPREHENSIVE METABOLIC PANEL
ALT: 11 U/L (ref 0–35)
AST: 16 U/L (ref 0–37)
Albumin: 4 g/dL (ref 3.5–5.2)
Alkaline Phosphatase: 55 U/L (ref 39–117)
BUN: 11 mg/dL (ref 6–23)
CO2: 30 mEq/L (ref 19–32)
Calcium: 9.4 mg/dL (ref 8.4–10.5)
Chloride: 104 mEq/L (ref 96–112)
Creatinine, Ser: 0.72 mg/dL (ref 0.40–1.20)
GFR: 109.07 mL/min (ref >60.00)
Glucose, Bld: 88 mg/dL (ref 70–99)
Potassium: 3.4 mEq/L — ABNORMAL LOW (ref 3.5–5.1)
Sodium: 140 mEq/L (ref 135–145)
Total Bilirubin: 0.4 mg/dL (ref 0.2–1.2)
Total Protein: 7.3 g/dL (ref 6.0–8.3)

## 2021-12-17 LAB — URINALYSIS, ROUTINE W REFLEX MICROSCOPIC
Bilirubin Urine: NEGATIVE
Ketones, ur: 15 — AB
Leukocytes,Ua: NEGATIVE
Nitrite: NEGATIVE
RBC / HPF: NONE SEEN (ref 0–?)
Specific Gravity, Urine: >=1.03 — AB (ref 1.000–1.030)
Total Protein, Urine: NEGATIVE
Urine Glucose: NEGATIVE
Urobilinogen, UA: 0.2 (ref 0.0–1.0)
WBC, UA: NONE SEEN (ref 0–?)
pH: 6 (ref 5.0–8.0)

## 2021-12-17 LAB — CBC WITH DIFFERENTIAL/PLATELET
Basophils Absolute: 0.1 10*3/uL (ref 0.0–0.1)
Basophils Relative: 0.8 % (ref 0.0–3.0)
Eosinophils Absolute: 0.1 10*3/uL (ref 0.0–0.7)
Eosinophils Relative: 1.5 % (ref 0.0–5.0)
HCT: 33.4 % — ABNORMAL LOW (ref 36.0–46.0)
Hemoglobin: 10.7 g/dL — ABNORMAL LOW (ref 12.0–15.0)
Lymphocytes Relative: 39.2 % (ref 12.0–46.0)
Lymphs Abs: 2.8 10*3/uL (ref 0.7–4.0)
MCHC: 32.1 g/dL (ref 30.0–36.0)
MCV: 80.3 fl (ref 78.0–100.0)
Monocytes Absolute: 0.4 10*3/uL (ref 0.1–1.0)
Monocytes Relative: 6.3 % (ref 3.0–12.0)
Neutro Abs: 3.7 10*3/uL (ref 1.4–7.7)
Neutrophils Relative %: 52.2 % (ref 43.0–77.0)
Platelets: 390 10*3/uL (ref 150.0–400.0)
RBC: 4.16 Mil/uL (ref 3.87–5.11)
RDW: 13.9 % (ref 11.5–15.5)
WBC: 7.2 10*3/uL (ref 4.0–10.5)

## 2021-12-17 LAB — LIPID PANEL
Cholesterol: 143 mg/dL (ref 0–200)
HDL: 45.3 mg/dL (ref >39.00)
LDL Cholesterol: 90 mg/dL (ref 0–99)
NonHDL: 97.76
Total CHOL/HDL Ratio: 3
Triglycerides: 41 mg/dL (ref 0.0–149.0)
VLDL: 8.2 mg/dL (ref 0.0–40.0)

## 2021-12-17 LAB — VITAMIN D 25 HYDROXY (VIT D DEFICIENCY, FRACTURES): VITD: 15.31 ng/mL — ABNORMAL LOW (ref 30.00–100.00)

## 2021-12-17 LAB — VITAMIN B12: Vitamin B-12: 824 pg/mL (ref 211–911)

## 2021-12-17 LAB — TSH: TSH: 1.67 u[IU]/mL (ref 0.35–5.50)

## 2021-12-17 NOTE — Assessment & Plan Note (Signed)
  We discussed age appropriate health related issues, including available/recomended screening tests and vaccinations. Labs were ordered to be later reviewed . All questions were answered. We discussed one or more of the following - seat belt use, use of sunscreen/sun exposure exercise, safe sex, fall risk reduction, second hand smoke exposure, firearm use and storage, seat belt use, a need for adhering to healthy diet and exercise. Labs were ordered or discussed if they are available. All questions were answered.   

## 2021-12-17 NOTE — Patient Instructions (Signed)
The Obesity Code book by Jason Fung   These suggestions will probably help you to improve your metabolism if you are not overweight and to lose weight if you are overweight: 1.  Reduce your consumption of sugars and starches.  Eliminate high fructose corn syrup from your diet.  Reduce your consumption of processed foods.  For desserts try to have seasonal fruits, berries, nuts, cheeses or dark chocolate with more than 70% cacao. 2.  Do not snack 3.  You do not have to eat breakfast.  If you choose to have breakfast - eat plain greek yogurt, eggs, oatmeal (without sugar) - use honey if you need to. 4.  Drink water, freshly brewed unsweetened tea (green, black or herbal) or coffee.  Do not drink sodas including diet sodas , juices, beverages sweetened with artificial sweeteners. 5.  Reduce your consumption of refined grains. 6.  Avoid protein drinks such as Optifast, Slim fast etc. Eat chicken, fish, meat, dairy and beans for your sources of protein. 7.  Natural unprocessed fats like cold pressed virgin olive oil, butter, coconut oil are good for you.  Eat avocados. 8.  Increase your consumption of fiber.  Fruits, berries, vegetables, whole grains, flaxseed, chia seeds, beans, popcorn, nuts, oatmeal are good sources of fiber 9.  Use vinegar in your diet, i.e. apple cider vinegar, red wine or balsamic vinegar 10.  You can try fasting.  For example you can skip breakfast and lunch every other day (24-hour fast) 11.  Stress reduction, good night sleep, relaxation, meditation, yoga and other physical activity is likely to help you to maintain low weight too. 12.  If you drink alcohol, limit your alcohol intake to no more than 2 drinks a day. '''''''''''''''''''''''''''''''''''''''''''''''''''''''''''''''''''''''''''''''''''''''''''''''''''''''''''''''''''''''''''      

## 2021-12-18 LAB — IRON,TIBC AND FERRITIN PANEL
%SAT: 6 % (calc) — ABNORMAL LOW (ref 16–45)
Ferritin: 15 ng/mL — ABNORMAL LOW (ref 16–154)
Iron: 22 ug/dL — ABNORMAL LOW (ref 40–190)
TIBC: 349 mcg/dL (calc) (ref 250–450)

## 2021-12-19 ENCOUNTER — Other Ambulatory Visit: Payer: Self-pay | Admitting: Internal Medicine

## 2021-12-19 MED ORDER — VITAMIN D (ERGOCALCIFEROL) 1.25 MG (50000 UNIT) PO CAPS: 50000.0000 [IU] | ORAL_CAPSULE | ORAL | 0 refills | Status: DC

## 2021-12-20 ENCOUNTER — Ambulatory Visit (INDEPENDENT_AMBULATORY_CARE_PROVIDER_SITE_OTHER): Payer: 59 | Admitting: Internal Medicine

## 2021-12-20 ENCOUNTER — Other Ambulatory Visit: Payer: Self-pay

## 2021-12-20 ENCOUNTER — Encounter: Payer: Self-pay | Admitting: Internal Medicine

## 2021-12-20 DIAGNOSIS — J029 Acute pharyngitis, unspecified: Secondary | ICD-10-CM

## 2021-12-20 MED ORDER — AZITHROMYCIN 250 MG PO TABS: ORAL_TABLET | ORAL | 0 refills | Status: DC

## 2021-12-20 NOTE — Patient Instructions (Signed)
Likely herpangina Use Nyquil, Dayquil as needed Take Zpack if worse

## 2021-12-20 NOTE — Progress Notes (Signed)
Subjective:  Patient ID: [name], female    DOB: 06-30-1986  Age: 36 y.o. MRN: [id number]  CC: No chief complaint on file.   HPI SKII KLAWITER presents for ST x 2 days  - not feeling well  Outpatient Medications Prior to Visit  Medication Sig Dispense Refill   Cholecalciferol (VITAMIN D3) 50 MCG (2000 UT) capsule Take 1 capsule (2,000 Units total) by mouth daily. 100 capsule 3   etonogestrel-ethinyl estradiol (NUVARING) 0.12-0.015 MG/24HR vaginal ring Insert vaginally and leave in place for 3 consecutive weeks, then remove for 1 week. 1 each 12   ferrous sulfate 325 (65 FE) MG tablet Take 1 tablet (325 mg total) by mouth daily. 30 tablet 6   Olopatadine HCl 0.2 % SOLN Apply 1 drop to eye 2 (two) times daily. 2.5 mL 0   Vitamin D, Ergocalciferol, (DRISDOL) 1.25 MG (50000 UNIT) CAPS capsule Take 1 capsule (50,000 Units total) by mouth every 7 (seven) days. 8 capsule 0   No facility-administered medications prior to visit.    ROS: Review of Systems  Constitutional:  Positive for chills and fever. Negative for activity change, appetite change, fatigue and unexpected weight change.  HENT:  Positive for sore throat, trouble swallowing and voice change. Negative for congestion, mouth sores, rhinorrhea, sinus pressure and sinus pain.   Eyes:  Negative for visual disturbance.  Respiratory:  Negative for cough and chest tightness.   Gastrointestinal:  Negative for abdominal pain and nausea.  Genitourinary:  Negative for difficulty urinating, frequency and vaginal pain.  Musculoskeletal:  Negative for back pain and gait problem.  Skin:  Negative for pallor and rash.  Neurological:  Negative for dizziness, tremors, weakness, numbness and headaches.  Psychiatric/Behavioral:  Negative for confusion and sleep disturbance.    Objective:  BP 118/80 (BP Location: Left Arm, Patient Position: Sitting, Cuff Size: Large)    Pulse 79    Temp 99.4 F (37.4 C) (Oral)    Ht 5' (1.524 m)    Wt 195 lb  (88.5 kg)    LMP 12/13/2021 (Exact Date)    SpO2 98%    BMI 38.08 kg/m   BP Readings from Last 3 Encounters:  12/20/21 118/80  12/17/21 122/80  11/24/21 138/80    Wt Readings from Last 3 Encounters:  12/20/21 195 lb (88.5 kg)  12/17/21 192 lb 3.2 oz (87.2 kg)  11/24/21 195 lb (88.5 kg)    Physical Exam Constitutional:      General: She is not in acute distress.    Appearance: She is well-developed.  HENT:     Head: Normocephalic.     Right Ear: External ear normal.     Left Ear: External ear normal.     Nose: Nose normal.     Mouth/Throat:     Pharynx: Posterior oropharyngeal erythema present. No oropharyngeal exudate.  Eyes:     General:        Right eye: No discharge.        Left eye: No discharge.     Conjunctiva/sclera: Conjunctivae normal.     Pupils: Pupils are equal, round, and reactive to light.  Neck:     Thyroid: No thyromegaly.     Vascular: No JVD.     Trachea: No tracheal deviation.  Cardiovascular:     Rate and Rhythm: Normal rate and regular rhythm.     Heart sounds: Normal heart sounds.  Pulmonary:     Effort: No respiratory distress.  Breath sounds: No stridor. No wheezing.  Abdominal:     General: Bowel sounds are normal. There is no distension.     Palpations: Abdomen is soft. There is no mass.     Tenderness: There is no abdominal tenderness. There is no guarding or rebound.  Musculoskeletal:        General: No tenderness.     Cervical back: Normal range of motion and neck supple. No rigidity.  Lymphadenopathy:     Cervical: No cervical adenopathy.  Skin:    Findings: No erythema or rash.  Neurological:     Cranial Nerves: No cranial nerve deficit.     Motor: No abnormal muscle tone.     Coordination: Coordination normal.     Deep Tendon Reflexes: Reflexes normal.  Psychiatric:        Behavior: Behavior normal.        Thought Content: Thought content normal.        Judgment: Judgment normal.  Eryth throat; swollen long thin uvula  w/a blister at the tip  Lab Results  Component Value Date   WBC 7.2 12/17/2021   HGB 10.7 (L) 12/17/2021   HCT 33.4 (L) 12/17/2021   PLT 390.0 12/17/2021   GLUCOSE 88 12/17/2021   CHOL 143 12/17/2021   TRIG 41.0 12/17/2021   HDL 45.30 12/17/2021   LDLCALC 90 12/17/2021   ALT 11 12/17/2021   AST 16 12/17/2021   NA 140 12/17/2021   K 3.4 (L) 12/17/2021   CL 104 12/17/2021   CREATININE 0.72 12/17/2021   BUN 11 12/17/2021   CO2 30 12/17/2021   TSH 1.67 12/17/2021   HGBA1C 5.5 03/31/2020    US BREAST LTD UNI LEFT INC AXILLA  Addendum Date: 12/16/2021   ADDENDUM REPORT: 12/16/2021 10:04 ADDENDUM: Correction to the original report. The patient had no prior imaging for comparison. Electronically Signed   By: Ammie Ferrier M.D.   On: 12/16/2021 10:04   Result Date: 12/16/2021 CLINICAL DATA:  36 year old female presenting for evaluation of a palpable lump in the left breast. EXAM: DIGITAL DIAGNOSTIC BILATERAL MAMMOGRAM WITH TOMOSYNTHESIS AND CAD; ULTRASOUND LEFT BREAST LIMITED TECHNIQUE: Bilateral digital diagnostic mammography and breast tomosynthesis was performed. The images were evaluated with computer-aided detection.; Targeted ultrasound examination of the left breast was performed. COMPARISON:  Previous exam(s). ACR Breast Density Category b: There are scattered areas of fibroglandular density. FINDINGS: No suspicious calcifications, masses or areas of distortion are seen in the bilateral breasts. Ultrasound targeted to the region of the palpable lump in the left breast at 9 o'clock, 3 cm from the nipple demonstrates normal fibroglandular tissue. No suspicious masses or areas of shadowing are identified. IMPRESSION: 1. There are no suspicious mammographic or targeted sonographic abnormalities at the palpable site of concern in the left breast. 2.  No mammographic evidence of malignancy in the bilateral breasts. RECOMMENDATION: 1. Clinical follow-up recommended for the palpable area of  concern in the left breast. Any further workup should be based on clinical grounds. 2. Screening mammogram at age 67 unless there are persistent or intervening clinical concerns. (Code:SM-B-40A) I have discussed the findings and recommendations with the patient. If applicable, a reminder letter will be sent to the patient regarding the next appointment. BI-RADS CATEGORY  1: Negative. Electronically Signed: By: Ammie Ferrier M.D. On: 12/14/2021 15:16  MM DIAG BREAST TOMO BILATERAL  Addendum Date: 12/16/2021   ADDENDUM REPORT: 12/16/2021 10:04 ADDENDUM: Correction to the original report. The patient had no prior imaging for comparison.  Electronically Signed   By: Ammie Ferrier M.D.   On: 12/16/2021 10:04   Result Date: 12/16/2021 CLINICAL DATA:  36 year old female presenting for evaluation of a palpable lump in the left breast. EXAM: DIGITAL DIAGNOSTIC BILATERAL MAMMOGRAM WITH TOMOSYNTHESIS AND CAD; ULTRASOUND LEFT BREAST LIMITED TECHNIQUE: Bilateral digital diagnostic mammography and breast tomosynthesis was performed. The images were evaluated with computer-aided detection.; Targeted ultrasound examination of the left breast was performed. COMPARISON:  Previous exam(s). ACR Breast Density Category b: There are scattered areas of fibroglandular density. FINDINGS: No suspicious calcifications, masses or areas of distortion are seen in the bilateral breasts. Ultrasound targeted to the region of the palpable lump in the left breast at 9 o'clock, 3 cm from the nipple demonstrates normal fibroglandular tissue. No suspicious masses or areas of shadowing are identified. IMPRESSION: 1. There are no suspicious mammographic or targeted sonographic abnormalities at the palpable site of concern in the left breast. 2.  No mammographic evidence of malignancy in the bilateral breasts. RECOMMENDATION: 1. Clinical follow-up recommended for the palpable area of concern in the left breast. Any further workup should be based  on clinical grounds. 2. Screening mammogram at age 37 unless there are persistent or intervening clinical concerns. (Code:SM-B-40A) I have discussed the findings and recommendations with the patient. If applicable, a reminder letter will be sent to the patient regarding the next appointment. BI-RADS CATEGORY  1: Negative. Electronically Signed: By: Ammie Ferrier M.D. On: 12/14/2021 15:16   Assessment & Plan:   Problem List Items Addressed This Visit     Pharyngitis    Likely herpangina: Eryth throat; swollen long thin uvula w/a blister at the tip Strep test Use Nyquil, Dayquil prn Zpack if worse         No orders of the defined types were placed in this encounter.     Follow-up: No follow-ups on file.  Walker Kehr, MD

## 2021-12-20 NOTE — Assessment & Plan Note (Addendum)
Likely herpangina: Eryth throat; swollen long thin uvula w/a blister at the tip Strep test Use Nyquil, Dayquil prn Zpack if worse

## 2021-12-27 ENCOUNTER — Encounter: Payer: Self-pay | Admitting: Internal Medicine

## 2021-12-28 ENCOUNTER — Other Ambulatory Visit: Payer: 59

## 2021-12-30 NOTE — Progress Notes (Signed)
CC: Annual Exam (Acid reflux has been bothering her lately. She has concerns that her vitamin d and iron levels may be low. She is also having migraines but she thinks it may be due to her looking at a computer for work. )     HPI [name] presents for a well exam         Outpatient Medications Prior to Visit  Medication Sig Dispense Refill   Cholecalciferol (VITAMIN D3) 50 MCG (2000 UT) capsule Take 1 capsule (2,000 Units total) by mouth daily. 100 capsule 3   etonogestrel-ethinyl estradiol (NUVARING) 0.12-0.015 MG/24HR vaginal ring Insert vaginally and leave in place for 3 consecutive weeks, then remove for 1 week. 1 each 12   ferrous sulfate 325 (65 FE) MG tablet Take 1 tablet (325 mg total) by mouth daily. 30 tablet 6   Olopatadine HCl 0.2 % SOLN Apply 1 drop to eye 2 (two) times daily. 2.5 mL 0   Vitamin D, Ergocalciferol, (DRISDOL) 1.25 MG (50000 UNIT) CAPS capsule Take 1 capsule (50,000 Units total) by mouth every 7 (seven) days. 8 capsule 0   albuterol (VENTOLIN HFA) 108 (90 Base) MCG/ACT inhaler Inhale 2 puffs into the lungs every 6 (six) hours as needed for wheezing or shortness of breath. 8 g 0    No facility-administered medications prior to visit.      ROS: Review of Systems   Objective:  BP 122/80    Pulse 86    Temp 98.8 F (37.1 C) (Oral)    Resp 18    Ht 5' (1.524 m)    Wt 192 lb 3.2 oz (87.2 kg)    LMP 12/13/2021 (Exact Date)    SpO2 98%    BMI 37.54 kg/m       BP Readings from Last 3 Encounters:  12/17/21 122/80  11/24/21 138/80  03/25/21 118/68         Wt Readings from Last 3 Encounters:  12/17/21 192 lb 3.2 oz (87.2 kg)  11/24/21 195 lb (88.5 kg)  03/25/21 200 lb 9.6 oz (91 kg)      Physical Exam   Recent Labs       Lab Results  Component Value Date    WBC 6.7 03/25/2021    HGB 11.5 (L) 03/25/2021    HCT 34.9 (L) 03/25/2021    PLT 310.0 03/25/2021    GLUCOSE 77 03/25/2021    CHOL 136 03/31/2020    TRIG 58.0 03/31/2020    HDL 46.70  03/31/2020    LDLCALC 78 03/31/2020    ALT 9 03/25/2021    AST 13 03/25/2021    NA 137 03/25/2021    K 4.0 03/25/2021    CL 103 03/25/2021    CREATININE 0.67 03/25/2021    BUN 7 03/25/2021    CO2 26 03/25/2021    TSH 2.77 03/25/2021    HGBA1C 5.5 03/31/2020        US BREAST LTD UNI LEFT INC AXILLA   Addendum Date: 12/16/2021   ADDENDUM REPORT: 12/16/2021 10:04 ADDENDUM: Correction to the original report. The patient had no prior imaging for comparison. Electronically Signed   By: Ammie Ferrier M.D.   On: 12/16/2021 10:04    Result Date: 12/16/2021 CLINICAL DATA:  36 year old female presenting for evaluation of a palpable lump in the left breast. EXAM: DIGITAL DIAGNOSTIC BILATERAL MAMMOGRAM WITH TOMOSYNTHESIS AND CAD; ULTRASOUND LEFT BREAST LIMITED TECHNIQUE: Bilateral digital diagnostic mammography and breast tomosynthesis was performed. The images were evaluated with  computer-aided detection.; Targeted ultrasound examination of the left breast was performed. COMPARISON:  Previous exam(s). ACR Breast Density Category b: There are scattered areas of fibroglandular density. FINDINGS: No suspicious calcifications, masses or areas of distortion are seen in the bilateral breasts. Ultrasound targeted to the region of the palpable lump in the left breast at 9 o'clock, 3 cm from the nipple demonstrates normal fibroglandular tissue. No suspicious masses or areas of shadowing are identified. IMPRESSION: 1. There are no suspicious mammographic or targeted sonographic abnormalities at the palpable site of concern in the left breast. 2.  No mammographic evidence of malignancy in the bilateral breasts. RECOMMENDATION: 1. Clinical follow-up recommended for the palpable area of concern in the left breast. Any further workup should be based on clinical grounds. 2. Screening mammogram at age 66 unless there are persistent or intervening clinical concerns. (Code:SM-B-40A) I have discussed the findings and  recommendations with the patient. If applicable, a reminder letter will be sent to the patient regarding the next appointment. BI-RADS CATEGORY  1: Negative. Electronically Signed: By: Ammie Ferrier M.D. On: 12/14/2021 15:16   MM DIAG BREAST TOMO BILATERAL   Addendum Date: 12/16/2021   ADDENDUM REPORT: 12/16/2021 10:04 ADDENDUM: Correction to the original report. The patient had no prior imaging for comparison. Electronically Signed   By: Ammie Ferrier M.D.   On: 12/16/2021 10:04    Result Date: 12/16/2021 CLINICAL DATA:  36 year old female presenting for evaluation of a palpable lump in the left breast. EXAM: DIGITAL DIAGNOSTIC BILATERAL MAMMOGRAM WITH TOMOSYNTHESIS AND CAD; ULTRASOUND LEFT BREAST LIMITED TECHNIQUE: Bilateral digital diagnostic mammography and breast tomosynthesis was performed. The images were evaluated with computer-aided detection.; Targeted ultrasound examination of the left breast was performed. COMPARISON:  Previous exam(s). ACR Breast Density Category b: There are scattered areas of fibroglandular density. FINDINGS: No suspicious calcifications, masses or areas of distortion are seen in the bilateral breasts. Ultrasound targeted to the region of the palpable lump in the left breast at 9 o'clock, 3 cm from the nipple demonstrates normal fibroglandular tissue. No suspicious masses or areas of shadowing are identified. IMPRESSION: 1. There are no suspicious mammographic or targeted sonographic abnormalities at the palpable site of concern in the left breast. 2.  No mammographic evidence of malignancy in the bilateral breasts. RECOMMENDATION: 1. Clinical follow-up recommended for the palpable area of concern in the left breast. Any further workup should be based on clinical grounds. 2. Screening mammogram at age 61 unless there are persistent or intervening clinical concerns. (Code:SM-B-40A) I have discussed the findings and recommendations with the patient. If applicable, a reminder  letter will be sent to the patient regarding the next appointment. BI-RADS CATEGORY  1: Negative. Electronically Signed: By: Ammie Ferrier M.D. On: 12/14/2021 15:16     Assessment & Plan:    Problem List Items Addressed This Visit       Well adult exam        We discussed age appropriate health related issues, including available/recomended screening tests and vaccinations. Labs were ordered to be later reviewed . All questions were answered. We discussed one or more of the following - seat belt use, use of sunscreen/sun exposure exercise, safe sex, fall risk reduction, second hand smoke exposure, firearm use and storage, seat belt use, a need for adhering to healthy diet and exercise. Labs were ordered or discussed if they are available. All questions were answered.            No orders of the defined  types were placed in this encounter.       Follow-up: No follow-ups on file.   Walker Kehr, MD

## 2022-01-05 DIAGNOSIS — Z419 Encounter for procedure for purposes other than remedying health state, unspecified: Secondary | ICD-10-CM | POA: Diagnosis not present

## 2022-02-05 DIAGNOSIS — Z419 Encounter for procedure for purposes other than remedying health state, unspecified: Secondary | ICD-10-CM | POA: Diagnosis not present

## 2022-02-15 ENCOUNTER — Encounter: Payer: Self-pay | Admitting: Internal Medicine

## 2022-02-15 ENCOUNTER — Ambulatory Visit (INDEPENDENT_AMBULATORY_CARE_PROVIDER_SITE_OTHER): Payer: 59 | Admitting: Internal Medicine

## 2022-02-15 DIAGNOSIS — D500 Iron deficiency anemia secondary to blood loss (chronic): Secondary | ICD-10-CM | POA: Diagnosis not present

## 2022-02-15 DIAGNOSIS — E559 Vitamin D deficiency, unspecified: Secondary | ICD-10-CM

## 2022-02-15 DIAGNOSIS — R5382 Chronic fatigue, unspecified: Secondary | ICD-10-CM | POA: Diagnosis not present

## 2022-02-15 LAB — CBC WITH DIFFERENTIAL/PLATELET
Basophils Absolute: 0 10*3/uL (ref 0.0–0.1)
Basophils Relative: 0.7 % (ref 0.0–3.0)
Eosinophils Absolute: 0.2 10*3/uL (ref 0.0–0.7)
Eosinophils Relative: 2.7 % (ref 0.0–5.0)
HCT: 32.6 % — ABNORMAL LOW (ref 36.0–46.0)
Hemoglobin: 10.5 g/dL — ABNORMAL LOW (ref 12.0–15.0)
Lymphocytes Relative: 34.4 % (ref 12.0–46.0)
Lymphs Abs: 2.4 10*3/uL (ref 0.7–4.0)
MCHC: 32.3 g/dL (ref 30.0–36.0)
MCV: 79.6 fl (ref 78.0–100.0)
Monocytes Absolute: 0.5 10*3/uL (ref 0.1–1.0)
Monocytes Relative: 7.5 % (ref 3.0–12.0)
Neutro Abs: 3.8 10*3/uL (ref 1.4–7.7)
Neutrophils Relative %: 54.7 % (ref 43.0–77.0)
Platelets: 336 10*3/uL (ref 150.0–400.0)
RBC: 4.09 Mil/uL (ref 3.87–5.11)
RDW: 15.6 % — ABNORMAL HIGH (ref 11.5–15.5)
WBC: 6.9 10*3/uL (ref 4.0–10.5)

## 2022-02-15 NOTE — Assessment & Plan Note (Signed)
Worse - ?due to anemia ?Hem ref - Dr Myna Hidalgo ?CBC, iron ?

## 2022-02-15 NOTE — Progress Notes (Signed)
? ?Subjective:  ?Patient ID: [name], female    DOB: February 19, 1986  Age: 36 y.o. MRN: [id number] ? ?CC: No chief complaint on file. ? ? ?HPI ?[name] presents for anemia, fatigue - worse ? ?Outpatient Medications Prior to Visit  ?Medication Sig Dispense Refill  ? Cholecalciferol (VITAMIN D3) 50 MCG (2000 UT) capsule Take 1 capsule (2,000 Units total) by mouth daily. 100 capsule 3  ? etonogestrel-ethinyl estradiol (NUVARING) 0.12-0.015 MG/24HR vaginal ring Insert vaginally and leave in place for 3 consecutive weeks, then remove for 1 week. 1 each 12  ? ferrous sulfate 325 (65 FE) MG tablet Take 1 tablet (325 mg total) by mouth daily. 30 tablet 6  ? Olopatadine HCl 0.2 % SOLN Apply 1 drop to eye 2 (two) times daily. 2.5 mL 0  ? azithromycin (ZITHROMAX Z-PAK) 250 MG tablet As directed 6 tablet 0  ? Vitamin D, Ergocalciferol, (DRISDOL) 1.25 MG (50000 UNIT) CAPS capsule Take 1 capsule (50,000 Units total) by mouth every 7 (seven) days. 8 capsule 0  ? ?No facility-administered medications prior to visit.  ? ? ?ROS: ?Review of Systems  ?Constitutional:  Positive for fatigue. Negative for activity change, appetite change, chills and unexpected weight change.  ?HENT:  Negative for congestion, mouth sores and sinus pressure.   ?Eyes:  Negative for visual disturbance.  ?Respiratory:  Negative for cough and chest tightness.   ?Gastrointestinal:  Negative for abdominal pain and nausea.  ?Genitourinary:  Negative for difficulty urinating, frequency and vaginal pain.  ?Musculoskeletal:  Negative for back pain and gait problem.  ?Skin:  Negative for pallor and rash.  ?Neurological:  Negative for dizziness, tremors, weakness, numbness and headaches.  ?Psychiatric/Behavioral:  Negative for confusion and sleep disturbance.   ? ?Objective:  ?BP 122/80 (BP Location: Left Arm, Patient Position: Sitting, Cuff Size: Large)   Pulse 80   Temp 97.7 ?F (36.5 ?C) (Oral)   Ht 5' (1.524 m)   Wt 194 lb (88 kg)   SpO2 99%   BMI 37.89  kg/m?  ? ?BP Readings from Last 3 Encounters:  ?02/15/22 122/80  ?12/20/21 118/80  ?12/17/21 122/80  ? ? ?Wt Readings from Last 3 Encounters:  ?02/15/22 194 lb (88 kg)  ?12/20/21 195 lb (88.5 kg)  ?12/17/21 192 lb 3.2 oz (87.2 kg)  ? ? ?Physical Exam ?Constitutional:   ?   General: She is not in acute distress. ?   Appearance: She is well-developed. She is obese.  ?HENT:  ?   Head: Normocephalic.  ?   Right Ear: External ear normal.  ?   Left Ear: External ear normal.  ?   Nose: Nose normal.  ?Eyes:  ?   General:     ?   Right eye: No discharge.     ?   Left eye: No discharge.  ?   Conjunctiva/sclera: Conjunctivae normal.  ?   Pupils: Pupils are equal, round, and reactive to light.  ?Neck:  ?   Thyroid: No thyromegaly.  ?   Vascular: No JVD.  ?   Trachea: No tracheal deviation.  ?Cardiovascular:  ?   Rate and Rhythm: Normal rate and regular rhythm.  ?   Heart sounds: Normal heart sounds.  ?Pulmonary:  ?   Effort: No respiratory distress.  ?   Breath sounds: No stridor. No wheezing.  ?Abdominal:  ?   General: Bowel sounds are normal. There is no distension.  ?   Palpations: Abdomen is soft. There is no mass.  ?  Tenderness: There is no abdominal tenderness. There is no guarding or rebound.  ?Musculoskeletal:     ?   General: No tenderness.  ?   Cervical back: Normal range of motion and neck supple. No rigidity.  ?Lymphadenopathy:  ?   Cervical: No cervical adenopathy.  ?Skin: ?   Findings: No erythema or rash.  ?Neurological:  ?   Cranial Nerves: No cranial nerve deficit.  ?   Motor: No abnormal muscle tone.  ?   Coordination: Coordination normal.  ?   Deep Tendon Reflexes: Reflexes normal.  ?Psychiatric:     ?   Behavior: Behavior normal.     ?   Thought Content: Thought content normal.     ?   Judgment: Judgment normal.  ?L 5th finger - dressed ? ?Lab Results  ?Component Value Date  ? WBC 7.2 12/17/2021  ? HGB 10.7 (L) 12/17/2021  ? HCT 33.4 (L) 12/17/2021  ? PLT 390.0 12/17/2021  ? GLUCOSE 88 12/17/2021  ? CHOL  143 12/17/2021  ? TRIG 41.0 12/17/2021  ? HDL 45.30 12/17/2021  ? LDLCALC 90 12/17/2021  ? ALT 11 12/17/2021  ? AST 16 12/17/2021  ? NA 140 12/17/2021  ? K 3.4 (L) 12/17/2021  ? CL 104 12/17/2021  ? CREATININE 0.72 12/17/2021  ? BUN 11 12/17/2021  ? CO2 30 12/17/2021  ? TSH 1.67 12/17/2021  ? HGBA1C 5.5 03/31/2020  ? ? ?US BREAST LTD UNI LEFT INC AXILLA ? ?Addendum Date: 12/16/2021   ?ADDENDUM REPORT: 12/16/2021 10:04 ADDENDUM: Correction to the original report. The patient had no prior imaging for comparison. Electronically Signed   By: Frederico Hamman M.D.   On: 12/16/2021 10:04  ? ?Result Date: 12/16/2021 ?CLINICAL DATA:  36 year old female presenting for evaluation of a palpable lump in the left breast. EXAM: DIGITAL DIAGNOSTIC BILATERAL MAMMOGRAM WITH TOMOSYNTHESIS AND CAD; ULTRASOUND LEFT BREAST LIMITED TECHNIQUE: Bilateral digital diagnostic mammography and breast tomosynthesis was performed. The images were evaluated with computer-aided detection.; Targeted ultrasound examination of the left breast was performed. COMPARISON:  Previous exam(s). ACR Breast Density Category b: There are scattered areas of fibroglandular density. FINDINGS: No suspicious calcifications, masses or areas of distortion are seen in the bilateral breasts. Ultrasound targeted to the region of the palpable lump in the left breast at 9 o'clock, 3 cm from the nipple demonstrates normal fibroglandular tissue. No suspicious masses or areas of shadowing are identified. IMPRESSION: 1. There are no suspicious mammographic or targeted sonographic abnormalities at the palpable site of concern in the left breast. 2.  No mammographic evidence of malignancy in the bilateral breasts. RECOMMENDATION: 1. Clinical follow-up recommended for the palpable area of concern in the left breast. Any further workup should be based on clinical grounds. 2. Screening mammogram at age 54 unless there are persistent or intervening clinical concerns. (Code:SM-B-40A) I  have discussed the findings and recommendations with the patient. If applicable, a reminder letter will be sent to the patient regarding the next appointment. BI-RADS CATEGORY  1: Negative. Electronically Signed: By: Frederico Hamman M.D. On: 12/14/2021 15:16 ? ?MM DIAG BREAST TOMO BILATERAL ? ?Addendum Date: 12/16/2021   ?ADDENDUM REPORT: 12/16/2021 10:04 ADDENDUM: Correction to the original report. The patient had no prior imaging for comparison. Electronically Signed   By: Frederico Hamman M.D.   On: 12/16/2021 10:04  ? ?Result Date: 12/16/2021 ?CLINICAL DATA:  36 year old female presenting for evaluation of a palpable lump in the left breast. EXAM: DIGITAL DIAGNOSTIC BILATERAL MAMMOGRAM WITH TOMOSYNTHESIS AND  CAD; ULTRASOUND LEFT BREAST LIMITED TECHNIQUE: Bilateral digital diagnostic mammography and breast tomosynthesis was performed. The images were evaluated with computer-aided detection.; Targeted ultrasound examination of the left breast was performed. COMPARISON:  Previous exam(s). ACR Breast Density Category b: There are scattered areas of fibroglandular density. FINDINGS: No suspicious calcifications, masses or areas of distortion are seen in the bilateral breasts. Ultrasound targeted to the region of the palpable lump in the left breast at 9 o'clock, 3 cm from the nipple demonstrates normal fibroglandular tissue. No suspicious masses or areas of shadowing are identified. IMPRESSION: 1. There are no suspicious mammographic or targeted sonographic abnormalities at the palpable site of concern in the left breast. 2.  No mammographic evidence of malignancy in the bilateral breasts. RECOMMENDATION: 1. Clinical follow-up recommended for the palpable area of concern in the left breast. Any further workup should be based on clinical grounds. 2. Screening mammogram at age 74 unless there are persistent or intervening clinical concerns. (Code:SM-B-40A) I have discussed the findings and recommendations with the  patient. If applicable, a reminder letter will be sent to the patient regarding the next appointment. BI-RADS CATEGORY  1: Negative. Electronically Signed: By: Frederico Hamman M.D. On: 12/14/2021 15:16 ? ? ?Ass

## 2022-02-15 NOTE — Assessment & Plan Note (Signed)
Probably worse ?Hem ref - Dr Myna Hidalgo ?CBC, iron ?

## 2022-02-15 NOTE — Assessment & Plan Note (Signed)
On Vit D 

## 2022-02-16 LAB — IRON,TIBC AND FERRITIN PANEL
%SAT: 12 % (calc) — ABNORMAL LOW (ref 16–45)
Ferritin: 8 ng/mL — ABNORMAL LOW (ref 16–154)
Iron: 48 ug/dL (ref 40–190)
TIBC: 386 mcg/dL (calc) (ref 250–450)

## 2022-02-18 ENCOUNTER — Encounter: Payer: Self-pay | Admitting: Internal Medicine

## 2022-02-18 NOTE — Telephone Encounter (Signed)
Tried calling pt no answer LMOM w/MD response. Also sent msg via mychart.[email or web address] [name] ?

## 2022-02-21 ENCOUNTER — Other Ambulatory Visit: Payer: Self-pay | Admitting: Family

## 2022-02-21 DIAGNOSIS — N920 Excessive and frequent menstruation with regular cycle: Secondary | ICD-10-CM

## 2022-02-21 DIAGNOSIS — D500 Iron deficiency anemia secondary to blood loss (chronic): Secondary | ICD-10-CM

## 2022-02-22 ENCOUNTER — Other Ambulatory Visit: Payer: Self-pay

## 2022-02-22 ENCOUNTER — Inpatient Hospital Stay: Payer: 59 | Attending: Family

## 2022-02-22 ENCOUNTER — Inpatient Hospital Stay (HOSPITAL_BASED_OUTPATIENT_CLINIC_OR_DEPARTMENT_OTHER): Payer: 59 | Admitting: Family

## 2022-02-22 ENCOUNTER — Encounter: Payer: Self-pay | Admitting: Family

## 2022-02-22 ENCOUNTER — Telehealth: Payer: Self-pay | Admitting: *Deleted

## 2022-02-22 VITALS — BP 123/75 | HR 71 | Temp 98.6°F | Resp 18 | Ht 60.0 in | Wt 192.1 lb

## 2022-02-22 DIAGNOSIS — N920 Excessive and frequent menstruation with regular cycle: Secondary | ICD-10-CM

## 2022-02-22 DIAGNOSIS — D500 Iron deficiency anemia secondary to blood loss (chronic): Secondary | ICD-10-CM | POA: Diagnosis present

## 2022-02-22 LAB — CBC WITH DIFFERENTIAL (CANCER CENTER ONLY)
Abs Immature Granulocytes: 0.02 10*3/uL (ref 0.00–0.07)
Basophils Absolute: 0.1 10*3/uL (ref 0.0–0.1)
Basophils Relative: 1 %
Eosinophils Absolute: 0.3 10*3/uL (ref 0.0–0.5)
Eosinophils Relative: 3 %
HCT: 33.1 % — ABNORMAL LOW (ref 36.0–46.0)
Hemoglobin: 10.3 g/dL — ABNORMAL LOW (ref 12.0–15.0)
Immature Granulocytes: 0 %
Lymphocytes Relative: 39 %
Lymphs Abs: 3.5 10*3/uL (ref 0.7–4.0)
MCH: 25.4 pg — ABNORMAL LOW (ref 26.0–34.0)
MCHC: 31.1 g/dL (ref 30.0–36.0)
MCV: 81.5 fL (ref 80.0–100.0)
Monocytes Absolute: 0.5 10*3/uL (ref 0.1–1.0)
Monocytes Relative: 6 %
Neutro Abs: 4.8 10*3/uL (ref 1.7–7.7)
Neutrophils Relative %: 51 %
Platelet Count: 311 10*3/uL (ref 150–400)
RBC: 4.06 MIL/uL (ref 3.87–5.11)
RDW: 14.8 % (ref 11.5–15.5)
WBC Count: 9.2 10*3/uL (ref 4.0–10.5)
nRBC: 0 % (ref 0.0–0.2)

## 2022-02-22 LAB — RETICULOCYTES
Immature Retic Fract: 19.1 % — ABNORMAL HIGH (ref 2.3–15.9)
RBC.: 4.03 MIL/uL (ref 3.87–5.11)
Retic Count, Absolute: 81 10*3/uL (ref 19.0–186.0)
Retic Ct Pct: 2 % (ref 0.4–3.1)

## 2022-02-22 LAB — IRON AND IRON BINDING CAPACITY (CC-WL,HP ONLY)
Iron: 25 ug/dL — ABNORMAL LOW (ref 28–170)
Saturation Ratios: 6 % — ABNORMAL LOW (ref 10.4–31.8)
TIBC: 452 ug/dL — ABNORMAL HIGH (ref 250–450)
UIBC: 427 ug/dL (ref 148–442)

## 2022-02-22 LAB — FERRITIN: Ferritin: 6 ng/mL — ABNORMAL LOW (ref 11–307)

## 2022-02-22 NOTE — Telephone Encounter (Signed)
Per 02/22/22 los - gave upcoming appointments - confirmed ?

## 2022-02-22 NOTE — Progress Notes (Signed)
?Hematology and Oncology Follow Up Visit ? ?[name] ?[id number] ?Oct 13, 1986 36 y.o. ?02/22/2022 ? ? ?Principle Diagnosis:  ?Iron deficiency anemia secondary to chronic blood loss due to menorrhagia ?  ?Current Therapy:        ?IV iron as indicated  ?  ?Interim History:  [name] is here today for follow-up. We last saw her in July 2021. She is symptomatic with fatigue.  ?Her cycle remains regular with heavy flow. No other blood loss noted.  ?No bruising or petechiae.  ?No fever, chills, n/v, cough, rash, dizziness, SOB, chest pain, palpitations, abdominal pain or changes in bowel or bladder habits.  ?No swelling, tenderness, numbness or tingling in her extremities.  ?No falls or syncope reported.  ?She has maintained a good appetite and is doing her best to stay well hydrated. Her weight is stable at 192 lbs.  ? ?ECOG Performance Status: 1 - Symptomatic but completely ambulatory ? ?Medications:  ?Allergies as of 02/22/2022   ?No Known Allergies ?  ? ?  ?Medication List  ?  ? ?  ? Accurate as of February 22, 2022 10:57 AM. If you have any questions, ask your nurse or doctor.  ?  ?  ? ?  ? ?etonogestrel-ethinyl estradiol 0.12-0.015 MG/24HR vaginal ring ?Commonly known as: NUVARING ?Insert vaginally and leave in place for 3 consecutive weeks, then remove for 1 week. ?  ?ferrous sulfate 325 (65 FE) MG tablet ?Take 1 tablet (325 mg total) by mouth daily. ?  ?Olopatadine HCl 0.2 % Soln ?Apply 1 drop to eye 2 (two) times daily. ?  ?Vitamin D3 50 MCG (2000 UT) capsule ?Take 1 capsule (2,000 Units total) by mouth daily. ?  ? ?  ? ? ?Allergies: No Known Allergies ? ?Past Medical History, Surgical history, Social history, and Family History were reviewed and updated. ? ?Review of Systems: ?All other 10 point review of systems is negative.  ? ?Physical Exam: ? vitals were not taken for this visit.  ? ?Wt Readings from Last 3 Encounters:  ?02/15/22 194 lb (88 kg)  ?12/20/21 195 lb (88.5 kg)  ?12/17/21 192 lb 3.2 oz (87.2 kg)   ? ? ?Ocular: Sclerae unicteric, pupils equal, round and reactive to light ?Ear-nose-throat: Oropharynx clear, dentition fair ?Lymphatic: No cervical or supraclavicular adenopathy ?Lungs no rales or rhonchi, good excursion bilaterally ?Heart regular rate and rhythm, no murmur appreciated ?Abd soft, nontender, positive bowel sounds ?MSK no focal spinal tenderness, no joint edema ?Neuro: non-focal, well-oriented, appropriate affect ?Breasts: Deferred  ? ?Lab Results  ?Component Value Date  ? WBC 9.2 02/22/2022  ? HGB 10.3 (L) 02/22/2022  ? HCT 33.1 (L) 02/22/2022  ? MCV 81.5 02/22/2022  ? PLT 311 02/22/2022  ? ?Lab Results  ?Component Value Date  ? FERRITIN 8 (L) 02/15/2022  ? IRON 48 02/15/2022  ? TIBC 386 02/15/2022  ? UIBC 350 05/26/2020  ? IRONPCTSAT 12 (L) 02/15/2022  ? ?Lab Results  ?Component Value Date  ? RETICCTPCT 2.0 02/22/2022  ? RBC 4.03 02/22/2022  ? ?No results found for: KPAFRELGTCHN, LAMBDASER, KAPLAMBRATIO ?Lab Results  ?Component Value Date  ? IGA 310 11/16/2009  ? ?No results found for: TOTALPROTELP, ALBUMINELP, A1GS, A2GS, BETS, BETA2SER, GAMS, MSPIKE, SPEI ?  Chemistry   ?   ?Component Value Date/Time  ? NA 140 12/17/2021 1144  ? K 3.4 (L) 12/17/2021 1144  ? CL 104 12/17/2021 1144  ? CO2 30 12/17/2021 1144  ? BUN 11 12/17/2021 1144  ? CREATININE 0.72  12/17/2021 1144  ?    ?Component Value Date/Time  ? CALCIUM 9.4 12/17/2021 1144  ? ALKPHOS 55 12/17/2021 1144  ? AST 16 12/17/2021 1144  ? ALT 11 12/17/2021 1144  ? BILITOT 0.4 12/17/2021 1144  ?  ? ? ? ?Impression and Plan: [name] is a very pleasant 36 yo African American female with iron deficiency anemia secondary to heavy cycles.  ?We will get her set up for 3 doses of IV iron.  ?We will plan to see her again in 4 months.  ? ?Courtney Dawson, NP ?4/18/202310:57 AM ? ?

## 2022-02-25 ENCOUNTER — Inpatient Hospital Stay: Payer: 59

## 2022-02-25 VITALS — BP 113/66 | HR 80 | Temp 98.8°F | Resp 17

## 2022-02-25 DIAGNOSIS — N920 Excessive and frequent menstruation with regular cycle: Secondary | ICD-10-CM

## 2022-02-25 DIAGNOSIS — D500 Iron deficiency anemia secondary to blood loss (chronic): Secondary | ICD-10-CM | POA: Diagnosis not present

## 2022-02-25 MED ORDER — SODIUM CHLORIDE 0.9 % IV SOLN: Freq: Once | INTRAVENOUS | Status: AC

## 2022-02-25 MED ORDER — SODIUM CHLORIDE 0.9 % IV SOLN
300.0000 mg | Freq: Once | INTRAVENOUS | Status: AC
  Administered 2022-02-25: 300 mg via INTRAVENOUS
  Filled 2022-02-25: qty 300

## 2022-02-25 NOTE — Patient Instructions (Signed)

## 2022-02-25 NOTE — Progress Notes (Signed)
Pt declined to stay for post infusion observation period. Pt stated she has tolerated medication multiple times prior without difficulty.Pt aware this was a different type of iron than previous. Pt aware to seek emergent care with any reaction symptoms. Pt verbalized understanding. Pt aware to call clinic with any questions or concerns. Pt verbalized understanding and had no further questions.  ? ?

## 2022-03-04 ENCOUNTER — Ambulatory Visit: Payer: 59

## 2022-03-04 ENCOUNTER — Inpatient Hospital Stay: Payer: 59

## 2022-03-04 VITALS — BP 97/75 | HR 73 | Temp 98.9°F | Resp 17

## 2022-03-04 DIAGNOSIS — D500 Iron deficiency anemia secondary to blood loss (chronic): Secondary | ICD-10-CM | POA: Diagnosis not present

## 2022-03-04 DIAGNOSIS — N920 Excessive and frequent menstruation with regular cycle: Secondary | ICD-10-CM

## 2022-03-04 MED ORDER — SODIUM CHLORIDE 0.9 % IV SOLN
300.0000 mg | Freq: Once | INTRAVENOUS | Status: AC
  Administered 2022-03-04: 300 mg via INTRAVENOUS
  Filled 2022-03-04: qty 15

## 2022-03-04 MED ORDER — SODIUM CHLORIDE 0.9 % IV SOLN: Freq: Once | INTRAVENOUS | Status: AC

## 2022-03-04 NOTE — Progress Notes (Signed)
Pt declined to stay for post infusion observation period. Pt stated she has tolerated medication multiple times prior without difficulty. Pt aware to call clinic with any questions or concerns. Pt verbalized understanding and had no further questions.  ? ?

## 2022-03-07 DIAGNOSIS — Z419 Encounter for procedure for purposes other than remedying health state, unspecified: Secondary | ICD-10-CM | POA: Diagnosis not present

## 2022-03-09 ENCOUNTER — Ambulatory Visit: Payer: 59 | Admitting: Internal Medicine

## 2022-03-11 ENCOUNTER — Inpatient Hospital Stay: Payer: 59 | Attending: Family

## 2022-03-11 ENCOUNTER — Ambulatory Visit: Payer: 59

## 2022-03-11 VITALS — BP 122/72 | HR 82 | Temp 98.6°F | Resp 17

## 2022-03-11 DIAGNOSIS — N920 Excessive and frequent menstruation with regular cycle: Secondary | ICD-10-CM | POA: Diagnosis present

## 2022-03-11 DIAGNOSIS — D500 Iron deficiency anemia secondary to blood loss (chronic): Secondary | ICD-10-CM | POA: Insufficient documentation

## 2022-03-11 MED ORDER — SODIUM CHLORIDE 0.9 % IV SOLN: Freq: Once | INTRAVENOUS | Status: AC

## 2022-03-11 MED ORDER — SODIUM CHLORIDE 0.9 % IV SOLN
300.0000 mg | Freq: Once | INTRAVENOUS | Status: AC
  Administered 2022-03-11: 300 mg via INTRAVENOUS
  Filled 2022-03-11: qty 300

## 2022-03-11 NOTE — Progress Notes (Signed)
Patient refused to wait 30 minutes post infusion. Released stable and ASX. 

## 2022-03-11 NOTE — Patient Instructions (Signed)

## 2022-03-14 ENCOUNTER — Encounter: Payer: Self-pay | Admitting: Internal Medicine

## 2022-03-14 ENCOUNTER — Ambulatory Visit (INDEPENDENT_AMBULATORY_CARE_PROVIDER_SITE_OTHER): Payer: 59 | Admitting: Internal Medicine

## 2022-03-14 DIAGNOSIS — A422 Cervicofacial actinomycosis: Secondary | ICD-10-CM | POA: Diagnosis not present

## 2022-03-14 DIAGNOSIS — R5382 Chronic fatigue, unspecified: Secondary | ICD-10-CM

## 2022-03-14 DIAGNOSIS — R590 Localized enlarged lymph nodes: Secondary | ICD-10-CM | POA: Diagnosis not present

## 2022-03-14 DIAGNOSIS — D500 Iron deficiency anemia secondary to blood loss (chronic): Secondary | ICD-10-CM | POA: Diagnosis not present

## 2022-03-14 MED ORDER — CEPHALEXIN 500 MG PO CAPS: 500.0000 mg | ORAL_CAPSULE | Freq: Four times a day (QID) | ORAL | 0 refills | Status: DC

## 2022-03-14 NOTE — Assessment & Plan Note (Signed)
Better after anemia Rx - s/p 3 iron  infusions ?

## 2022-03-14 NOTE — Progress Notes (Signed)
? ?Subjective:  ?Patient ID: [name], female    DOB: 06/06/1986  Age: 36 y.o. MRN: [id number] ? ?CC: No chief complaint on file. ? ? ?HPI ?Ey[facility] D [name] presents for anemia - s/p 3 iron  infusions, fatigue ?C/o a lump over L lower jaw x 1-2 weeks ? ? ?Outpatient Medications Prior to Visit  ?Medication Sig Dispense Refill  ? Cholecalciferol (VITAMIN D3) 50 MCG (2000 UT) capsule Take 1 capsule (2,000 Units total) by mouth daily. 100 capsule 3  ? etonogestrel-ethinyl estradiol (NUVARING) 0.12-0.015 MG/24HR vaginal ring Insert vaginally and leave in place for 3 consecutive weeks, then remove for 1 week. 1 each 12  ? Olopatadine HCl 0.2 % SOLN Apply 1 drop to eye 2 (two) times daily. 2.5 mL 0  ? ?No facility-administered medications prior to visit.  ? ? ?ROS: ?Review of Systems  ?Constitutional:  Negative for activity change, appetite change, chills, fatigue and unexpected weight change.  ?HENT:  Negative for congestion, mouth sores and sinus pressure.   ?Eyes:  Negative for visual disturbance.  ?Respiratory:  Negative for cough and chest tightness.   ?Gastrointestinal:  Negative for abdominal pain and nausea.  ?Genitourinary:  Negative for difficulty urinating, frequency and vaginal pain.  ?Musculoskeletal:  Negative for back pain and gait problem.  ?Skin:  Negative for pallor and rash.  ?Neurological:  Negative for dizziness, tremors, weakness, numbness and headaches.  ?Psychiatric/Behavioral:  Negative for confusion and sleep disturbance.   ? ?Objective:  ?BP 118/72 (BP Location: Left Arm, Patient Position: Sitting, Cuff Size: Normal)   Pulse 88   Temp 98.8 ?F (37.1 ?C) (Oral)   Ht 5' (1.524 m)   Wt 192 lb (87.1 kg)   SpO2 92%   BMI 37.50 kg/m?  ? ?BP Readings from Last 3 Encounters:  ?03/14/22 118/72  ?03/11/22 122/72  ?03/04/22 97/75  ? ? ?Wt Readings from Last 3 Encounters:  ?03/14/22 192 lb (87.1 kg)  ?02/22/22 192 lb 1.9 oz (87.1 kg)  ?02/15/22 194 lb (88 kg)  ? ? ?Physical Exam ?Constitutional:   ?    General: She is not in acute distress. ?   Appearance: She is well-developed. She is obese.  ?HENT:  ?   Head: Normocephalic.  ?   Right Ear: External ear normal.  ?   Left Ear: External ear normal.  ?   Nose: Nose normal.  ?Eyes:  ?   General:     ?   Right eye: No discharge.     ?   Left eye: No discharge.  ?   Conjunctiva/sclera: Conjunctivae normal.  ?   Pupils: Pupils are equal, round, and reactive to light.  ?Neck:  ?   Thyroid: No thyromegaly.  ?   Vascular: No JVD.  ?   Trachea: No tracheal deviation.  ?Cardiovascular:  ?   Rate and Rhythm: Normal rate and regular rhythm.  ?   Heart sounds: Normal heart sounds.  ?Pulmonary:  ?   Effort: No respiratory distress.  ?   Breath sounds: No stridor. No wheezing.  ?Abdominal:  ?   General: Bowel sounds are normal. There is no distension.  ?   Palpations: Abdomen is soft. There is no mass.  ?   Tenderness: There is no abdominal tenderness. There is no guarding or rebound.  ?Musculoskeletal:     ?   General: No tenderness.  ?   Cervical back: Normal range of motion and neck supple. No rigidity.  ?Lymphadenopathy:  ?  Cervical: No cervical adenopathy.  ?Skin: ?   Findings: No erythema or rash.  ?Neurological:  ?   Cranial Nerves: No cranial nerve deficit.  ?   Motor: No abnormal muscle tone.  ?   Coordination: Coordination normal.  ?   Deep Tendon Reflexes: Reflexes normal.  ?Psychiatric:     ?   Behavior: Behavior normal.     ?   Thought Content: Thought content normal.     ?   Judgment: Judgment normal.  ?A round NT mobile lump over L lower jaw 6x6 mm ?L submand salivary gland is a little larger ? ? ?Lab Results  ?Component Value Date  ? WBC 9.2 02/22/2022  ? HGB 10.3 (L) 02/22/2022  ? HCT 33.1 (L) 02/22/2022  ? PLT 311 02/22/2022  ? GLUCOSE 88 12/17/2021  ? CHOL 143 12/17/2021  ? TRIG 41.0 12/17/2021  ? HDL 45.30 12/17/2021  ? LDLCALC 90 12/17/2021  ? ALT 11 12/17/2021  ? AST 16 12/17/2021  ? NA 140 12/17/2021  ? K 3.4 (L) 12/17/2021  ? CL 104 12/17/2021  ?  CREATININE 0.72 12/17/2021  ? BUN 11 12/17/2021  ? CO2 30 12/17/2021  ? TSH 1.67 12/17/2021  ? HGBA1C 5.5 03/31/2020  ? ? ?US BREAST LTD UNI LEFT INC AXILLA ? ?Addendum Date: 12/16/2021   ?ADDENDUM REPORT: 12/16/2021 10:04 ADDENDUM: Correction to the original report. The patient had no prior imaging for comparison. Electronically Signed   By: Frederico Hamman M.D.   On: 12/16/2021 10:04  ? ?Result Date: 12/16/2021 ?CLINICAL DATA:  36 year old female presenting for evaluation of a palpable lump in the left breast. EXAM: DIGITAL DIAGNOSTIC BILATERAL MAMMOGRAM WITH TOMOSYNTHESIS AND CAD; ULTRASOUND LEFT BREAST LIMITED TECHNIQUE: Bilateral digital diagnostic mammography and breast tomosynthesis was performed. The images were evaluated with computer-aided detection.; Targeted ultrasound examination of the left breast was performed. COMPARISON:  Previous exam(s). ACR Breast Density Category b: There are scattered areas of fibroglandular density. FINDINGS: No suspicious calcifications, masses or areas of distortion are seen in the bilateral breasts. Ultrasound targeted to the region of the palpable lump in the left breast at 9 o'clock, 3 cm from the nipple demonstrates normal fibroglandular tissue. No suspicious masses or areas of shadowing are identified. IMPRESSION: 1. There are no suspicious mammographic or targeted sonographic abnormalities at the palpable site of concern in the left breast. 2.  No mammographic evidence of malignancy in the bilateral breasts. RECOMMENDATION: 1. Clinical follow-up recommended for the palpable area of concern in the left breast. Any further workup should be based on clinical grounds. 2. Screening mammogram at age 30 unless there are persistent or intervening clinical concerns. (Code:SM-B-40A) I have discussed the findings and recommendations with the patient. If applicable, a reminder letter will be sent to the patient regarding the next appointment. BI-RADS CATEGORY  1: Negative.  Electronically Signed: By: Frederico Hamman M.D. On: 12/14/2021 15:16 ? ?MM DIAG BREAST TOMO BILATERAL ? ?Addendum Date: 12/16/2021   ?ADDENDUM REPORT: 12/16/2021 10:04 ADDENDUM: Correction to the original report. The patient had no prior imaging for comparison. Electronically Signed   By: Frederico Hamman M.D.   On: 12/16/2021 10:04  ? ?Result Date: 12/16/2021 ?CLINICAL DATA:  36 year old female presenting for evaluation of a palpable lump in the left breast. EXAM: DIGITAL DIAGNOSTIC BILATERAL MAMMOGRAM WITH TOMOSYNTHESIS AND CAD; ULTRASOUND LEFT BREAST LIMITED TECHNIQUE: Bilateral digital diagnostic mammography and breast tomosynthesis was performed. The images were evaluated with computer-aided detection.; Targeted ultrasound examination of the left breast was  performed. COMPARISON:  Previous exam(s). ACR Breast Density Category b: There are scattered areas of fibroglandular density. FINDINGS: No suspicious calcifications, masses or areas of distortion are seen in the bilateral breasts. Ultrasound targeted to the region of the palpable lump in the left breast at 9 o'clock, 3 cm from the nipple demonstrates normal fibroglandular tissue. No suspicious masses or areas of shadowing are identified. IMPRESSION: 1. There are no suspicious mammographic or targeted sonographic abnormalities at the palpable site of concern in the left breast. 2.  No mammographic evidence of malignancy in the bilateral breasts. RECOMMENDATION: 1. Clinical follow-up recommended for the palpable area of concern in the left breast. Any further workup should be based on clinical grounds. 2. Screening mammogram at age 29 unless there are persistent or intervening clinical concerns. (Code:SM-B-40A) I have discussed the findings and recommendations with the patient. If applicable, a reminder letter will be sent to the patient regarding the next appointment. BI-RADS CATEGORY  1: Negative. Electronically Signed: By: Frederico Hamman M.D. On:  12/14/2021 15:16 ? ? ?Assessment & Plan:  ? ?Problem List Items Addressed This Visit   ? ? Iron deficiency anemia  ?  Better after anemia Rx - s/p 3 iron  infusions ?  ?  ? Fatigue  ?  Better after anemia Rx - s/p 3 iron  infusion

## 2022-03-14 NOTE — Assessment & Plan Note (Signed)
A round NT mobile lump over L lower jaw 6x6 mm ?etiology: LN vs cyst vs stone ?ENT ref ?Keflex if worse ?

## 2022-03-14 NOTE — Assessment & Plan Note (Signed)
Better after anemia Rx - s/p 3 iron  infusions ?

## 2022-03-19 ENCOUNTER — Encounter: Payer: Self-pay | Admitting: Internal Medicine

## 2022-03-21 NOTE — Telephone Encounter (Signed)
Pt should ideally have ROV with primary care, as it takes several months to see GI, and we should examine to make sure it seems appropriate to do the referral.     If there are no "red flags" and as this is the most common problem of all human kind, many times patients are best treated with medication only, and do not need a GI procedure such as EGD, so maybe referral is not needed.  Alternatively, pt can try OTC Nexium and see Dr Alain Marion when he returns ?

## 2022-03-21 NOTE — Telephone Encounter (Signed)
Message sent in provider absence. Patient is experience chest pain, that she think is heartburn. Patient refused appt in office to be seen. Pt wants a referral to GI for evaluation. Please advise  ?

## 2022-04-05 ENCOUNTER — Ambulatory Visit: Payer: 59 | Admitting: Internal Medicine

## 2022-04-07 DIAGNOSIS — Z419 Encounter for procedure for purposes other than remedying health state, unspecified: Secondary | ICD-10-CM | POA: Diagnosis not present

## 2022-05-07 DIAGNOSIS — Z419 Encounter for procedure for purposes other than remedying health state, unspecified: Secondary | ICD-10-CM | POA: Diagnosis not present

## 2022-06-07 DIAGNOSIS — Z419 Encounter for procedure for purposes other than remedying health state, unspecified: Secondary | ICD-10-CM | POA: Diagnosis not present

## 2022-06-15 DIAGNOSIS — R590 Localized enlarged lymph nodes: Secondary | ICD-10-CM | POA: Diagnosis not present

## 2022-06-20 ENCOUNTER — Encounter: Payer: Self-pay | Admitting: Family

## 2022-06-27 ENCOUNTER — Inpatient Hospital Stay: Payer: Medicaid Other | Attending: Hematology & Oncology

## 2022-06-27 ENCOUNTER — Inpatient Hospital Stay: Payer: Medicaid Other | Admitting: Family

## 2022-07-08 DIAGNOSIS — Z419 Encounter for procedure for purposes other than remedying health state, unspecified: Secondary | ICD-10-CM | POA: Diagnosis not present

## 2022-07-25 ENCOUNTER — Ambulatory Visit (INDEPENDENT_AMBULATORY_CARE_PROVIDER_SITE_OTHER): Payer: Medicaid Other | Admitting: Obstetrics and Gynecology

## 2022-07-25 ENCOUNTER — Encounter: Payer: Self-pay | Admitting: Obstetrics and Gynecology

## 2022-07-25 VITALS — BP 133/82 | HR 102 | Ht 60.0 in | Wt 193.0 lb

## 2022-07-25 DIAGNOSIS — Z3009 Encounter for other general counseling and advice on contraception: Secondary | ICD-10-CM | POA: Diagnosis not present

## 2022-07-25 MED ORDER — ETONOGESTREL-ETHINYL ESTRADIOL 0.12-0.015 MG/24HR VA RING: VAGINAL_RING | VAGINAL | 12 refills | Status: DC

## 2022-07-25 NOTE — Progress Notes (Signed)
Pt would like to discuss BC options, has used [employer] and Depo in the past.

## 2022-07-25 NOTE — Progress Notes (Signed)
36 yo P1 here for contraception counseling. She was previously using NuvaRing but has not used it in over a year. She is sexually active without contraception. She denies pelvic pain or abnormal discharge. Patient is without complaints  Past Medical History:  Diagnosis Date   Anemia    no current med.   Fracture of radius, distal, right, closed 03/20/2015   GERD (gastroesophageal reflux disease)    OTC as needed   History of cardiac murmur    states workup 2013 - no problems identified   Radius fracture 03/16/2015   right   Past Surgical History:  Procedure Laterality Date   CESAREAN SECTION  03/15/2006   OPEN REDUCTION INTERNAL FIXATION (ORIF) DISTAL RADIAL FRACTURE Right 03/20/2015   Procedure: OPEN REDUCTION INTERNAL FIXATION RIGHT DISTAL RADIUS;  Surgeon: Marchia Bond, MD;  Location: [facility];  Service: Orthopedics;  Laterality: Right;   Family History  Problem Relation Age of Onset   Heart failure Paternal Grandmother    Social History   Tobacco Use   Smoking status: Never   Smokeless tobacco: Never  Vaping Use   Vaping Use: Never used  Substance Use Topics   Alcohol use: Yes    Comment: occasionally   Drug use: No   ROS See pertinent in HPI. All other systems reviewed and non contributory Blood pressure 133/82, pulse (!) 102, height 5' (1.524 m), weight 193 lb (87.5 kg), last menstrual period 07/05/2022. GENERAL: Well-developed, well-nourished female in no acute distress.  NEURO: alert and oriented x 3  A/P 36 yo here for contraception counseling - Contraception options reviewed - patient desires to continue with Nuvaring and plans to return for Paraguard IUD

## 2022-08-07 DIAGNOSIS — Z419 Encounter for procedure for purposes other than remedying health state, unspecified: Secondary | ICD-10-CM | POA: Diagnosis not present

## 2022-09-07 DIAGNOSIS — Z419 Encounter for procedure for purposes other than remedying health state, unspecified: Secondary | ICD-10-CM | POA: Diagnosis not present

## 2022-09-13 ENCOUNTER — Ambulatory Visit (INDEPENDENT_AMBULATORY_CARE_PROVIDER_SITE_OTHER): Payer: Medicaid Other | Admitting: Obstetrics

## 2022-09-13 ENCOUNTER — Encounter: Payer: Self-pay | Admitting: Obstetrics

## 2022-09-13 VITALS — BP 119/81 | HR 81 | Wt 198.0 lb

## 2022-09-13 DIAGNOSIS — E669 Obesity, unspecified: Secondary | ICD-10-CM | POA: Diagnosis not present

## 2022-09-13 DIAGNOSIS — N926 Irregular menstruation, unspecified: Secondary | ICD-10-CM | POA: Diagnosis not present

## 2022-09-13 DIAGNOSIS — D508 Other iron deficiency anemias: Secondary | ICD-10-CM | POA: Diagnosis not present

## 2022-09-13 LAB — POCT URINE PREGNANCY: Preg Test, Ur: NEGATIVE

## 2022-09-13 MED ORDER — CITRANATAL BLOOM 90-1 MG PO TABS: 1.0000 | ORAL_TABLET | Freq: Every day | ORAL | 11 refills | Status: DC

## 2022-09-13 NOTE — Progress Notes (Signed)
Pt presents for missing Oct cycle. Pt has questions regarding PCOS.  Declines STD testing  Negative pap 11/24/21

## 2022-09-13 NOTE — Progress Notes (Signed)
Patient ID: [name], female   DOB: February 06, 1986, 36 y.o.   MRN: [id number]  Chief Complaint  Patient presents with   Menstrual Problem    HPI [name] is a 36 y.o. female.  Missed period in August.  Denies vaginal discharge or pelvic pain.  Periods are usually regular. HPI  Past Medical History:  Diagnosis Date   Anemia    no current med.   Fracture of radius, distal, right, closed 03/20/2015   GERD (gastroesophageal reflux disease)    OTC as needed   History of cardiac murmur    states workup 2013 - no problems identified   Radius fracture 03/16/2015   right    Past Surgical History:  Procedure Laterality Date   CESAREAN SECTION  03/15/2006   OPEN REDUCTION INTERNAL FIXATION (ORIF) DISTAL RADIAL FRACTURE Right 03/20/2015   Procedure: OPEN REDUCTION INTERNAL FIXATION RIGHT DISTAL RADIUS;  Surgeon: Marchia Bond, MD;  Location: [facility];  Service: Orthopedics;  Laterality: Right;    Family History  Problem Relation Age of Onset   Heart failure Paternal Grandmother     Social History Social History   Tobacco Use   Smoking status: Never    Passive exposure: Never   Smokeless tobacco: Never  Vaping Use   Vaping Use: Never used  Substance Use Topics   Alcohol use: Yes    Comment: occasionally   Drug use: No    No Known Allergies  Current Outpatient Medications  Medication Sig Dispense Refill   Prenatal-DSS-FeCb-FeGl-FA (CITRANATAL BLOOM) 90-1 MG TABS Take 1 tablet by mouth daily before breakfast. 30 tablet 11   Cholecalciferol (VITAMIN D3) 50 MCG (2000 UT) capsule Take 1 capsule (2,000 Units total) by mouth daily. (Patient not taking: Reported on 09/13/2022) 100 capsule 3   etonogestrel-ethinyl estradiol (NUVARING) 0.12-0.015 MG/24HR vaginal ring Insert vaginally and leave in place for 3 consecutive weeks, then remove for 1 week. (Patient not taking: Reported on 09/13/2022) 1 each 12   etonogestrel-ethinyl estradiol (NUVARING) 0.12-0.015 MG/24HR  vaginal ring Insert vaginally and leave in place for 3 consecutive weeks, then remove for 1 week. (Patient not taking: Reported on 09/13/2022) 1 each 12   Olopatadine HCl 0.2 % SOLN Apply 1 drop to eye 2 (two) times daily. (Patient not taking: Reported on 09/13/2022) 2.5 mL 0   No current facility-administered medications for this visit.    Review of Systems Review of Systems Constitutional: negative for fatigue and weight loss Respiratory: negative for cough and wheezing Cardiovascular: negative for chest pain, fatigue and palpitations Gastrointestinal: negative for abdominal pain and change in bowel habits Genitourinary:positive for abnormal period Integument/breast: negative for nipple discharge Musculoskeletal:negative for myalgias Neurological: negative for gait problems and tremors Behavioral/Psych: negative for abusive relationship, depression Endocrine: negative for temperature intolerance      Blood pressure 119/81, pulse 81, weight 198 lb (89.8 kg), last menstrual period 09/11/2022.  Physical Exam Physical Exam General:   Alert and no distress  Skin:   no rash or abnormalities  Lungs:   clear to auscultation bilaterally  Heart:   regular rate and rhythm, S1, S2 normal, no murmur, click, rub or gallop  The remainder of the physical exam deferred because patient is currently on period and declines.  I have spent a total of 15 minutes of face-to-face time, excluding clinical staff time, reviewing notes and preparing to see patient, ordering tests and/or medications, and counseling the patient. .   Data Reviewed [facility]  Assessment  1. Missed period Rx: - POCT urine pregnancy:  NEGATIVE  2. Iron deficiency anemia secondary to inadequate dietary iron intake Rx: - Prenatal-DSS-FeCb-FeGl-FA (CITRANATAL BLOOM) 90-1 MG TABS; Take 1 tablet by mouth daily before breakfast.  Dispense: 30 tablet; Refill: 11  3. Obesity (BMI 35.0-39.9 without comorbidity) - weight reduction  recommended     Plan   Follow up in 2 months  Orders Placed This Encounter  Procedures   POCT urine pregnancy   Meds ordered this encounter  Medications   Prenatal-DSS-FeCb-FeGl-FA (CITRANATAL BLOOM) 90-1 MG TABS    Sig: Take 1 tablet by mouth daily before breakfast.    Dispense:  30 tablet    Refill:  11     Brock Bad, MD 09/13/2022 10:28 AM

## 2022-10-07 DIAGNOSIS — Z419 Encounter for procedure for purposes other than remedying health state, unspecified: Secondary | ICD-10-CM | POA: Diagnosis not present

## 2022-11-07 DIAGNOSIS — Z419 Encounter for procedure for purposes other than remedying health state, unspecified: Secondary | ICD-10-CM | POA: Diagnosis not present

## 2022-11-29 ENCOUNTER — Ambulatory Visit (INDEPENDENT_AMBULATORY_CARE_PROVIDER_SITE_OTHER): Payer: 59 | Admitting: Internal Medicine

## 2022-11-29 ENCOUNTER — Ambulatory Visit (INDEPENDENT_AMBULATORY_CARE_PROVIDER_SITE_OTHER): Payer: 59

## 2022-11-29 ENCOUNTER — Encounter: Payer: Self-pay | Admitting: Family

## 2022-11-29 ENCOUNTER — Encounter: Payer: Self-pay | Admitting: Internal Medicine

## 2022-11-29 VITALS — BP 110/80 | HR 80 | Temp 99.0°F | Ht 60.0 in | Wt 198.0 lb

## 2022-11-29 DIAGNOSIS — R110 Nausea: Secondary | ICD-10-CM

## 2022-11-29 DIAGNOSIS — K219 Gastro-esophageal reflux disease without esophagitis: Secondary | ICD-10-CM | POA: Diagnosis not present

## 2022-11-29 DIAGNOSIS — K5901 Slow transit constipation: Secondary | ICD-10-CM | POA: Diagnosis not present

## 2022-11-29 DIAGNOSIS — D500 Iron deficiency anemia secondary to blood loss (chronic): Secondary | ICD-10-CM | POA: Diagnosis not present

## 2022-11-29 LAB — CBC WITH DIFFERENTIAL/PLATELET
Basophils Absolute: 0 10*3/uL (ref 0.0–0.1)
Basophils Relative: 0.7 % (ref 0.0–3.0)
Eosinophils Absolute: 0.2 10*3/uL (ref 0.0–0.7)
Eosinophils Relative: 2.3 % (ref 0.0–5.0)
HCT: 35.4 % — ABNORMAL LOW (ref 36.0–46.0)
Hemoglobin: 11.6 g/dL — ABNORMAL LOW (ref 12.0–15.0)
Lymphocytes Relative: 40.8 % (ref 12.0–46.0)
Lymphs Abs: 2.7 10*3/uL (ref 0.7–4.0)
MCHC: 32.9 g/dL (ref 30.0–36.0)
MCV: 84.6 fl (ref 78.0–100.0)
Monocytes Absolute: 0.5 10*3/uL (ref 0.1–1.0)
Monocytes Relative: 7.2 % (ref 3.0–12.0)
Neutro Abs: 3.3 10*3/uL (ref 1.4–7.7)
Neutrophils Relative %: 49 % (ref 43.0–77.0)
Platelets: 355 10*3/uL (ref 150.0–400.0)
RBC: 4.18 Mil/uL (ref 3.87–5.11)
RDW: 13 % (ref 11.5–15.5)
WBC: 6.7 10*3/uL (ref 4.0–10.5)

## 2022-11-29 LAB — URINALYSIS
Bilirubin Urine: NEGATIVE
Ketones, ur: NEGATIVE
Leukocytes,Ua: NEGATIVE
Nitrite: NEGATIVE
Specific Gravity, Urine: >=1.03 — AB (ref 1.000–1.030)
Total Protein, Urine: NEGATIVE
Urine Glucose: NEGATIVE
Urobilinogen, UA: 1 (ref 0.0–1.0)
pH: 6 (ref 5.0–8.0)

## 2022-11-29 LAB — COMPREHENSIVE METABOLIC PANEL
ALT: 8 U/L (ref 0–35)
AST: 13 U/L (ref 0–37)
Albumin: 4.1 g/dL (ref 3.5–5.2)
Alkaline Phosphatase: 53 U/L (ref 39–117)
BUN: 9 mg/dL (ref 6–23)
CO2: 25 mEq/L (ref 19–32)
Calcium: 9.3 mg/dL (ref 8.4–10.5)
Chloride: 106 mEq/L (ref 96–112)
Creatinine, Ser: 0.63 mg/dL (ref 0.40–1.20)
GFR: 114.95 mL/min (ref >60.00)
Glucose, Bld: 95 mg/dL (ref 70–99)
Potassium: 4 mEq/L (ref 3.5–5.1)
Sodium: 138 mEq/L (ref 135–145)
Total Bilirubin: 0.4 mg/dL (ref 0.2–1.2)
Total Protein: 7.3 g/dL (ref 6.0–8.3)

## 2022-11-29 LAB — H. PYLORI ANTIBODY, IGG: H Pylori IgG: NEGATIVE

## 2022-11-29 LAB — LIPASE: Lipase: 4 U/L — ABNORMAL LOW (ref 11.0–59.0)

## 2022-11-29 LAB — TSH: TSH: 1.63 u[IU]/mL (ref 0.35–5.50)

## 2022-11-29 LAB — VITAMIN D 25 HYDROXY (VIT D DEFICIENCY, FRACTURES): VITD: 13.35 ng/mL — ABNORMAL LOW (ref 30.00–100.00)

## 2022-11-29 LAB — VITAMIN B12: Vitamin B-12: 367 pg/mL (ref 211–911)

## 2022-11-29 MED ORDER — FAMOTIDINE 40 MG PO TABS: 40.0000 mg | ORAL_TABLET | Freq: Every day | ORAL | 3 refills | Status: DC

## 2022-11-29 MED ORDER — POLYETHYLENE GLYCOL 3350 17 GM/SCOOP PO POWD: 17.0000 g | Freq: Every day | ORAL | 6 refills | Status: DC | PRN

## 2022-11-29 NOTE — Progress Notes (Signed)
Subjective:  Patient ID: [name], female    DOB: [birth year]  Age: 37 y.o. MRN: [id number]  CC: No chief complaint on file.   HPI [name] presents for GERD, bloating, nausea, LBP x weeks C/o constipation - 30 min on the toilet daily...  On BCP  Outpatient Medications Prior to Visit  Medication Sig Dispense Refill   etonogestrel-ethinyl estradiol (NUVARING) 0.12-0.015 MG/24HR vaginal ring Insert vaginally and leave in place for 3 consecutive weeks, then remove for 1 week. 1 each 12   Prenatal-DSS-FeCb-FeGl-FA (CITRANATAL BLOOM) 90-1 MG TABS Take 1 tablet by mouth daily before breakfast. 30 tablet 11   Cholecalciferol (VITAMIN D3) 50 MCG (2000 UT) capsule Take 1 capsule (2,000 Units total) by mouth daily. (Patient not taking: Reported on 09/13/2022) 100 capsule 3   etonogestrel-ethinyl estradiol (NUVARING) 0.12-0.015 MG/24HR vaginal ring Insert vaginally and leave in place for 3 consecutive weeks, then remove for 1 week. (Patient not taking: Reported on 09/13/2022) 1 each 12   Olopatadine HCl 0.2 % SOLN Apply 1 drop to eye 2 (two) times daily. (Patient not taking: Reported on 09/13/2022) 2.5 mL 0   No facility-administered medications prior to visit.    ROS: Review of Systems  Constitutional:  Positive for fatigue. Negative for activity change, appetite change, chills and unexpected weight change.  HENT:  Negative for congestion, mouth sores and sinus pressure.   Eyes:  Negative for visual disturbance.  Respiratory:  Negative for cough and chest tightness.   Gastrointestinal:  Positive for nausea. Negative for abdominal pain.  Genitourinary:  Negative for difficulty urinating, frequency and vaginal pain.  Musculoskeletal:  Positive for back pain. Negative for gait problem.  Skin:  Negative for pallor and rash.  Neurological:  Negative for dizziness, tremors, weakness, numbness and headaches.  Psychiatric/Behavioral:  Negative for confusion and sleep disturbance. The patient  is not nervous/anxious.     Objective:  BP 110/80 (BP Location: Left Arm, Patient Position: Sitting, Cuff Size: Normal)   Pulse 80   Temp 99 F (37.2 C) (Oral)   Ht 5' (1.524 m)   Wt 198 lb (89.8 kg)   SpO2 99%   BMI 38.67 kg/m   BP Readings from Last 3 Encounters:  11/29/22 110/80  09/13/22 119/81  07/25/22 133/82    Wt Readings from Last 3 Encounters:  11/29/22 198 lb (89.8 kg)  09/13/22 198 lb (89.8 kg)  07/25/22 193 lb (87.5 kg)    Physical Exam Constitutional:      General: She is not in acute distress.    Appearance: She is well-developed. She is obese.  HENT:     Head: Normocephalic.     Right Ear: External ear normal.     Left Ear: External ear normal.     Nose: Nose normal.  Eyes:     General:        Right eye: No discharge.        Left eye: No discharge.     Conjunctiva/sclera: Conjunctivae normal.     Pupils: Pupils are equal, round, and reactive to light.  Neck:     Thyroid: No thyromegaly.     Vascular: No JVD.     Trachea: No tracheal deviation.  Cardiovascular:     Rate and Rhythm: Normal rate and regular rhythm.     Heart sounds: Normal heart sounds.  Pulmonary:     Effort: No respiratory distress.     Breath sounds: No stridor. No wheezing.  Abdominal:  General: Bowel sounds are normal. There is no distension.     Palpations: Abdomen is soft. There is no mass.     Tenderness: There is no abdominal tenderness. There is no guarding or rebound.  Musculoskeletal:        General: No tenderness.     Cervical back: Normal range of motion and neck supple. No rigidity.  Lymphadenopathy:     Cervical: No cervical adenopathy.  Skin:    Findings: No erythema or rash.  Neurological:     Mental Status: She is oriented to person, place, and time.     Cranial Nerves: No cranial nerve deficit.     Motor: No abnormal muscle tone.     Coordination: Coordination normal.     Deep Tendon Reflexes: Reflexes normal.  Psychiatric:        Behavior:  Behavior normal.        Thought Content: Thought content normal.        Judgment: Judgment normal.     Lab Results  Component Value Date   WBC 9.2 02/22/2022   HGB 10.3 (L) 02/22/2022   HCT 33.1 (L) 02/22/2022   PLT 311 02/22/2022   GLUCOSE 88 12/17/2021   CHOL 143 12/17/2021   TRIG 41.0 12/17/2021   HDL 45.30 12/17/2021   LDLCALC 90 12/17/2021   ALT 11 12/17/2021   AST 16 12/17/2021   NA 140 12/17/2021   K 3.4 (L) 12/17/2021   CL 104 12/17/2021   CREATININE 0.72 12/17/2021   BUN 11 12/17/2021   CO2 30 12/17/2021   TSH 1.67 12/17/2021   HGBA1C 5.5 03/31/2020    MM DIAG BREAST TOMO BILATERAL  Addendum Date: 12/16/2021   ADDENDUM REPORT: 12/16/2021 10:04 ADDENDUM: Correction to the original report. The patient had no prior imaging for comparison. Electronically Signed   By: Frederico Hamman M.D.   On: 12/16/2021 10:04   Result Date: 12/16/2021 CLINICAL DATA:  37 year old female presenting for evaluation of a palpable lump in the left breast. EXAM: DIGITAL DIAGNOSTIC BILATERAL MAMMOGRAM WITH TOMOSYNTHESIS AND CAD; ULTRASOUND LEFT BREAST LIMITED TECHNIQUE: Bilateral digital diagnostic mammography and breast tomosynthesis was performed. The images were evaluated with computer-aided detection.; Targeted ultrasound examination of the left breast was performed. COMPARISON:  Previous exam(s). ACR Breast Density Category b: There are scattered areas of fibroglandular density. FINDINGS: No suspicious calcifications, masses or areas of distortion are seen in the bilateral breasts. Ultrasound targeted to the region of the palpable lump in the left breast at 9 o'clock, 3 cm from the nipple demonstrates normal fibroglandular tissue. No suspicious masses or areas of shadowing are identified. IMPRESSION: 1. There are no suspicious mammographic or targeted sonographic abnormalities at the palpable site of concern in the left breast. 2.  No mammographic evidence of malignancy in the bilateral breasts.  RECOMMENDATION: 1. Clinical follow-up recommended for the palpable area of concern in the left breast. Any further workup should be based on clinical grounds. 2. Screening mammogram at age 32 unless there are persistent or intervening clinical concerns. (Code:SM-B-40A) I have discussed the findings and recommendations with the patient. If applicable, a reminder letter will be sent to the patient regarding the next appointment. BI-RADS CATEGORY  1: Negative. Electronically Signed: By: Frederico Hamman M.D. On: 12/14/2021 15:16  US BREAST LTD UNI LEFT INC AXILLA  Addendum Date: 12/16/2021   ADDENDUM REPORT: 12/16/2021 10:04 ADDENDUM: Correction to the original report. The patient had no prior imaging for comparison. Electronically Signed   By: Marcelino Duster  Theda Sers M.D.   On: 12/16/2021 10:04   Result Date: 12/16/2021 CLINICAL DATA:  37 year old female presenting for evaluation of a palpable lump in the left breast. EXAM: DIGITAL DIAGNOSTIC BILATERAL MAMMOGRAM WITH TOMOSYNTHESIS AND CAD; ULTRASOUND LEFT BREAST LIMITED TECHNIQUE: Bilateral digital diagnostic mammography and breast tomosynthesis was performed. The images were evaluated with computer-aided detection.; Targeted ultrasound examination of the left breast was performed. COMPARISON:  Previous exam(s). ACR Breast Density Category b: There are scattered areas of fibroglandular density. FINDINGS: No suspicious calcifications, masses or areas of distortion are seen in the bilateral breasts. Ultrasound targeted to the region of the palpable lump in the left breast at 9 o'clock, 3 cm from the nipple demonstrates normal fibroglandular tissue. No suspicious masses or areas of shadowing are identified. IMPRESSION: 1. There are no suspicious mammographic or targeted sonographic abnormalities at the palpable site of concern in the left breast. 2.  No mammographic evidence of malignancy in the bilateral breasts. RECOMMENDATION: 1. Clinical follow-up recommended for the  palpable area of concern in the left breast. Any further workup should be based on clinical grounds. 2. Screening mammogram at age 55 unless there are persistent or intervening clinical concerns. (Code:SM-B-40A) I have discussed the findings and recommendations with the patient. If applicable, a reminder letter will be sent to the patient regarding the next appointment. BI-RADS CATEGORY  1: Negative. Electronically Signed: By: Ammie Ferrier M.D. On: 12/14/2021 15:16   Assessment & Plan:   Problem List Items Addressed This Visit       Digestive   GERD    Worse Start Pepcid      Relevant Medications   famotidine (PEPCID) 40 MG tablet   polyethylene glycol powder (GLYCOLAX/MIRALAX) 17 GM/SCOOP powder   Other Relevant Orders   CBC with Differential/Platelet   Comprehensive metabolic panel   TSH   Urinalysis   Vitamin B12   VITAMIN D 25 Hydroxy (Vit-D Deficiency, Fractures)   Lipase   Ambulatory referral to Gastroenterology   H. pylori antibody, IgG     Other   Constipation    Start Miralax Check UB      Relevant Orders   CBC with Differential/Platelet   Comprehensive metabolic panel   TSH   Urinalysis   Vitamin B12   VITAMIN D 25 Hydroxy (Vit-D Deficiency, Fractures)   Lipase   Ambulatory referral to Gastroenterology   DG Abd 2 Views   H. pylori antibody, IgG   Iron deficiency anemia - Primary    On infusions      Relevant Orders   CBC with Differential/Platelet   Comprehensive metabolic panel   TSH   Urinalysis   Vitamin B12   VITAMIN D 25 Hydroxy (Vit-D Deficiency, Fractures)   Lipase   Ambulatory referral to Gastroenterology   H. pylori antibody, IgG   Nausea    Chronic Check LFTs and UA Consider abd [place]      Relevant Orders   CBC with Differential/Platelet   Comprehensive metabolic panel   TSH   Urinalysis   Vitamin B12   VITAMIN D 25 Hydroxy (Vit-D Deficiency, Fractures)   Lipase   Ambulatory referral to Gastroenterology   DG Abd 2 Views    H. pylori antibody, IgG      Meds ordered this encounter  Medications   famotidine (PEPCID) 40 MG tablet    Sig: Take 1 tablet (40 mg total) by mouth daily.    Dispense:  90 tablet    Refill:  3   polyethylene glycol  powder (GLYCOLAX/MIRALAX) 17 GM/SCOOP powder    Sig: Take 17 g by mouth daily as needed.    Dispense:  500 g    Refill:  6      Follow-up: Return in about 6 weeks (around 01/10/2023) for a follow-up visit.  Sonda Primes, MD

## 2022-11-29 NOTE — Assessment & Plan Note (Addendum)
Chronic Check LFTs and UA Consider abd [place]

## 2022-11-29 NOTE — Assessment & Plan Note (Signed)
Worse Start Pepcid  

## 2022-11-29 NOTE — Assessment & Plan Note (Signed)
Start Miralax Check UB

## 2022-11-29 NOTE — Assessment & Plan Note (Signed)
On infusions

## 2022-11-30 ENCOUNTER — Encounter: Payer: Self-pay | Admitting: Physician Assistant

## 2022-12-01 ENCOUNTER — Other Ambulatory Visit: Payer: Self-pay | Admitting: Internal Medicine

## 2022-12-01 ENCOUNTER — Encounter: Payer: Self-pay | Admitting: Internal Medicine

## 2022-12-01 MED ORDER — VITAMIN D3 50 MCG (2000 UT) PO CAPS: 4000.0000 [IU] | ORAL_CAPSULE | Freq: Every day | ORAL | 3 refills | Status: AC

## 2022-12-01 MED ORDER — VITAMIN D (ERGOCALCIFEROL) 1.25 MG (50000 UNIT) PO CAPS: 50000.0000 [IU] | ORAL_CAPSULE | ORAL | 0 refills | Status: DC

## 2022-12-06 ENCOUNTER — Inpatient Hospital Stay (HOSPITAL_BASED_OUTPATIENT_CLINIC_OR_DEPARTMENT_OTHER): Payer: 59 | Admitting: Family

## 2022-12-06 ENCOUNTER — Inpatient Hospital Stay: Payer: 59 | Attending: Hematology & Oncology

## 2022-12-06 ENCOUNTER — Encounter: Payer: Self-pay | Admitting: Family

## 2022-12-06 VITALS — BP 108/79 | HR 83 | Temp 99.0°F | Resp 17 | Wt 191.1 lb

## 2022-12-06 DIAGNOSIS — N920 Excessive and frequent menstruation with regular cycle: Secondary | ICD-10-CM

## 2022-12-06 DIAGNOSIS — D500 Iron deficiency anemia secondary to blood loss (chronic): Secondary | ICD-10-CM | POA: Diagnosis not present

## 2022-12-06 LAB — CBC WITH DIFFERENTIAL (CANCER CENTER ONLY)
Abs Immature Granulocytes: 0.06 10*3/uL (ref 0.00–0.07)
Basophils Absolute: 0.1 10*3/uL (ref 0.0–0.1)
Basophils Relative: 1 %
Eosinophils Absolute: 0.1 10*3/uL (ref 0.0–0.5)
Eosinophils Relative: 2 %
HCT: 36 % (ref 36.0–46.0)
Hemoglobin: 11.6 g/dL — ABNORMAL LOW (ref 12.0–15.0)
Immature Granulocytes: 1 %
Lymphocytes Relative: 40 %
Lymphs Abs: 2.8 10*3/uL (ref 0.7–4.0)
MCH: 27.7 pg (ref 26.0–34.0)
MCHC: 32.2 g/dL (ref 30.0–36.0)
MCV: 85.9 fL (ref 80.0–100.0)
Monocytes Absolute: 0.5 10*3/uL (ref 0.1–1.0)
Monocytes Relative: 7 %
Neutro Abs: 3.5 10*3/uL (ref 1.7–7.7)
Neutrophils Relative %: 49 %
Platelet Count: 339 10*3/uL (ref 150–400)
RBC: 4.19 MIL/uL (ref 3.87–5.11)
RDW: 12.2 % (ref 11.5–15.5)
WBC Count: 7 10*3/uL (ref 4.0–10.5)
nRBC: 0 % (ref 0.0–0.2)

## 2022-12-06 LAB — FERRITIN: Ferritin: 50 ng/mL (ref 11–307)

## 2022-12-06 LAB — IRON AND IRON BINDING CAPACITY (CC-WL,HP ONLY)
Iron: 39 ug/dL (ref 28–170)
Saturation Ratios: 11 % (ref 10.4–31.8)
TIBC: 344 ug/dL (ref 250–450)
UIBC: 305 ug/dL (ref 148–442)

## 2022-12-06 LAB — RETICULOCYTES
Immature Retic Fract: 12.3 % (ref 2.3–15.9)
RBC.: 4.2 MIL/uL (ref 3.87–5.11)
Retic Count, Absolute: 88.6 10*3/uL (ref 19.0–186.0)
Retic Ct Pct: 2.1 % (ref 0.4–3.1)

## 2022-12-06 NOTE — Progress Notes (Signed)
Hematology and Oncology Follow Up Visit  [name] [id number] August 04, 1986 37 y.o. 12/06/2022   Principle Diagnosis:  Iron deficiency anemia secondary to chronic blood loss due to menorrhagia   Current Therapy:        IV iron as indicated    Interim History:  [name] is here today for follow-up. She is symptomatic with fatigue, occasional dizziness and occasional palpitations.  Her cycle is regular with heavy flow. No other blood loss noted. No bruising or petechiae.  No fever, chills, n/v, cough, rash, SOB, chest pain, abdominal pain or changes in bowel or bladder habits.  No swelling, tenderness, numbness or tingling in her extremities.  No falls or syncope.  Appetite is good. She admits that she needs to better hydrate throughout the day. Weight is stable at  191 lbs.   ECOG Performance Status: 1 - Symptomatic but completely ambulatory  Medications:  Allergies as of 12/06/2022   No Known Allergies      Medication List        Accurate as of December 06, 2022 10:09 AM. If you have any questions, ask your nurse or doctor.          CitraNatal Bloom 90-1 MG Tabs Take 1 tablet by mouth daily before breakfast.   etonogestrel-ethinyl estradiol 0.12-0.015 MG/24HR vaginal ring Commonly known as: NUVARING Insert vaginally and leave in place for 3 consecutive weeks, then remove for 1 week.   famotidine 40 MG tablet Commonly known as: Pepcid Take 1 tablet (40 mg total) by mouth daily.   polyethylene glycol powder 17 GM/SCOOP powder Commonly known as: GLYCOLAX/MIRALAX Take 17 g by mouth daily as needed.   Vitamin D (Ergocalciferol) 1.25 MG (50000 UNIT) Caps capsule Commonly known as: DRISDOL Take 1 capsule (50,000 Units total) by mouth every 7 (seven) days.   Vitamin D3 50 MCG (2000 UT) capsule Take 1 capsule (2,000 Units total) by mouth daily.   Vitamin D3 50 MCG (2000 UT) capsule Take 2 capsules (4,000 Units total) by mouth daily.        Allergies: No  Known Allergies  Past Medical History, Surgical history, Social history, and Family History were reviewed and updated.  Review of Systems: All other 10 point review of systems is negative.   Physical Exam:  vitals were not taken for this visit.   Wt Readings from Last 3 Encounters:  11/29/22 198 lb (89.8 kg)  09/13/22 198 lb (89.8 kg)  07/25/22 193 lb (87.5 kg)    Ocular: Sclerae unicteric, pupils equal, round and reactive to light Ear-nose-throat: Oropharynx clear, dentition fair Lymphatic: No cervical or supraclavicular adenopathy Lungs no rales or rhonchi, good excursion bilaterally Heart regular rate and rhythm, no murmur appreciated Abd soft, nontender, positive bowel sounds MSK no focal spinal tenderness, no joint edema Neuro: non-focal, well-oriented, appropriate affect Breasts: Deferred   Lab Results  Component Value Date   WBC 7.0 12/06/2022   HGB 11.6 (L) 12/06/2022   HCT 36.0 12/06/2022   MCV 85.9 12/06/2022   PLT 339 12/06/2022   Lab Results  Component Value Date   FERRITIN 6 (L) 02/22/2022   IRON 25 (L) 02/22/2022   TIBC 452 (H) 02/22/2022   UIBC 427 02/22/2022   IRONPCTSAT 6 (L) 02/22/2022   Lab Results  Component Value Date   RETICCTPCT 2.1 12/06/2022   RBC 4.20 12/06/2022   RBC 4.19 12/06/2022   No results found for: "KPAFRELGTCHN", "LAMBDASER", "KAPLAMBRATIO" Lab Results  Component Value Date   IGA 310  11/16/2009   No results found for: "TOTALPROTELP", "ALBUMINELP", "A1GS", "A2GS", "BETS", "BETA2SER", "GAMS", "MSPIKE", "SPEI"   Chemistry      Component Value Date/Time   NA 138 11/29/2022 0938   K 4.0 11/29/2022 0938   CL 106 11/29/2022 0938   CO2 25 11/29/2022 0938   BUN 9 11/29/2022 0938   CREATININE 0.63 11/29/2022 0938      Component Value Date/Time   CALCIUM 9.3 11/29/2022 0938   ALKPHOS 53 11/29/2022 0938   AST 13 11/29/2022 0938   ALT 8 11/29/2022 0938   BILITOT 0.4 11/29/2022 0938       Impression and Plan: [name]  is a very pleasant 37 yo African American female with iron deficiency anemia secondary to heavy cycles.  Iron studies are pending.  We will plan to see her again in 6 months.   [name], NP 1/30/202410:09 AM

## 2022-12-07 ENCOUNTER — Other Ambulatory Visit: Payer: Self-pay | Admitting: Internal Medicine

## 2022-12-07 DIAGNOSIS — R1084 Generalized abdominal pain: Secondary | ICD-10-CM

## 2022-12-07 DIAGNOSIS — R110 Nausea: Secondary | ICD-10-CM

## 2022-12-08 DIAGNOSIS — Z419 Encounter for procedure for purposes other than remedying health state, unspecified: Secondary | ICD-10-CM | POA: Diagnosis not present

## 2022-12-16 ENCOUNTER — Other Ambulatory Visit: Payer: 59

## 2022-12-16 ENCOUNTER — Ambulatory Visit (INDEPENDENT_AMBULATORY_CARE_PROVIDER_SITE_OTHER): Payer: 59 | Admitting: Physician Assistant

## 2022-12-16 ENCOUNTER — Encounter: Payer: Self-pay | Admitting: Physician Assistant

## 2022-12-16 VITALS — BP 118/68 | HR 78 | Ht 60.0 in | Wt 193.0 lb

## 2022-12-16 DIAGNOSIS — R109 Unspecified abdominal pain: Secondary | ICD-10-CM | POA: Diagnosis not present

## 2022-12-16 DIAGNOSIS — R110 Nausea: Secondary | ICD-10-CM

## 2022-12-16 DIAGNOSIS — R194 Change in bowel habit: Secondary | ICD-10-CM

## 2022-12-16 DIAGNOSIS — R140 Abdominal distension (gaseous): Secondary | ICD-10-CM

## 2022-12-16 DIAGNOSIS — E739 Lactose intolerance, unspecified: Secondary | ICD-10-CM

## 2022-12-16 DIAGNOSIS — R195 Other fecal abnormalities: Secondary | ICD-10-CM

## 2022-12-16 MED ORDER — PLENVU 140 G PO SOLR: 1.0000 | Freq: Once | ORAL | 0 refills | Status: AC

## 2022-12-16 NOTE — Patient Instructions (Signed)
_______________________________________________________  If your blood pressure at your visit was 140/90 or greater, please contact your primary care physician to follow up on this.  _______________________________________________________  If you are age 36 or older, your body mass index should be between 23-30. Your Body mass index is 37.69 kg/m. If this is out of the aforementioned range listed, please consider follow up with your Primary Care Provider.  If you are age 38 or younger, your body mass index should be between 19-25. Your Body mass index is 37.69 kg/m. If this is out of the aformentioned range listed, please consider follow up with your Primary Care Provider.   ________________________________________________________  The [facility] providers would like to encourage you to use [facility] to communicate with providers for non-urgent requests or questions.  Due to long hold times on the telephone, sending your provider a message by [facility] may be a faster and more efficient way to get a response.  Please allow 48 business hours for a response.  Please remember that this is for non-urgent requests.  _______________________________________________________  Continue Omeprazole over the counter daily in the morning.  Add Benefiber - one dose daily in a glass of water.  Strict Lactose free diet  Try Lactaid tablets - one to two with meals  You have been scheduled for an endoscopy and colonoscopy. Please follow the written instructions given to you at your visit today. Please pick up your prep supplies at the pharmacy within the next 1-3 days. If you use inhalers (even only as needed), please bring them with you on the day of your procedure.

## 2022-12-16 NOTE — Progress Notes (Signed)
Subjective:    Patient ID: [name], female    DOB: [birth year], 37 y.o.   MRN: [id number]  HPI  [name] is a 37 year old African-American female, who comes in today with complaints of chronic nausea which has been present for years and occurs most days.  She is referred by Dr. Alain Marion. She has also been having difficulty with frequent gas and abdominal bloating and has noticed a change in her bowel habits. She had been here remotely in 2008 and had undergone EGD with Dr. Carlean Purl.  At that time she was having GERD symptoms.  She was noted to have some erythema and vascular prominence in the distal esophagus and some mild fissuring otherwise normal exam.  Biopsies showed mild chronic inflammation of the esophagus no increased eosinophils and no intestinal metaplasia. Patient says that she has taken a PPI in the past had taken Aciphex for a while which did help with her symptoms but did not alleviate the nausea.  She is frustrated now because she has been having ongoing nausea every day and cannot sort out what is causing the nausea.  She says it does not seem to matter whether she eats or not, sometimes has worse symptoms after eating and sometimes has symptoms on an empty stomach.  She has not recently been on a PPI but had just started famotidine 40 mg daily recently.  Given she never has any vomiting, she has no complaints of dysphagia or odynophagia, no heartburn or indigestion.  She has figured out that she is lactose intolerant and says that most dairy consumption will cause gassiness bloating and loose stools over the past year.  She thinks she is able to eat small amounts of cheese without difficulty. As far as her bowel habits are concerned she says she usually has an urge for bowel movement but frequently has to sit on the commode for 30 minutes to have a bowel movement.  She does have some straining but says that it is not excessive and is not having hard stools but rather pasty or mushy  stools.  She says she has read online that perhaps this could mean that she has pancreas problems. Appetite is fair, weight is stable. She has not started any other new medicines recently, not using any NSAIDs.  She denies cannabis use. She recently had labs 11/29/2022 H. pylori IgG negative, c-Met unremarkable lipase within normal limits and CBC showed hemoglobin 11.6/hematocrit 35.4 and MCV of 84.6. She is scheduled for an upper abdominal ultrasound but says she was wondering about canceling that because she is interested in having an endoscopy and a colonoscopy.    Review of Systems Pertinent positive and negative review of systems were noted in the above HPI section.  All other review of systems was otherwise negative.   Outpatient Encounter Medications as of 12/16/2022  Medication Sig   Cholecalciferol (VITAMIN D3) 50 MCG (2000 UT) capsule Take 2 capsules (4,000 Units total) by mouth daily.   etonogestrel-ethinyl estradiol (NUVARING) 0.12-0.015 MG/24HR vaginal ring Insert vaginally and leave in place for 3 consecutive weeks, then remove for 1 week.   famotidine (PEPCID) 40 MG tablet Take 1 tablet (40 mg total) by mouth daily.   PEG-KCl-NaCl-NaSulf-Na Asc-C (PLENVU) 140 g SOLR Take 1 kit by mouth once for 1 dose.   polyethylene glycol powder (GLYCOLAX/MIRALAX) 17 GM/SCOOP powder Take 17 g by mouth daily as needed.   Prenatal-DSS-FeCb-FeGl-FA (CITRANATAL BLOOM) 90-1 MG TABS Take 1 tablet by mouth daily before breakfast.  Vitamin D, Ergocalciferol, (DRISDOL) 1.25 MG (50000 UNIT) CAPS capsule Take 1 capsule (50,000 Units total) by mouth every 7 (seven) days.   Cholecalciferol (VITAMIN D3) 50 MCG (2000 UT) capsule Take 1 capsule (2,000 Units total) by mouth daily. (Patient not taking: Reported on 12/16/2022)   No facility-administered encounter medications on file as of 12/16/2022.   No Known Allergies Patient Active Problem List   Diagnosis Date Noted   Adenopathy, cervical 03/14/2022    Pharyngitis 12/20/2021   Well adult exam 03/31/2020   Suspected COVID-19 virus infection 05/14/2019   Chest pain, atypical 05/04/2018   Iron deficiency anemia due to chronic blood loss 09/16/2016   Menorrhagia 09/16/2016   Stung by yellow jacket 06/24/2015   Fracture of radius, distal, right, closed 03/20/2015   Dizziness 11/19/2013   Rash, skin 07/22/2011   Constipation 12/17/2010   Vitamin D deficiency 09/10/2010   Alopecia 09/10/2010   Iron deficiency anemia 11/16/2009   Nausea 08/05/2009   EPIGASTRIC PAIN 08/05/2009   Fatigue 04/24/2008   Allergic rhinitis 12/02/2007   ANXIETY 07/09/2007   GERD 07/06/2007   HEART MURMUR, HX OF 07/06/2007   Social History   Socioeconomic History   Marital status: Single    Spouse name: Not on file   Number of children: 1   Years of education: Not on file   Highest education level: Not on file  Occupational History   Occupation: wants to go to [employer] school, in college now  Tobacco Use   Smoking status: Never    Passive exposure: Never   Smokeless tobacco: Never  Vaping Use   Vaping Use: Never used  Substance and Sexual Activity   Alcohol use: Yes    Comment: occasionally   Drug use: No   Sexual activity: Yes    Partners: Male    Birth control/protection: None  Other Topics Concern   Not on file  Social History Narrative   One child.  Horse back riding.     Social Determinants of Health   Financial Resource Strain: Not on file  Food Insecurity: Not on file  Transportation Needs: Not on file  Physical Activity: Not on file  Stress: Not on file  Social Connections: Not on file  Intimate Partner Violence: Not on file    Ms. Criss's family history includes Diabetes in her paternal grandmother; Heart failure in her paternal grandmother.      Objective:    Vitals:   12/16/22 0907  BP: 118/68  Pulse: 78  SpO2: 98%    Physical Exam Well-developed well-nourished AA female  in no acute distress.  Height, Weight,  193 BMI37.6  HEENT; nontraumatic normocephalic, EOMI, PE R LA, sclera anicteric. Oropharynx;not done today  Neck; supple, no JVD Cardiovascular; regular rate and rhythm with S1-S2, no murmur rub or gallop Pulmonary; Clear bilaterally Abdomen; soft, nontender, nondistended, no palpable mass or hepatosplenomegaly, bowel sounds are active Rectal; Skin; benign exam, no jaundice rash or appreciable lesions Extremities; no clubbing cyanosis or edema skin warm and dry Neuro/Psych; alert and oriented x4, grossly nonfocal mood and affect appropriate        Assessment & Plan:   #75 37 year old African-American female with chronic nausea which has been present for years and occurs on most days, sometimes worse with an empty stomach and sometimes worse after eating. Patient relates that PPI had helped in the past but never alleviated symptoms.  She has just started Pepcid 40 mg p.o. daily.  Etiology is not clear-  #2 lactose  intolerance-with abdominal gas bloating and loose stools with lactose  consumption-she has cut out most lactose but continues to use some cheese and sometimes cooks with some lactose. I think most of her gas and bloating is secondary to lactose intolerance, could have component of IBS  #3 change in bowel habits over the past year with pasty mushy stools, she is able to have a bowel movement every day but has to sit for a long time to produce a bowel movement  #4 history of iron deficiency anemia in the past felt secondary to menorrhagia #5.  Anxiety  Plan; patient has not been taking the prescribed famotidine, says she has been taking omeprazole over-the-counter daily which is helping some so we will continue that. We discussed importance of more strict lactose-free diet to avoid symptoms.  We also discussed use of Lactaid 1-2 with meals Would like her to keep the appointment for upper abdominal ultrasound Add Benefiber 1 dose daily in a glass of water to bulk stool Check  fecal elastase Patient will be scheduled for EGD and colonoscopy with Dr. Carlean Purl.  Procedures were discussed with the patient in detail including indications risk and benefits and she is agreeable to proceed.  Daurice Ovando S Clydell Alberts PA-C 12/16/2022   Cc: Plotnikov, Evie Lacks, MD

## 2022-12-19 ENCOUNTER — Other Ambulatory Visit: Payer: 59

## 2022-12-19 DIAGNOSIS — R109 Unspecified abdominal pain: Secondary | ICD-10-CM

## 2022-12-19 DIAGNOSIS — R195 Other fecal abnormalities: Secondary | ICD-10-CM

## 2022-12-19 DIAGNOSIS — E739 Lactose intolerance, unspecified: Secondary | ICD-10-CM

## 2022-12-19 DIAGNOSIS — R140 Abdominal distension (gaseous): Secondary | ICD-10-CM

## 2022-12-19 DIAGNOSIS — R110 Nausea: Secondary | ICD-10-CM

## 2022-12-19 DIAGNOSIS — R194 Change in bowel habit: Secondary | ICD-10-CM

## 2022-12-26 ENCOUNTER — Telehealth: Payer: Self-pay | Admitting: Physician Assistant

## 2022-12-26 LAB — PANCREATIC ELASTASE, FECAL: Pancreatic Elastase-1, Stool: >500 mcg/g

## 2022-12-26 NOTE — Telephone Encounter (Signed)
The pt has been advised that results have not yet resulted.  She will be contacted as soon as able.

## 2022-12-26 NOTE — Telephone Encounter (Signed)
Inbound call from patient requesting a call back to discuss if her stool sample results had came back. Please advise.

## 2022-12-28 ENCOUNTER — Ambulatory Visit
Admission: RE | Admit: 2022-12-28 | Discharge: 2022-12-28 | Disposition: A | Discharge status: Home / Self Care | Payer: 59 | Source: Ambulatory Visit | Attending: Internal Medicine | Admitting: Internal Medicine

## 2022-12-28 DIAGNOSIS — R110 Nausea: Secondary | ICD-10-CM

## 2022-12-28 DIAGNOSIS — R1084 Generalized abdominal pain: Secondary | ICD-10-CM

## 2023-01-06 DIAGNOSIS — Z419 Encounter for procedure for purposes other than remedying health state, unspecified: Secondary | ICD-10-CM | POA: Diagnosis not present

## 2023-01-10 ENCOUNTER — Encounter: Payer: Self-pay | Admitting: Family

## 2023-01-18 ENCOUNTER — Ambulatory Visit: Payer: Medicaid Other | Admitting: Internal Medicine

## 2023-01-18 ENCOUNTER — Encounter: Payer: Self-pay | Admitting: Internal Medicine

## 2023-01-18 VITALS — BP 114/75 | HR 92 | Temp 97.1°F | Resp 12 | Ht 60.0 in | Wt 193.0 lb

## 2023-01-18 DIAGNOSIS — R194 Change in bowel habit: Secondary | ICD-10-CM | POA: Diagnosis not present

## 2023-01-18 DIAGNOSIS — R110 Nausea: Secondary | ICD-10-CM

## 2023-01-18 DIAGNOSIS — R140 Abdominal distension (gaseous): Secondary | ICD-10-CM

## 2023-01-18 DIAGNOSIS — E669 Obesity, unspecified: Secondary | ICD-10-CM | POA: Diagnosis not present

## 2023-01-18 DIAGNOSIS — D500 Iron deficiency anemia secondary to blood loss (chronic): Secondary | ICD-10-CM | POA: Diagnosis not present

## 2023-01-18 HISTORY — PX: COLONOSCOPY WITH ESOPHAGOGASTRODUODENOSCOPY (EGD): SHX5779

## 2023-01-18 MED ORDER — SODIUM CHLORIDE 0.9 % IV SOLN: 500.0000 mL | Freq: Once | INTRAVENOUS | Status: DC

## 2023-01-18 NOTE — Progress Notes (Signed)
Uneventful anesthetic. Report to pacu rn. Vss. Care resumed by rn. 

## 2023-01-18 NOTE — Progress Notes (Signed)
Pt's states no medical or surgical changes since previsit or office visit. VS assessed by E.C 

## 2023-01-18 NOTE — Progress Notes (Signed)
Dr Carlean Purl will have his office personel make pt a follow up appoint since there are no appointments showing up until May

## 2023-01-18 NOTE — Op Note (Signed)
[facility] Patient Name: [name] Procedure Date: 01/18/2023 2:08 PM MRN: [id number] Endoscopist: Gatha Mayer , MD, [id number] Age: 37 Referring MD:  Date of Birth: March 04, 1986 Gender: Female Account #: [id number] Procedure:                Colonoscopy Indications:              Iron deficiency anemia secondary to chronic blood                            loss, Incidental change in bowel habits noted Medicines:                Monitored Anesthesia Care Procedure:                Pre-Anesthesia Assessment:                           - Prior to the procedure, a History and Physical                            was performed, and patient medications and                            allergies were reviewed. The patient's tolerance of                            previous anesthesia was also reviewed. The risks                            and benefits of the procedure and the sedation                            options and risks were discussed with the patient.                            All questions were answered, and informed consent                            was obtained. Prior Anticoagulants: The patient has                            taken no anticoagulant or antiplatelet agents. ASA                            Grade Assessment: II - A patient with mild systemic                            disease. After reviewing the risks and benefits,                            the patient was deemed in satisfactory condition to                            undergo the procedure.  After obtaining informed consent, the colonoscope                            was passed under direct vision. Throughout the                            procedure, the patient's blood pressure, pulse, and                            oxygen saturations were monitored continuously. The                            Olympus [id number] ([id number]) Colonoscope was                            introduced through  the anus and advanced to the the                            terminal ileum, with identification of the                            appendiceal orifice and IC valve. The colonoscopy                            was performed without difficulty. The patient                            tolerated the procedure well. The quality of the                            bowel preparation was excellent. The terminal                            ileum, ileocecal valve, appendiceal orifice, and                            rectum were photographed. Scope In: 2:18:48 PM Scope Out: 2:26:53 PM Scope Withdrawal Time: 0 hours 6 minutes 19 seconds  Total Procedure Duration: 0 hours 8 minutes 5 seconds  Findings:                 The perianal and digital rectal examinations were                            normal.                           The terminal ileum appeared normal.                           The entire examined colon appeared normal on direct                            and retroflexion views. Complications:            No immediate complications. Estimated Blood Loss:  Estimated blood loss: none. Impression:               - The examined portion of the ileum was normal.                           - The entire examined colon is normal on direct and                            retroflexion views.                           - No specimens collected. Recommendation:           - Patient has a contact number available for                            emergencies. The signs and symptoms of potential                            delayed complications were discussed with the                            patient. Return to normal activities tomorrow.                            Written discharge instructions were provided to the                            patient.                           - Resume previous diet.                           - Continue present medications.                           - Repeat colonoscopy in 10 years  for screening                            purposes.                           Will have her return to office and review, plan                            treatment - suspect functional GI disturbances Gatha Mayer, MD 01/18/2023 2:36:29 PM This report has been signed electronically.

## 2023-01-18 NOTE — Op Note (Signed)
[facility] Patient Name: [name] Procedure Date: 01/18/2023 2:09 PM MRN: [id number] Endoscopist: Gatha Mayer , MD, [id number] Age: 37 Referring MD:  Date of Birth: 08-28-1986 Gender: Female Account #: [id number] Procedure:                Upper GI endoscopy Indications:              Iron deficiency anemia secondary to chronic blood                            loss, Nausea Medicines:                Monitored Anesthesia Care Procedure:                Pre-Anesthesia Assessment:                           - Prior to the procedure, a History and Physical                            was performed, and patient medications and                            allergies were reviewed. The patient's tolerance of                            previous anesthesia was also reviewed. The risks                            and benefits of the procedure and the sedation                            options and risks were discussed with the patient.                            All questions were answered, and informed consent                            was obtained. Prior Anticoagulants: The patient has                            taken no anticoagulant or antiplatelet agents. ASA                            Grade Assessment: II - A patient with mild systemic                            disease. After reviewing the risks and benefits,                            the patient was deemed in satisfactory condition to                            undergo the procedure.  After obtaining informed consent, the endoscope was                            passed under direct vision. Throughout the                            procedure, the patient's blood pressure, pulse, and                            oxygen saturations were monitored continuously. The                            Olympus Scope [id number] was introduced through the                            mouth, and advanced to the second part of  duodenum.                            The upper GI endoscopy was accomplished without                            difficulty. The patient tolerated the procedure                            well. Scope In: Scope Out: Findings:                 The esophagus was normal.                           The stomach was normal.                           The examined duodenum was normal.                           The cardia and gastric fundus were normal on                            retroflexion. Complications:            No immediate complications. Estimated Blood Loss:     Estimated blood loss: none. Impression:               - Normal esophagus.                           - Normal stomach.                           - Normal examined duodenum.                           - No specimens collected. Recommendation:           - Patient has a contact number available for  emergencies. The signs and symptoms of potential                            delayed complications were discussed with the                            patient. Return to normal activities tomorrow.                            Written discharge instructions were provided to the                            patient.                           - See the other procedure note for documentation of                            additional recommendations. No caiuse of nausea                            seen here. Probably a functional d/o Gatha Mayer, MD 01/18/2023 2:34:35 PM This report has been signed electronically.

## 2023-01-18 NOTE — Patient Instructions (Addendum)
Both procedures were normal.  Cause of your problems not clear but no signs of any bad problems occurring. Sometimes anxiety or stress is the cause of nausea like you have. Bowel movement problems may be playing a role also.  I have some ideas on how to help but we need to do that in an office visit - will make a follow-up appointment for you.  I appreciate the opportunity to care for you. Gatha Mayer, MD, [facility]  Make appointment to see Dr Carlean Purl in office for a follow up - his office will call you, but ok for you to call and make this appointment      YOU HAD AN ENDOSCOPIC PROCEDURE TODAY AT [facility]:   Refer to the procedure report that was given to you for any specific questions about what was found during the examination.  If the procedure report does not answer your questions, please call your gastroenterologist to clarify.  If you requested that your care partner not be given the details of your procedure findings, then the procedure report has been included in a sealed envelope for you to review at your convenience later.  YOU SHOULD EXPECT: Some feelings of bloating in the abdomen. Passage of more gas than usual.  Walking can help get rid of the air that was put into your GI tract during the procedure and reduce the bloating. If you had a lower endoscopy (such as a colonoscopy or flexible sigmoidoscopy) you may notice spotting of blood in your stool or on the toilet paper. If you underwent a bowel prep for your procedure, you may not have a normal bowel movement for a few days.  Please Note:  You might notice some irritation and congestion in your nose or some drainage.  This is from the oxygen used during your procedure.  There is no need for concern and it should clear up in a day or so.  SYMPTOMS TO REPORT IMMEDIATELY:  Following lower endoscopy (colonoscopy or flexible sigmoidoscopy):  Excessive amounts of blood in the stool  Significant tenderness or  worsening of abdominal pains  Swelling of the abdomen that is new, acute  Fever of 100F or higher  Following upper endoscopy (EGD)  Vomiting of blood or coffee ground material  New chest pain or pain under the shoulder blades  Painful or persistently difficult swallowing  New shortness of breath  Fever of 100F or higher  Black, tarry-looking stools  For urgent or emergent issues, a gastroenterologist can be reached at any hour by calling [phone]. Do not use MyChart messaging for urgent concerns.    DIET:  We do recommend a small meal at first, but then you may proceed to your regular diet.  Drink plenty of fluids but you should avoid alcoholic beverages for 24 hours.  ACTIVITY:  You should plan to take it easy for the rest of today and you should NOT DRIVE or use heavy machinery until tomorrow (because of the sedation medicines used during the test).    FOLLOW UP: Our staff will call the number listed on your records the next business day following your procedure.  We will call around 7:15- 8:00 am to check on you and address any questions or concerns that you may have regarding the information given to you following your procedure. If we do not reach you, we will leave a message.     If any biopsies were taken you will be contacted by phone or by  letter within the next 1-3 weeks.  Please call us at [phone] if you have not heard about the biopsies in 3 weeks.    SIGNATURES/CONFIDENTIALITY: You and/or your care partner have signed paperwork which will be entered into your electronic medical record.  These signatures attest to the fact that that the information above on your After Visit Summary has been reviewed and is understood.  Full responsibility of the confidentiality of this discharge information lies with you and/or your care-partner.

## 2023-01-18 NOTE — Progress Notes (Signed)
[facility] History and Physical   Primary Care Physician:  Cassandria Anger, MD   Reason for Procedure:   Nausea. Iron deficiency anemia, altered bowel habits + bloating  Plan:    EGD + colonoscopy\     HPI: [name] is a 37 y.o. female seen 2/9/.24 w/ following complaints - she also has iron deficiency anemia - probably due to menorrhagia  [name] is a 37 year old African-American female, who comes in today with complaints of chronic nausea which has been present for years and occurs most days.  She is referred by Dr. Alain Marion. She has also been having difficulty with frequent gas and abdominal bloating and has noticed a change in her bowel habits. She had been here remotely in 2008 and had undergone EGD with Dr. Carlean Purl.  At that time she was having GERD symptoms.  She was noted to have some erythema and vascular prominence in the distal esophagus and some mild fissuring otherwise normal exam.  Biopsies showed mild chronic inflammation of the esophagus no increased eosinophils and no intestinal metaplasia. Patient says that she has taken a PPI in the past had taken Aciphex for a while which did help with her symptoms but did not alleviate the nausea.  She is frustrated now because she has been having ongoing nausea every day and cannot sort out what is causing the nausea.  She says it does not seem to matter whether she eats or not, sometimes has worse symptoms after eating and sometimes has symptoms on an empty stomach.  She has not recently been on a PPI but had just started famotidine 40 mg daily recently.  Given she never has any vomiting, she has no complaints of dysphagia or odynophagia, no heartburn or indigestion.  She has figured out that she is lactose intolerant and says that most dairy consumption will cause gassiness bloating and loose stools over the past year.  She thinks she is able to eat small amounts of cheese without difficulty. As far as her bowel habits  are concerned she says she usually has an urge for bowel movement but frequently has to sit on the commode for 30 minutes to have a bowel movement.  She does have some straining but says that it is not excessive and is not having hard stools but rather pasty or mushy stools.  She says she has read online that perhaps this could mean that she has pancreas problems. Appetite is fair, weight is stable. She has not started any other new medicines recently, not using any NSAIDs.  She denies cannabis use. She recently had labs 11/29/2022 H. pylori IgG negative, c-Met unremarkable lipase within normal limits and CBC showed hemoglobin 11.6/hematocrit 35.4 and MCV of 84.6. She is scheduled for an upper abdominal ultrasound but says she was wondering about canceling that because she is interested in having an endoscopy and a colonoscopy. Past Medical History:  Diagnosis Date   Anemia    no current med.   Fracture of radius, distal, right, closed 03/20/2015   GERD (gastroesophageal reflux disease)    OTC as needed   History of cardiac murmur    states workup 2013 - no problems identified   Obesity    Radius fracture 03/16/2015   right    Past Surgical History:  Procedure Laterality Date   CESAREAN SECTION  03/15/2006   OPEN REDUCTION INTERNAL FIXATION (ORIF) DISTAL RADIAL FRACTURE Right 03/20/2015   Procedure: OPEN REDUCTION INTERNAL FIXATION RIGHT DISTAL RADIUS;  Surgeon: Vonna Kotyk  Mardelle Matte, MD;  Location: [facility];  Service: Orthopedics;  Laterality: Right;    Prior to Admission medications   Medication Sig Start Date End Date Taking? Authorizing Provider  Cholecalciferol (VITAMIN D3) 50 MCG (2000 UT) capsule Take 2 capsules (4,000 Units total) by mouth daily. 12/01/22   Plotnikov, Evie Lacks, MD  etonogestrel-ethinyl estradiol (NUVARING) 0.12-0.015 MG/24HR vaginal ring Insert vaginally and leave in place for 3 consecutive weeks, then remove for 1 week. Patient not taking: Reported on  01/18/2023 09/09/19   Shelly Bombard, MD  famotidine (PEPCID) 40 MG tablet Take 1 tablet (40 mg total) by mouth daily. Patient not taking: Reported on 01/18/2023 11/29/22   Plotnikov, Evie Lacks, MD  polyethylene glycol powder (GLYCOLAX/MIRALAX) 17 GM/SCOOP powder Take 17 g by mouth daily as needed. Patient not taking: Reported on 01/18/2023 11/29/22   Plotnikov, Evie Lacks, MD  Prenatal-DSS-FeCb-FeGl-FA (CITRANATAL BLOOM) 90-1 MG TABS Take 1 tablet by mouth daily before breakfast. Patient not taking: Reported on 01/18/2023 09/13/22   Shelly Bombard, MD  Vitamin D, Ergocalciferol, (DRISDOL) 1.25 MG (50000 UNIT) CAPS capsule Take 1 capsule (50,000 Units total) by mouth every 7 (seven) days. 12/01/22   Plotnikov, Evie Lacks, MD    Current Outpatient Medications  Medication Sig Dispense Refill   Cholecalciferol (VITAMIN D3) 50 MCG (2000 UT) capsule Take 2 capsules (4,000 Units total) by mouth daily. 180 capsule 3   etonogestrel-ethinyl estradiol (NUVARING) 0.12-0.015 MG/24HR vaginal ring Insert vaginally and leave in place for 3 consecutive weeks, then remove for 1 week. (Patient not taking: Reported on 01/18/2023) 1 each 12   famotidine (PEPCID) 40 MG tablet Take 1 tablet (40 mg total) by mouth daily. (Patient not taking: Reported on 01/18/2023) 90 tablet 3   polyethylene glycol powder (GLYCOLAX/MIRALAX) 17 GM/SCOOP powder Take 17 g by mouth daily as needed. (Patient not taking: Reported on 01/18/2023) 500 g 6   Prenatal-DSS-FeCb-FeGl-FA (CITRANATAL BLOOM) 90-1 MG TABS Take 1 tablet by mouth daily before breakfast. (Patient not taking: Reported on 01/18/2023) 30 tablet 11   Vitamin D, Ergocalciferol, (DRISDOL) 1.25 MG (50000 UNIT) CAPS capsule Take 1 capsule (50,000 Units total) by mouth every 7 (seven) days. 8 capsule 0   Current Facility-Administered Medications  Medication Dose Route Frequency Provider Last Rate Last Admin   0.9 %  sodium chloride infusion  500 mL Intravenous Once Gatha Mayer, MD         Allergies as of 01/18/2023   (No Known Allergies)    Family History  Problem Relation Age of Onset   Heart failure Paternal Grandmother    Diabetes Paternal Grandmother    Esophageal cancer Neg Hx    Colon cancer Neg Hx    Liver disease Neg Hx     Social History   Socioeconomic History   Marital status: Single    Spouse name: Not on file   Number of children: 1   Years of education: Not on file   Highest education level: Not on file  Occupational History   Occupation: wants to go to [employer] school, in college now  Tobacco Use   Smoking status: Never    Passive exposure: Never   Smokeless tobacco: Never  Vaping Use   Vaping Use: Never used  Substance and Sexual Activity   Alcohol use: Yes    Comment: occasionally   Drug use: No   Sexual activity: Yes    Partners: Male    Birth control/protection: None  Other Topics Concern  Not on file  Social History Narrative   One child.  Horse back riding.     Social Determinants of Health   Financial Resource Strain: Not on file  Food Insecurity: Not on file  Transportation Needs: Not on file  Physical Activity: Not on file  Stress: Not on file  Social Connections: Not on file  Intimate Partner Violence: Not on file    Review of Systems:  All other review of systems negative except as mentioned in the HPI.  Physical Exam: Vital signs BP 123/75   Pulse 98   Temp (!) 97.1 F (36.2 C) (Skin)   Ht 5' (1.524 m)   Wt 193 lb (87.5 kg)   SpO2 100%   BMI 37.69 kg/m   General:   Alert,  Well-developed, well-nourished, pleasant and cooperative in NAD Lungs:  Clear throughout to auscultation.   Heart:  Regular rate and rhythm; no murmurs, clicks, rubs,  or gallops. Abdomen:  Soft, nontender and nondistended. Normal bowel sounds.   Neuro/Psych:  Alert and cooperative. Normal mood and affect. A and O x 3   '@Shariece Viveiros'$  Simonne Maffucci, MD, [facility] [phone] (pager) 01/18/2023 2:07 PM@

## 2023-01-19 ENCOUNTER — Telehealth: Payer: Self-pay | Admitting: Internal Medicine

## 2023-01-19 ENCOUNTER — Telehealth: Payer: Self-pay

## 2023-01-19 NOTE — Telephone Encounter (Signed)
Please schedule her to see me in RN visit available in April, please 9or an [phone] or [phone]  Saving banding spots for now

## 2023-01-19 NOTE — Telephone Encounter (Signed)
  Follow up Call-     01/18/2023    1:27 PM  Call back number  Post procedure Call Back phone  # [phone]  Permission to leave phone message Yes     Patient questions:  Do you have a fever, pain , or abdominal swelling? No. Pain Score  0 *  Have you tolerated food without any problems? Yes.    Have you been able to return to your normal activities? Yes.    Do you have any questions about your discharge instructions: Diet   No. Medications  No. Follow up visit  No.  Do you have questions or concerns about your Care? No.  Actions: * If pain score is 4 or above: No action needed, pain <4.

## 2023-01-20 NOTE — Telephone Encounter (Signed)
Left her a message to call me back. 

## 2023-01-23 ENCOUNTER — Telehealth: Payer: Self-pay

## 2023-01-23 NOTE — Telephone Encounter (Signed)
Pt notified of Dr. Carlean Purl recommendations:  Pt was scheduled for an office visit on 02/16/2023 at 9:10 AM. Pt made aware.  Pt verbalized understanding with all questions answered.

## 2023-01-23 NOTE — Telephone Encounter (Addendum)
Pt was contacted in regard to Colonoscopy recommendations: "Will have her return to office and review, plan treatment- suspect functional GI disturbances" Left message for pt to call back.

## 2023-01-24 NOTE — Telephone Encounter (Signed)
She made an appointment on 02/16/2023.

## 2023-02-06 DIAGNOSIS — Z419 Encounter for procedure for purposes other than remedying health state, unspecified: Secondary | ICD-10-CM | POA: Diagnosis not present

## 2023-02-16 ENCOUNTER — Encounter: Payer: Self-pay | Admitting: Internal Medicine

## 2023-02-16 ENCOUNTER — Ambulatory Visit (INDEPENDENT_AMBULATORY_CARE_PROVIDER_SITE_OTHER): Payer: Medicaid Other | Admitting: Internal Medicine

## 2023-02-16 VITALS — BP 136/80 | HR 75 | Ht 61.0 in | Wt 194.0 lb

## 2023-02-16 DIAGNOSIS — K30 Functional dyspepsia: Secondary | ICD-10-CM

## 2023-02-16 DIAGNOSIS — R110 Nausea: Secondary | ICD-10-CM

## 2023-02-16 DIAGNOSIS — R140 Abdominal distension (gaseous): Secondary | ICD-10-CM

## 2023-02-16 MED ORDER — BUSPIRONE HCL 10 MG PO TABS: 10.0000 mg | ORAL_TABLET | Freq: Two times a day (BID) | ORAL | 2 refills | Status: DC

## 2023-02-16 NOTE — Progress Notes (Signed)
[name] 36 y.o. December 28, 1985 [id number]  Assessment & Plan:   Encounter Diagnoses  Name Primary?   Chronic nausea Yes   Functional dyspepsia    Bloating    I think she has a functional GI disturbance.  Clinical scenario duration and other features go against serious medical illness.  Lack of response to PPI goes against GERD, I do not think that her issue.  Treatment plan as below: Meds ordered this encounter  Medications   busPIRone (BUSPAR) 10 MG tablet    Sig: Take 1 tablet (10 mg total) by mouth 2 (two) times daily. Take 1/2 tablet 2 times a day for first week    Dispense:  60 tablet    Refill:  2    She is reassured today against serious medical illness and given some sleep issues have given her instructions regarding box breathing to try to help induce better sleep and maybe help with anxiety which seems to be a background problem.  04/21/2023 follow-up with me   CC: [name], Courtney Quint, MD  Subjective:   Chief Complaint: Nausea and bloating  HPI 37 year old white woman with a long history of nausea symptoms and some possible GERD symptoms, had been seen in 2011 and then more recently and underwent an EGD and a colonoscopy 01/18/2023 with no significant findings.  She has had some history of iron deficiency anemia that we think is related to menorrhagia and there were no sources seen at her endoscopic evaluations.  She returns complaining of persistent nausea that is present most days.  It can be triggered by eating as well as some abdominal distention/bloating and early satiety complaints.  PPI therapy has not helped her.  She does have a history of anxiety by report.  She says she has felt this way ever since she delivered her son 40 almost 18 years ago with respect to nausea and the bloating.  Defecation is normal.  She is not vomiting.  She does not have other symptoms like headaches etc.  She has some anxiety she sleeps about 6 hours a night but says her mind  races before she goes to sleep.  She has been somewhat concerned that there may be a serious health problem but was reassured by the recent EGD and colonoscopy.  She also had a negative abdominal ultrasound in February. No Known Allergies Current Meds  None        Past Medical History:  Diagnosis Date   Anemia    no current med.   Fracture of radius, distal, right, closed 03/20/2015   GERD (gastroesophageal reflux disease)    OTC as needed   History of cardiac murmur    states workup 2013 - no problems identified   Obesity    Radius fracture 03/16/2015   right   Past Surgical History:  Procedure Laterality Date   CESAREAN SECTION  03/15/2006   COLONOSCOPY WITH ESOPHAGOGASTRODUODENOSCOPY (EGD)  01/18/2023   OPEN REDUCTION INTERNAL FIXATION (ORIF) DISTAL RADIAL FRACTURE Right 03/20/2015   Procedure: OPEN REDUCTION INTERNAL FIXATION RIGHT DISTAL RADIUS;  Surgeon: Teryl Lucy, MD;  Location: [facility];  Service: Orthopedics;  Laterality: Right;   Social History   Social History Narrative   One child.  Horse back riding.     family history includes Diabetes in her paternal grandmother; Heart failure in her paternal grandmother.   Review of Systems As per HPI  Objective:   Physical Exam BP 136/80   Pulse 75  Ht 5\' 1"  (1.549 m)   Wt 194 lb (88 kg)   BMI 36.66 kg/m

## 2023-02-16 NOTE — Patient Instructions (Addendum)
Try the buspirone as we discussed - hope it helps. It has helped many of my patients. I do not think any serious health issue going on.  Box breathing to relax and reduce anxiety - may be really good at bedtime  Inhale to a count of 4; hold that 4 count; exhale over 4 count; hold exhale x 4  Then repeat this - a few times   I appreciate the opportunity to care for you. Iva Boop, MD, [facility]

## 2023-03-02 ENCOUNTER — Other Ambulatory Visit: Payer: Self-pay | Admitting: Internal Medicine

## 2023-03-08 DIAGNOSIS — Z419 Encounter for procedure for purposes other than remedying health state, unspecified: Secondary | ICD-10-CM | POA: Diagnosis not present

## 2023-04-08 DIAGNOSIS — Z419 Encounter for procedure for purposes other than remedying health state, unspecified: Secondary | ICD-10-CM | POA: Diagnosis not present

## 2023-04-21 ENCOUNTER — Ambulatory Visit: Payer: Medicaid Other | Admitting: Internal Medicine

## 2023-05-08 DIAGNOSIS — Z419 Encounter for procedure for purposes other than remedying health state, unspecified: Secondary | ICD-10-CM | POA: Diagnosis not present

## 2023-06-06 ENCOUNTER — Inpatient Hospital Stay (HOSPITAL_BASED_OUTPATIENT_CLINIC_OR_DEPARTMENT_OTHER): Payer: Medicaid Other | Admitting: Medical Oncology

## 2023-06-06 ENCOUNTER — Inpatient Hospital Stay: Payer: Medicaid Other | Attending: Family

## 2023-06-06 ENCOUNTER — Encounter: Payer: Self-pay | Admitting: Medical Oncology

## 2023-06-06 ENCOUNTER — Other Ambulatory Visit: Payer: Self-pay

## 2023-06-06 VITALS — BP 120/83 | HR 70 | Temp 98.1°F | Resp 18 | Ht 61.0 in | Wt 191.1 lb

## 2023-06-06 DIAGNOSIS — D500 Iron deficiency anemia secondary to blood loss (chronic): Secondary | ICD-10-CM | POA: Diagnosis not present

## 2023-06-06 DIAGNOSIS — Z79899 Other long term (current) drug therapy: Secondary | ICD-10-CM | POA: Diagnosis not present

## 2023-06-06 DIAGNOSIS — N920 Excessive and frequent menstruation with regular cycle: Secondary | ICD-10-CM

## 2023-06-06 LAB — CBC WITH DIFFERENTIAL (CANCER CENTER ONLY)
Abs Immature Granulocytes: 0.01 10*3/uL (ref 0.00–0.07)
Basophils Absolute: 0 10*3/uL (ref 0.0–0.1)
Basophils Relative: 1 %
Eosinophils Absolute: 0.2 10*3/uL (ref 0.0–0.5)
Eosinophils Relative: 3 %
HCT: 34.2 % — ABNORMAL LOW (ref 36.0–46.0)
Hemoglobin: 11 g/dL — ABNORMAL LOW (ref 12.0–15.0)
Immature Granulocytes: 0 %
Lymphocytes Relative: 41 %
Lymphs Abs: 3.3 10*3/uL (ref 0.7–4.0)
MCH: 26.6 pg (ref 26.0–34.0)
MCHC: 32.2 g/dL (ref 30.0–36.0)
MCV: 82.6 fL (ref 80.0–100.0)
Monocytes Absolute: 0.6 10*3/uL (ref 0.1–1.0)
Monocytes Relative: 7 %
Neutro Abs: 3.9 10*3/uL (ref 1.7–7.7)
Neutrophils Relative %: 48 %
Platelet Count: 323 10*3/uL (ref 150–400)
RBC: 4.14 MIL/uL (ref 3.87–5.11)
RDW: 13.5 % (ref 11.5–15.5)
WBC Count: 8 10*3/uL (ref 4.0–10.5)
nRBC: 0 % (ref 0.0–0.2)

## 2023-06-06 LAB — FERRITIN: Ferritin: 23 ng/mL (ref 11–307)

## 2023-06-06 LAB — RETICULOCYTES
Immature Retic Fract: 13.3 % (ref 2.3–15.9)
RBC.: 4.14 MIL/uL (ref 3.87–5.11)
Retic Count, Absolute: 84 10*3/uL (ref 19.0–186.0)
Retic Ct Pct: 2 % (ref 0.4–3.1)

## 2023-06-06 LAB — IRON AND IRON BINDING CAPACITY (CC-WL,HP ONLY)
Iron: 28 ug/dL (ref 28–170)
Saturation Ratios: 7 % — ABNORMAL LOW (ref 10.4–31.8)
TIBC: 391 ug/dL (ref 250–450)
UIBC: 363 ug/dL (ref 148–442)

## 2023-06-06 NOTE — Progress Notes (Signed)
Hematology and Oncology Follow Up Visit  [name] [id number] [birth year] 37 y.o. 06/06/2023   Principle Diagnosis:  Iron deficiency anemia secondary to chronic blood loss due to menorrhagia   Current Therapy:        IV iron as indicated    Interim History:  [name] is here today for follow-up. She reports that she has been feeling well overall.    Her cycle is regular with heavy flow. Day 2-3 are the most heavy days of cycle.   No other blood loss noted. No bruising or petechiae.  No fever, chills, n/v, cough, rash, SOB, chest pain, abdominal pain or changes in bowel or bladder habits.  No swelling, tenderness, numbness or tingling in her extremities.  No falls or syncope.  Appetite is good. She admits that she needs to better hydrate throughout the day. Weight is stable  ECOG Performance Status: 1 - Symptomatic but completely ambulatory  Medications:  Allergies as of 06/06/2023   No Known Allergies      Medication List        Accurate as of June 06, 2023 10:43 AM. If you have any questions, ask your nurse or doctor.          STOP taking these medications    busPIRone 10 MG tablet Commonly known as: BUSPAR Stopped by: [name]       TAKE these medications    CitraNatal Bloom 90-1 MG Tabs Take 1 tablet by mouth daily before breakfast.   etonogestrel-ethinyl estradiol 0.12-0.015 MG/24HR vaginal ring Commonly known as: NUVARING Insert vaginally and leave in place for 3 consecutive weeks, then remove for 1 week.   famotidine 40 MG tablet Commonly known as: Pepcid Take 1 tablet (40 mg total) by mouth daily.   polyethylene glycol powder 17 GM/SCOOP powder Commonly known as: GLYCOLAX/MIRALAX Take 17 g by mouth daily as needed.   Vitamin D (Ergocalciferol) 1.25 MG (50000 UNIT) Caps capsule Commonly known as: DRISDOL Take 1 capsule (50,000 Units total) by mouth every 7 (seven) days.   Vitamin D3 50 MCG (2000 UT) capsule Take 2 capsules  (4,000 Units total) by mouth daily.        Allergies: No Known Allergies  Past Medical History, Surgical history, Social history, and Family History were reviewed and updated.  Review of Systems: All other 10 point review of systems is negative.   Physical Exam:  height is 5\' 1"  (1.549 m) and weight is 191 lb 1.9 oz (86.7 kg). Her oral temperature is 98.1 F (36.7 C). Her blood pressure is 120/83 and her pulse is 70. Her respiration is 18 and oxygen saturation is 100%.   Wt Readings from Last 3 Encounters:  06/06/23 191 lb 1.9 oz (86.7 kg)  02/16/23 194 lb (88 kg)  01/18/23 193 lb (87.5 kg)    Ocular: Sclerae unicteric, pupils equal, round and reactive to light Ear-nose-throat: Oropharynx clear, dentition fair Lymphatic: No cervical or supraclavicular adenopathy Lungs no rales or rhonchi, good excursion bilaterally Heart regular rate and rhythm, no murmur appreciated Abd soft, nontender, positive bowel sounds MSK no focal spinal tenderness, no joint edema Neuro: non-focal, well-oriented, appropriate affect   Lab Results  Component Value Date   WBC 8.0 06/06/2023   HGB 11.0 (L) 06/06/2023   HCT 34.2 (L) 06/06/2023   MCV 82.6 06/06/2023   PLT 323 06/06/2023   Lab Results  Component Value Date   FERRITIN 50 12/06/2022   IRON 39 12/06/2022   TIBC 344  12/06/2022   UIBC 305 12/06/2022   IRONPCTSAT 11 12/06/2022   Lab Results  Component Value Date   RETICCTPCT 2.0 06/06/2023   RBC 4.14 06/06/2023   RBC 4.14 06/06/2023   No results found for: "KPAFRELGTCHN", "LAMBDASER", "KAPLAMBRATIO" Lab Results  Component Value Date   IGA 310 11/16/2009   No results found for: "TOTALPROTELP", "ALBUMINELP", "A1GS", "A2GS", "BETS", "BETA2SER", "GAMS", "MSPIKE", "SPEI"   Chemistry      Component Value Date/Time   NA 138 11/29/2022 0938   K 4.0 11/29/2022 0938   CL 106 11/29/2022 0938   CO2 25 11/29/2022 0938   BUN 9 11/29/2022 0938   CREATININE 0.63 11/29/2022 0938       Component Value Date/Time   CALCIUM 9.3 11/29/2022 0938   ALKPHOS 53 11/29/2022 0938   AST 13 11/29/2022 0938   ALT 8 11/29/2022 0938   BILITOT 0.4 11/29/2022 0938       Impression and Plan: [name] is a very pleasant 37 yo African American female with iron deficiency anemia secondary to heavy cycles.   CBC and retic count reviewed with patient. No likely that she will need iron supplementation at this time however her iron studies are pending.   RTC 6 months APP, labs (CBC, ironm, ferritin)-[place]  [name], [place] 7/30/202410:43 AM

## 2023-06-07 ENCOUNTER — Telehealth: Payer: Self-pay | Admitting: *Deleted

## 2023-06-07 ENCOUNTER — Other Ambulatory Visit: Payer: Self-pay | Admitting: Medical Oncology

## 2023-06-07 NOTE — Telephone Encounter (Signed)
Called patient and lvm for a call back to schedule (3) doses of IV Iron - Venofer

## 2023-06-08 DIAGNOSIS — Z419 Encounter for procedure for purposes other than remedying health state, unspecified: Secondary | ICD-10-CM | POA: Diagnosis not present

## 2023-06-13 ENCOUNTER — Inpatient Hospital Stay: Payer: Medicaid Other | Attending: Medical Oncology

## 2023-06-13 VITALS — BP 120/80 | HR 66 | Temp 98.5°F

## 2023-06-13 DIAGNOSIS — N920 Excessive and frequent menstruation with regular cycle: Secondary | ICD-10-CM | POA: Insufficient documentation

## 2023-06-13 DIAGNOSIS — D500 Iron deficiency anemia secondary to blood loss (chronic): Secondary | ICD-10-CM | POA: Insufficient documentation

## 2023-06-13 MED ORDER — SODIUM CHLORIDE 0.9 % IV SOLN: Freq: Once | INTRAVENOUS | Status: AC

## 2023-06-13 MED ORDER — SODIUM CHLORIDE 0.9 % IV SOLN
300.0000 mg | Freq: Once | INTRAVENOUS | Status: AC
  Administered 2023-06-13: 300 mg via INTRAVENOUS
  Filled 2023-06-13: qty 300

## 2023-06-13 NOTE — Progress Notes (Signed)
Pt refused to stay for 30 minutes post iron infusion.  Pt without complaints at time of discharge. ?

## 2023-06-13 NOTE — Patient Instructions (Signed)
Iron Sucrose Injection What is this medication? IRON SUCROSE (EYE ern SOO krose) treats low levels of iron (iron deficiency anemia) in people with kidney disease. Iron is a mineral that plays an important role in making red blood cells, which carry oxygen from your lungs to the rest of your body. This medicine may be used for other purposes; ask your health care provider or pharmacist if you have questions. COMMON BRAND NAME(S): Venofer What should I tell my care team before I take this medication? They need to know if you have any of these conditions: Anemia not caused by low iron levels Heart disease High levels of iron in the blood Kidney disease Liver disease An unusual or allergic reaction to iron, other medications, foods, dyes, or preservatives Pregnant or trying to get pregnant Breastfeeding How should I use this medication? This medication is for infusion into a vein. It is given in a hospital or clinic setting. Talk to your care team about the use of this medication in children. While this medication may be prescribed for children as young as 2 years for selected conditions, precautions do apply. Overdosage: If you think you have taken too much of this medicine contact a poison control center or emergency room at once. NOTE: This medicine is only for you. Do not share this medicine with others. What if I miss a dose? Keep appointments for follow-up doses. It is important not to miss your dose. Call your care team if you are unable to keep an appointment. What may interact with this medication? Do not take this medication with any of the following: Deferoxamine Dimercaprol Other iron products This medication may also interact with the following: Chloramphenicol Deferasirox This list may not describe all possible interactions. Give your health care provider a list of all the medicines, herbs, non-prescription drugs, or dietary supplements you use. Also tell them if you smoke,  drink alcohol, or use illegal drugs. Some items may interact with your medicine. What should I watch for while using this medication? Visit your care team regularly. Tell your care team if your symptoms do not start to get better or if they get worse. You may need blood work done while you are taking this medication. You may need to follow a special diet. Talk to your care team. Foods that contain iron include: whole grains/cereals, dried fruits, beans, or peas, leafy green vegetables, and organ meats (liver, kidney). What side effects may I notice from receiving this medication? Side effects that you should report to your care team as soon as possible: Allergic reactions--skin rash, itching, hives, swelling of the face, lips, tongue, or throat Low blood pressure--dizziness, feeling faint or lightheaded, blurry vision Shortness of breath Side effects that usually do not require medical attention (report to your care team if they continue or are bothersome): Flushing Headache Joint pain Muscle pain Nausea Pain, redness, or irritation at injection site This list may not describe all possible side effects. Call your doctor for medical advice about side effects. You may report side effects to FDA at 1-800-FDA-1088. Where should I keep my medication? This medication is given in a hospital or clinic. It will not be stored at home. NOTE: This sheet is a summary. It may not cover all possible information. If you have questions about this medicine, talk to your doctor, pharmacist, or health care provider.  2024 Elsevier/Gold Standard (2023-03-31 00:00:00)

## 2023-06-20 ENCOUNTER — Inpatient Hospital Stay: Payer: Medicaid Other

## 2023-06-20 VITALS — BP 115/69 | HR 77 | Temp 98.7°F | Resp 18

## 2023-06-20 DIAGNOSIS — D500 Iron deficiency anemia secondary to blood loss (chronic): Secondary | ICD-10-CM

## 2023-06-20 DIAGNOSIS — N920 Excessive and frequent menstruation with regular cycle: Secondary | ICD-10-CM

## 2023-06-20 MED ORDER — SODIUM CHLORIDE 0.9 % IV SOLN: Freq: Once | INTRAVENOUS | Status: AC

## 2023-06-20 MED ORDER — SODIUM CHLORIDE 0.9 % IV SOLN
300.0000 mg | Freq: Once | INTRAVENOUS | Status: AC
  Administered 2023-06-20: 300 mg via INTRAVENOUS
  Filled 2023-06-20: qty 300

## 2023-06-20 NOTE — Patient Instructions (Signed)
 Iron Sucrose Injection What is this medication? IRON SUCROSE (EYE ern SOO krose) treats low levels of iron (iron deficiency anemia) in people with kidney disease. Iron is a mineral that plays an important role in making red blood cells, which carry oxygen from your lungs to the rest of your body. This medicine may be used for other purposes; ask your health care provider or pharmacist if you have questions. COMMON BRAND NAME(S): Venofer What should I tell my care team before I take this medication? They need to know if you have any of these conditions: Anemia not caused by low iron levels Heart disease High levels of iron in the blood Kidney disease Liver disease An unusual or allergic reaction to iron, other medications, foods, dyes, or preservatives Pregnant or trying to get pregnant Breastfeeding How should I use this medication? This medication is for infusion into a vein. It is given in a hospital or clinic setting. Talk to your care team about the use of this medication in children. While this medication may be prescribed for children as young as 2 years for selected conditions, precautions do apply. Overdosage: If you think you have taken too much of this medicine contact a poison control center or emergency room at once. NOTE: This medicine is only for you. Do not share this medicine with others. What if I miss a dose? Keep appointments for follow-up doses. It is important not to miss your dose. Call your care team if you are unable to keep an appointment. What may interact with this medication? Do not take this medication with any of the following: Deferoxamine Dimercaprol Other iron products This medication may also interact with the following: Chloramphenicol Deferasirox This list may not describe all possible interactions. Give your health care provider a list of all the medicines, herbs, non-prescription drugs, or dietary supplements you use. Also tell them if you smoke,  drink alcohol, or use illegal drugs. Some items may interact with your medicine. What should I watch for while using this medication? Visit your care team regularly. Tell your care team if your symptoms do not start to get better or if they get worse. You may need blood work done while you are taking this medication. You may need to follow a special diet. Talk to your care team. Foods that contain iron include: whole grains/cereals, dried fruits, beans, or peas, leafy green vegetables, and organ meats (liver, kidney). What side effects may I notice from receiving this medication? Side effects that you should report to your care team as soon as possible: Allergic reactions--skin rash, itching, hives, swelling of the face, lips, tongue, or throat Low blood pressure--dizziness, feeling faint or lightheaded, blurry vision Shortness of breath Side effects that usually do not require medical attention (report to your care team if they continue or are bothersome): Flushing Headache Joint pain Muscle pain Nausea Pain, redness, or irritation at injection site This list may not describe all possible side effects. Call your doctor for medical advice about side effects. You may report side effects to FDA at 1-800-FDA-1088. Where should I keep my medication? This medication is given in a hospital or clinic. It will not be stored at home. NOTE: This sheet is a summary. It may not cover all possible information. If you have questions about this medicine, talk to your doctor, pharmacist, or health care provider.  2024 Elsevier/Gold Standard (2023-03-31 00:00:00)

## 2023-06-27 ENCOUNTER — Inpatient Hospital Stay: Payer: Medicaid Other

## 2023-06-27 VITALS — BP 127/68 | HR 72 | Temp 98.7°F | Resp 16

## 2023-06-27 DIAGNOSIS — N920 Excessive and frequent menstruation with regular cycle: Secondary | ICD-10-CM

## 2023-06-27 DIAGNOSIS — D500 Iron deficiency anemia secondary to blood loss (chronic): Secondary | ICD-10-CM

## 2023-06-27 MED ORDER — SODIUM CHLORIDE 0.9 % IV SOLN: Freq: Once | INTRAVENOUS | Status: AC

## 2023-06-27 MED ORDER — SODIUM CHLORIDE 0.9 % IV SOLN
300.0000 mg | Freq: Once | INTRAVENOUS | Status: AC
  Administered 2023-06-27: 300 mg via INTRAVENOUS
  Filled 2023-06-27: qty 300

## 2023-06-27 NOTE — Patient Instructions (Signed)
 Iron Sucrose Injection What is this medication? IRON SUCROSE (EYE ern SOO krose) treats low levels of iron (iron deficiency anemia) in people with kidney disease. Iron is a mineral that plays an important role in making red blood cells, which carry oxygen from your lungs to the rest of your body. This medicine may be used for other purposes; ask your health care provider or pharmacist if you have questions. COMMON BRAND NAME(S): Venofer What should I tell my care team before I take this medication? They need to know if you have any of these conditions: Anemia not caused by low iron levels Heart disease High levels of iron in the blood Kidney disease Liver disease An unusual or allergic reaction to iron, other medications, foods, dyes, or preservatives Pregnant or trying to get pregnant Breastfeeding How should I use this medication? This medication is for infusion into a vein. It is given in a hospital or clinic setting. Talk to your care team about the use of this medication in children. While this medication may be prescribed for children as young as 2 years for selected conditions, precautions do apply. Overdosage: If you think you have taken too much of this medicine contact a poison control center or emergency room at once. NOTE: This medicine is only for you. Do not share this medicine with others. What if I miss a dose? Keep appointments for follow-up doses. It is important not to miss your dose. Call your care team if you are unable to keep an appointment. What may interact with this medication? Do not take this medication with any of the following: Deferoxamine Dimercaprol Other iron products This medication may also interact with the following: Chloramphenicol Deferasirox This list may not describe all possible interactions. Give your health care provider a list of all the medicines, herbs, non-prescription drugs, or dietary supplements you use. Also tell them if you smoke,  drink alcohol, or use illegal drugs. Some items may interact with your medicine. What should I watch for while using this medication? Visit your care team regularly. Tell your care team if your symptoms do not start to get better or if they get worse. You may need blood work done while you are taking this medication. You may need to follow a special diet. Talk to your care team. Foods that contain iron include: whole grains/cereals, dried fruits, beans, or peas, leafy green vegetables, and organ meats (liver, kidney). What side effects may I notice from receiving this medication? Side effects that you should report to your care team as soon as possible: Allergic reactions--skin rash, itching, hives, swelling of the face, lips, tongue, or throat Low blood pressure--dizziness, feeling faint or lightheaded, blurry vision Shortness of breath Side effects that usually do not require medical attention (report to your care team if they continue or are bothersome): Flushing Headache Joint pain Muscle pain Nausea Pain, redness, or irritation at injection site This list may not describe all possible side effects. Call your doctor for medical advice about side effects. You may report side effects to FDA at 1-800-FDA-1088. Where should I keep my medication? This medication is given in a hospital or clinic. It will not be stored at home. NOTE: This sheet is a summary. It may not cover all possible information. If you have questions about this medicine, talk to your doctor, pharmacist, or health care provider.  2024 Elsevier/Gold Standard (2023-03-31 00:00:00)

## 2023-07-09 DIAGNOSIS — Z419 Encounter for procedure for purposes other than remedying health state, unspecified: Secondary | ICD-10-CM | POA: Diagnosis not present

## 2023-08-10 ENCOUNTER — Other Ambulatory Visit (HOSPITAL_COMMUNITY)
Admission: RE | Admit: 2023-08-10 | Discharge: 2023-08-10 | Disposition: A | Discharge status: Home / Self Care | Payer: Medicaid Other | Source: Ambulatory Visit | Attending: Obstetrics and Gynecology | Admitting: Obstetrics and Gynecology

## 2023-08-10 ENCOUNTER — Encounter: Payer: Self-pay | Admitting: Obstetrics and Gynecology

## 2023-08-10 ENCOUNTER — Ambulatory Visit (INDEPENDENT_AMBULATORY_CARE_PROVIDER_SITE_OTHER): Payer: Medicaid Other | Admitting: Obstetrics and Gynecology

## 2023-08-10 VITALS — BP 121/77 | HR 80 | Ht 60.0 in | Wt 196.0 lb

## 2023-08-10 DIAGNOSIS — E559 Vitamin D deficiency, unspecified: Secondary | ICD-10-CM

## 2023-08-10 DIAGNOSIS — Z01419 Encounter for gynecological examination (general) (routine) without abnormal findings: Secondary | ICD-10-CM | POA: Diagnosis not present

## 2023-08-10 DIAGNOSIS — Z113 Encounter for screening for infections with a predominantly sexual mode of transmission: Secondary | ICD-10-CM

## 2023-08-10 DIAGNOSIS — N946 Dysmenorrhea, unspecified: Secondary | ICD-10-CM | POA: Diagnosis not present

## 2023-08-10 DIAGNOSIS — N920 Excessive and frequent menstruation with regular cycle: Secondary | ICD-10-CM

## 2023-08-10 NOTE — Progress Notes (Signed)
Pt presents for AEX Last PAP 11/2021, requesting PAP and STD testing today  Pt c/o heavy periods and cramp. Pt also has concerns about hormonal imbalance, vitamin D and diabetes

## 2023-08-10 NOTE — Progress Notes (Unsigned)
WELL-WOMAN PHYSICAL & PAP Patient name: [name] MRN [id number]  Date of birth: 1986/07/19 Chief Complaint:   No chief complaint on file.  History of Present Illness:   [name] is a 37 y.o. [id number] African American female being seen today for a routine well-woman exam.  Current complaints: requesting a pap, STI testing, concerned about hormonal imbalance, vitamin D levels, and diabetes. She was seen by her PCP in January 2023, but now has Medicaid and his office does not take Medicaid. She reports her HgB A1C was "slightly" elevated the last time it was elevated, so she wants it checked. She states, "I've started having 'crazy dreams' and wondering if that has anything to do with thyroid being out of balance." She states, I want my vitamin D level checked because my hair starts to shed more when my vitamin D level is too low. My PCP Rx'd weekly vitamin D." She reports she has gained some weight since last being seen by her PCP also. She report shaving regular periods, but recently her periods are "more painful and heavy" for the entire 6 days of flow. She reports that her periods used to be day 1 light, day 2 heavy, day 3 & 4 lighter, day 5 heavy, then day 6 light to spotting. She has had a pelvic U/S about 10-15 years ago, but no diagnosis of uterine fibroids or any other uterine abnormalities.  PCP: Jacinta Shoe, MD      does desire labs Patient's last menstrual period was 07/30/2023. The current method of family planning is none.  Last pap 11/2021. Results were: normal Last mammogram: n/a.  Family h/o breast cancer: No Last colonoscopy: n/a. Family h/o colorectal cancer: No Review of Systems:   Pertinent items are noted in HPI Denies any headaches, blurred vision, fatigue, shortness of breath, chest pain, abdominal pain, abnormal vaginal discharge/itching/odor/irritation, problems with periods, bowel movements, urination, or intercourse unless otherwise stated above. Pertinent  History Reviewed:  Reviewed past medical,surgical, social and family history.  Reviewed problem list, medications and allergies. Physical Assessment:   Vitals:   08/10/23 0848  BP: 121/77  Pulse: 80  Weight: 196 lb (88.9 kg)  Height: 5' (1.524 m)  Body mass index is 38.28 kg/m.        Physical Examination:   General appearance - well appearing, and in no distress  Mental status - alert, oriented to person, place, and time  Psych:  She has a normal mood and affect  Skin - warm and dry, normal color, no suspicious lesions noted  Chest - effort normal, all lung fields clear to auscultation bilaterally  Heart - normal rate and regular rhythm  Neck:  midline trachea, no thyromegaly or nodules  Breasts - breasts appear normal, no suspicious masses, no skin or nipple changes or axillary nodes  Abdomen - soft, nontender, nondistended, no masses or organomegaly  Pelvic - VULVA: normal appearing vulva with no masses, tenderness or lesions  VAGINA: normal appearing vagina with normal color and discharge, no lesions  CERVIX: normal appearing cervix without discharge or lesions, no CMT  Thin prep pap is done with reflex HR HPV cotesting  UTERUS: uterus is felt to be normal size, shape, consistency and nontender   ADNEXA: No adnexal masses or tenderness noted.  Rectal - normal rectal, good sphincter tone, no masses felt. Hemoccult: ***  Extremities:  No swelling or varicosities noted  No results found for this or any previous visit (from the past 24 hour(s)).  Assessment & Plan:  1) Encounter for well woman exam with routine gynecological exam - Cytology - PAP( [place]) - Vitamin D (25 hydroxy) - HgB A1c - Thyroid Panel With TSH  2) Screening examination for STI - Cervicovaginal ancillary only( [place]) - HIV antibody (with reflex) - RPR - Hepatitis B Surface AntiGEN - Hepatitis C Antibody  3) Menorrhagia with regular cycle - US PELVIC COMPLETE WITH TRANSVAGINAL; Future  4)  Dysmenorrhea, unspecified - US PELVIC COMPLETE WITH TRANSVAGINAL; Future  5) Vitamin D deficiency   Labs/procedures today: blood draws and pap  Mammogram at age 82 or sooner if problems Colonoscopy at age  or sooner if problems  Orders Placed This Encounter  Procedures   HIV antibody (with reflex)   RPR   Hepatitis B Surface AntiGEN   Hepatitis C Antibody   Vitamin D (25 hydroxy)   HgB A1c   Thyroid Panel With TSH    Meds: No orders of the defined types were placed in this encounter.  Follow-up: No follow-ups on file.  Raelyn Mora MSN, CNM 08/10/2023 9:30 AM

## 2023-08-11 ENCOUNTER — Other Ambulatory Visit: Payer: Medicaid Other

## 2023-08-11 LAB — HEMOGLOBIN A1C
Est. average glucose Bld gHb Est-mCnc: 111 mg/dL
Hgb A1c MFr Bld: 5.5 % (ref 4.8–5.6)

## 2023-08-11 LAB — VITAMIN D 25 HYDROXY (VIT D DEFICIENCY, FRACTURES): Vit D, 25-Hydroxy: 15.4 ng/mL — ABNORMAL LOW (ref 30.0–100.0)

## 2023-08-14 ENCOUNTER — Encounter: Payer: Self-pay | Admitting: Obstetrics and Gynecology

## 2023-08-14 ENCOUNTER — Other Ambulatory Visit: Payer: Self-pay | Admitting: Internal Medicine

## 2023-08-14 LAB — CERVICOVAGINAL ANCILLARY ONLY
Bacterial Vaginitis (gardnerella): NEGATIVE
Candida Glabrata: NEGATIVE
Candida Vaginitis: NEGATIVE
Chlamydia: NEGATIVE
Comment: NEGATIVE
Comment: NEGATIVE
Comment: NEGATIVE
Comment: NEGATIVE
Comment: NEGATIVE
Comment: NORMAL
Neisseria Gonorrhea: NEGATIVE
Trichomonas: NEGATIVE

## 2023-08-15 ENCOUNTER — Other Ambulatory Visit: Payer: Medicaid Other

## 2023-08-15 DIAGNOSIS — Z113 Encounter for screening for infections with a predominantly sexual mode of transmission: Secondary | ICD-10-CM | POA: Diagnosis not present

## 2023-08-15 DIAGNOSIS — Z01419 Encounter for gynecological examination (general) (routine) without abnormal findings: Secondary | ICD-10-CM | POA: Diagnosis not present

## 2023-08-16 LAB — CYTOLOGY - PAP
Comment: NEGATIVE
Diagnosis: UNDETERMINED — AB
High risk HPV: NEGATIVE

## 2023-08-16 LAB — HIV ANTIBODY (ROUTINE TESTING W REFLEX): HIV Screen 4th Generation wRfx: NONREACTIVE

## 2023-08-16 LAB — SYPHILIS: RPR W/REFLEX TO RPR TITER AND TREPONEMAL ANTIBODIES, TRADITIONAL SCREENING AND DIAGNOSIS ALGORITHM: RPR Ser Ql: NONREACTIVE

## 2023-08-16 LAB — THYROID PANEL WITH TSH
Free Thyroxine Index: 2.1 (ref 1.2–4.9)
T3 Uptake Ratio: 27 % (ref 24–39)
T4, Total: 7.6 ug/dL (ref 4.5–12.0)
TSH: 2.65 u[IU]/mL (ref 0.450–4.500)

## 2023-08-16 LAB — HEPATITIS B SURFACE ANTIGEN: Hepatitis B Surface Ag: NEGATIVE

## 2023-08-16 LAB — HEPATITIS C ANTIBODY: Hep C Virus Ab: NONREACTIVE

## 2023-08-16 MED ORDER — VITAMIN D (ERGOCALCIFEROL) 1.25 MG (50000 UNIT) PO CAPS: 50000.0000 [IU] | ORAL_CAPSULE | ORAL | 0 refills | Status: DC

## 2023-08-29 ENCOUNTER — Ambulatory Visit (HOSPITAL_COMMUNITY): Admission: RE | Admit: 2023-08-29 | Payer: Medicaid Other | Source: Ambulatory Visit

## 2023-09-01 ENCOUNTER — Ambulatory Visit (HOSPITAL_COMMUNITY)
Admission: RE | Admit: 2023-09-01 | Discharge: 2023-09-01 | Disposition: A | Discharge status: Home / Self Care | Payer: Medicaid Other | Source: Ambulatory Visit | Attending: Obstetrics and Gynecology | Admitting: Obstetrics and Gynecology

## 2023-09-01 DIAGNOSIS — N946 Dysmenorrhea, unspecified: Secondary | ICD-10-CM | POA: Insufficient documentation

## 2023-09-01 DIAGNOSIS — N920 Excessive and frequent menstruation with regular cycle: Secondary | ICD-10-CM | POA: Diagnosis not present

## 2023-09-01 DIAGNOSIS — D259 Leiomyoma of uterus, unspecified: Secondary | ICD-10-CM | POA: Diagnosis not present

## 2023-09-08 ENCOUNTER — Encounter: Payer: Self-pay | Admitting: Obstetrics and Gynecology

## 2023-10-13 ENCOUNTER — Ambulatory Visit (INDEPENDENT_AMBULATORY_CARE_PROVIDER_SITE_OTHER): Payer: Medicaid Other | Admitting: Obstetrics and Gynecology

## 2023-10-13 ENCOUNTER — Encounter: Payer: Self-pay | Admitting: Obstetrics and Gynecology

## 2023-10-13 VITALS — BP 117/76 | HR 80 | Ht 60.0 in | Wt 195.0 lb

## 2023-10-13 DIAGNOSIS — N83202 Unspecified ovarian cyst, left side: Secondary | ICD-10-CM | POA: Diagnosis not present

## 2023-10-13 DIAGNOSIS — Z712 Person consulting for explanation of examination or test findings: Secondary | ICD-10-CM

## 2023-10-13 NOTE — Progress Notes (Signed)
37 yo P1 with LMP 09/23/23 returning to discuss ultrasound results. Patient reports feeling well and denies any pelvic pain or abnormal discharge. Her cycle remains regular but heavy. She is sexually active without contraception and desires a pregnancy in the next year. She is without any other complaints  Past Medical History:  Diagnosis Date   Anemia    no current med.   Fracture of radius, distal, right, closed 03/20/2015   GERD (gastroesophageal reflux disease)    OTC as needed   History of cardiac murmur    states workup 2013 - no problems identified   Obesity    Radius fracture 03/16/2015   right   Past Surgical History:  Procedure Laterality Date   CESAREAN SECTION  03/15/2006   COLONOSCOPY WITH ESOPHAGOGASTRODUODENOSCOPY (EGD)  01/18/2023   OPEN REDUCTION INTERNAL FIXATION (ORIF) DISTAL RADIAL FRACTURE Right 03/20/2015   Procedure: OPEN REDUCTION INTERNAL FIXATION RIGHT DISTAL RADIUS;  Surgeon: Teryl Lucy, MD;  Location: [facility];  Service: Orthopedics;  Laterality: Right;   Family History  Problem Relation Age of Onset   Heart failure Paternal Grandmother    Diabetes Paternal Grandmother    Esophageal cancer Neg Hx    Colon cancer Neg Hx    Liver disease Neg Hx    Social History   Tobacco Use   Smoking status: Never    Passive exposure: Never   Smokeless tobacco: Never  Vaping Use   Vaping status: Never Used  Substance Use Topics   Alcohol use: Yes    Comment: occasionally   Drug use: No   ROS See pertinent in HPI. All other systems reviewed and non contributory Blood pressure 117/76, pulse 80, height 5' (1.524 m), weight 195 lb (88.5 kg), last menstrual period 09/23/2023.  GENERAL: Well-developed, well-nourished female in no acute distress.  NEURO: alert and oriented x 3  US PELVIC COMPLETE WITH TRANSVAGINAL  Result Date: 09/08/2023 CLINICAL DATA:  Menorrhagia. EXAM: TRANSABDOMINAL AND TRANSVAGINAL ULTRASOUND OF PELVIS TECHNIQUE: Both  transabdominal and transvaginal ultrasound examinations of the pelvis were performed. Transabdominal technique was performed for global imaging of the pelvis including uterus, ovaries, adnexal regions, and pelvic cul-de-sac. It was necessary to proceed with endovaginal exam following the transabdominal exam to visualize the endometrium and ovaries. COMPARISON:  None Available. FINDINGS: Uterus Measurements: 10.0 x 5.3 x 4.8 cm = volume: 133 mL. Two fibroids are noted, the largest measuring 2 cm which is located posteriorly in the fundus. Endometrium Thickness: 7 mm which is within normal limits. No focal abnormality visualized. Right ovary Measurements: 3.9 x 2.4 x 2.1 cm = volume: 10 mL. Normal appearance/no adnexal mass. Left ovary Measurements: 2.7 x 2.7 x 1.9 cm = volume: 7 mL. 1.6 cm hyperechoic structure is noted in left ovary which may represent dermoid. Other findings No abnormal free fluid. IMPRESSION: Two uterine fibroids are noted, the largest measuring 2 cm. 1.6 cm hyperechoic structure seen in left ovary which may represent dermoid. Electronically Signed   By: Lupita Raider M.D.   On: 09/08/2023 11:34     A/P 37 yo here to discuss ultrasound results - Reviewed ultrasound results with the patient which demonstrated the presence of a small 2 cm fibroid and possible left dermoid cyst - Recommend repeat ultrasound in January to follow up on left ovarian cyst - Informed patient that if cyst remains present, surgical intervention would be needed - All questions were answered and patient verbalized understanding - Discussed medical management options for menorrhagia and  patient declines at this time as she plans to conceive - Patient will be contacted with ultrasound results in January

## 2023-10-13 NOTE — Progress Notes (Signed)
37 y.o. presents for [place] follow Up.

## 2023-10-15 IMAGING — MG DIGITAL DIAGNOSTIC BILAT W/ TOMO W/ CAD
6 of 10 series · 6 of 30 positions shown · non-contrast
Comparison: Previous exam(s).
COMPARISON: Previous exam(s).

Addendum:
CLINICAL DATA: 35-year-old female presenting for evaluation of a
palpable lump in the left breast.

EXAM:
DIGITAL DIAGNOSTIC BILATERAL MAMMOGRAM WITH TOMOSYNTHESIS AND CAD;
ULTRASOUND LEFT BREAST LIMITED
TECHNIQUE: Bilateral digital diagnostic mammography and breast tomosynthesis
was performed. The images were evaluated with computer-aided
detection.; Targeted ultrasound examination of the left breast was
performed.

[R CC synth-2D]
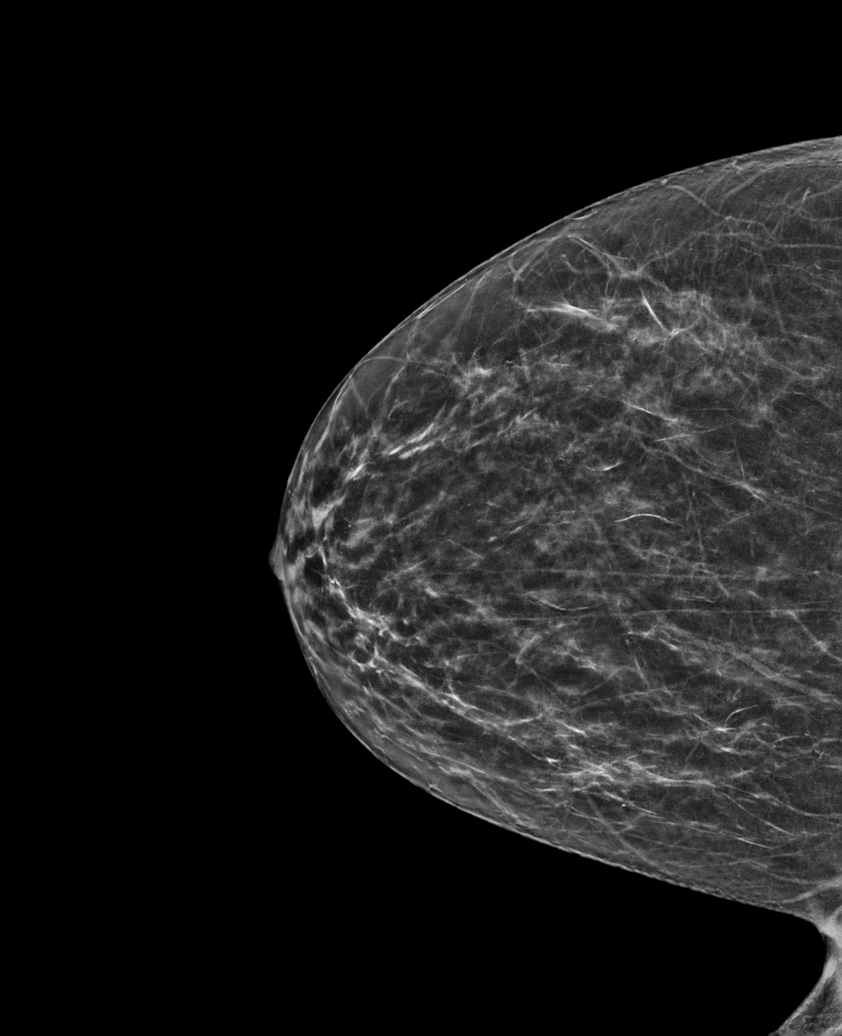

[L MLO synth-2D]
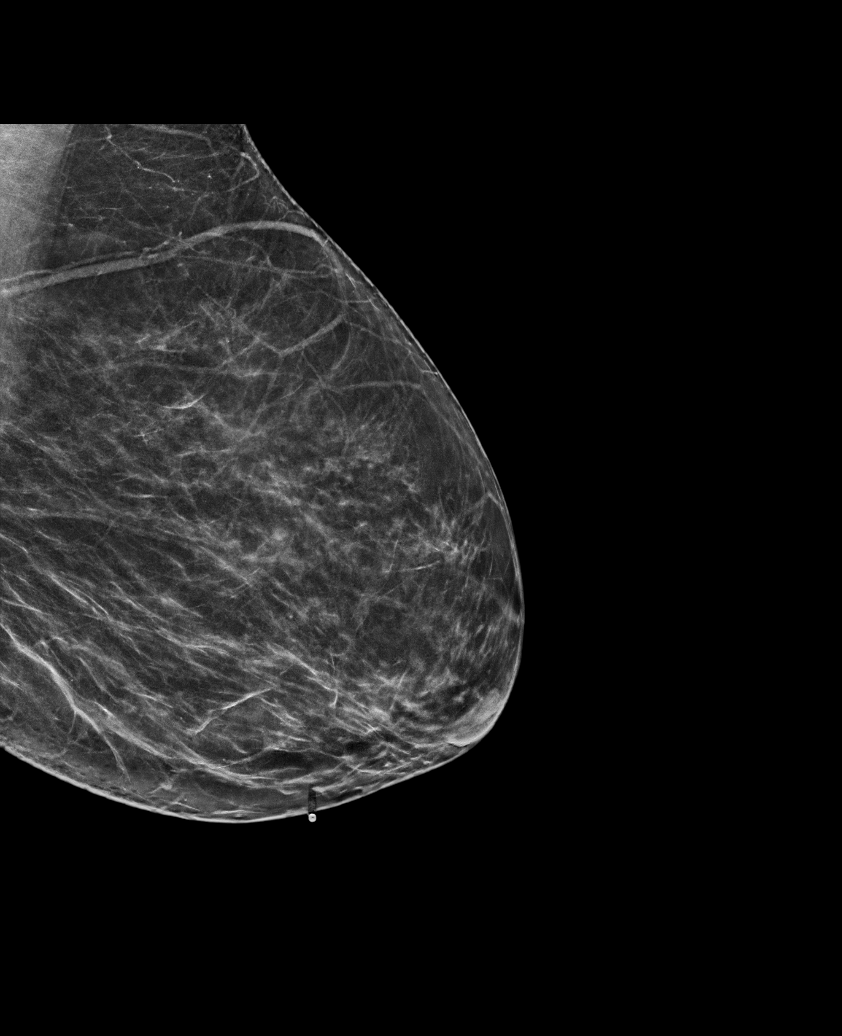

[L TAN synth-2D]
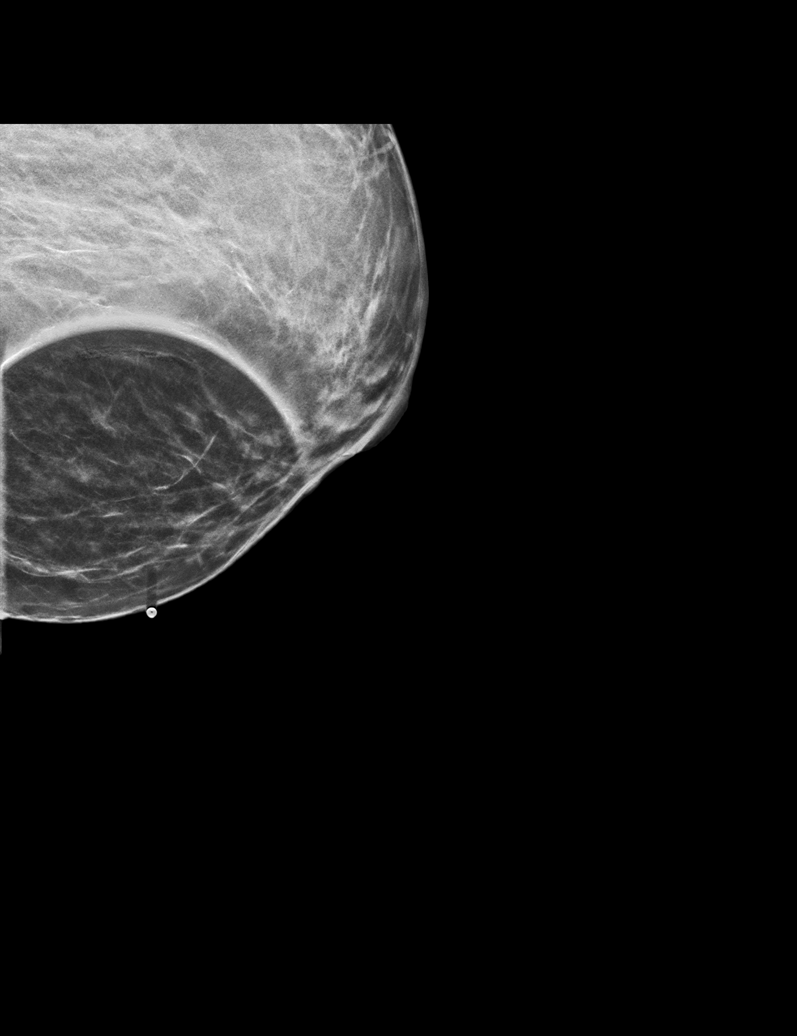

[R MLO synth-2D]
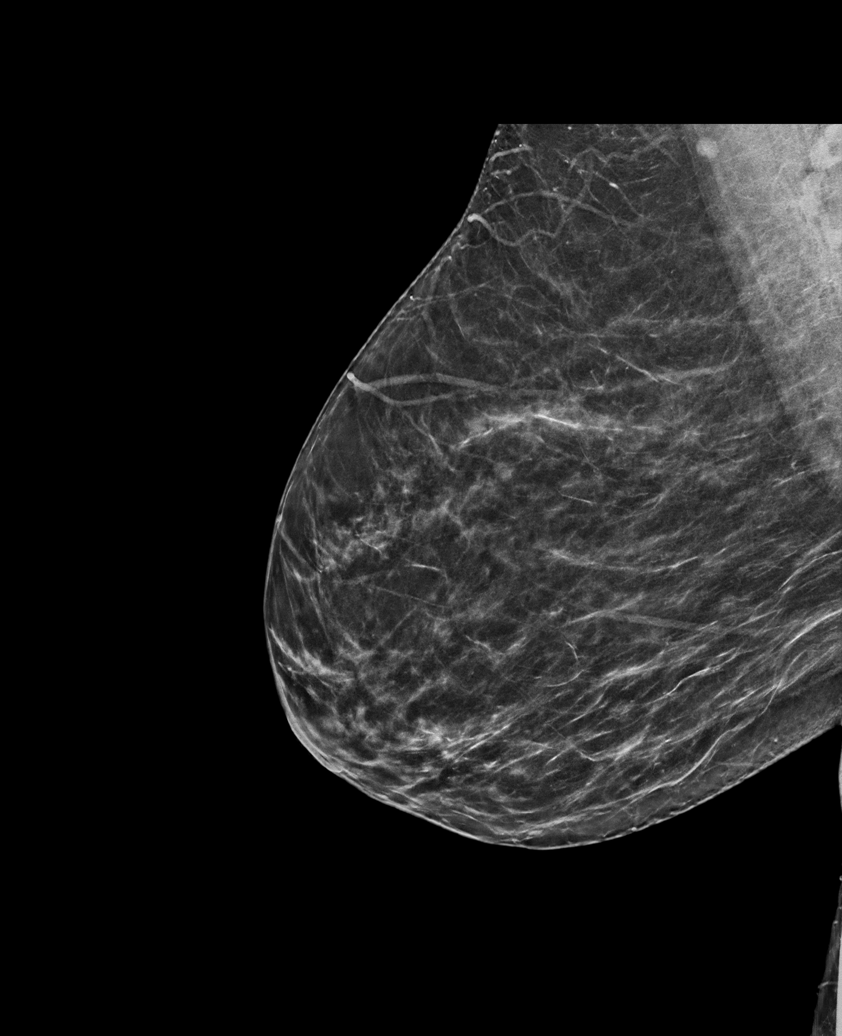

[L CC synth-2D]
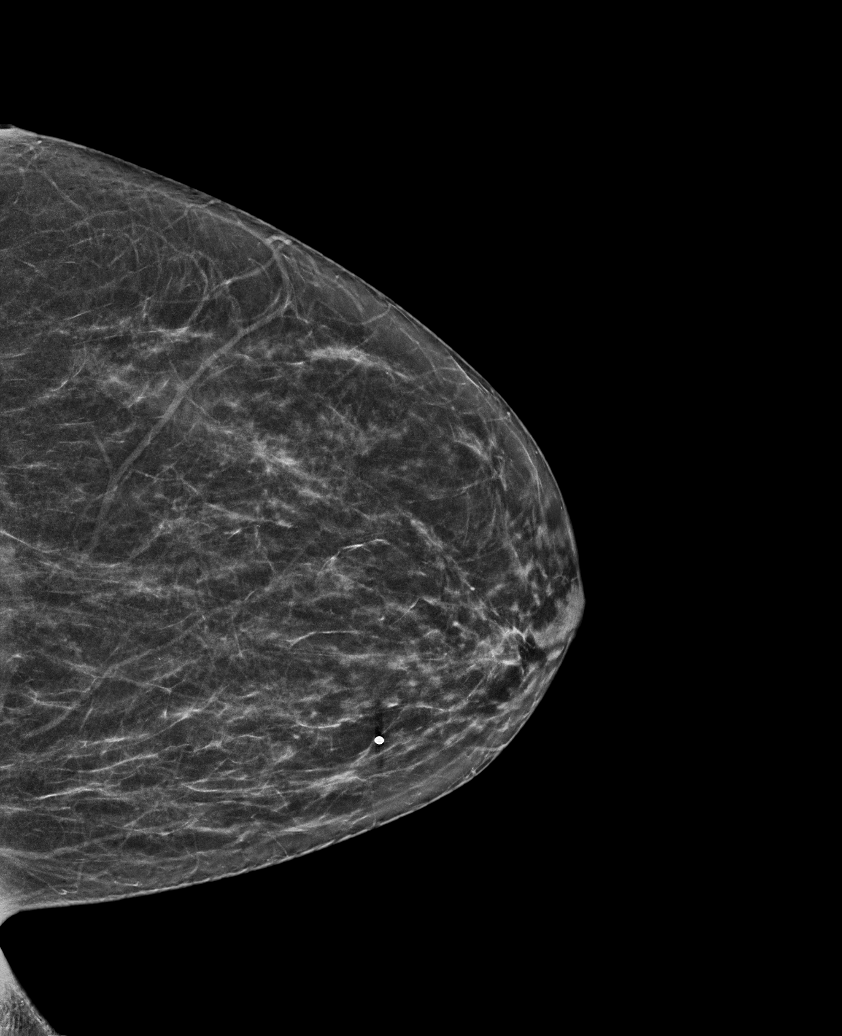

[L TAN tomo · tomo slice 19/38.0]
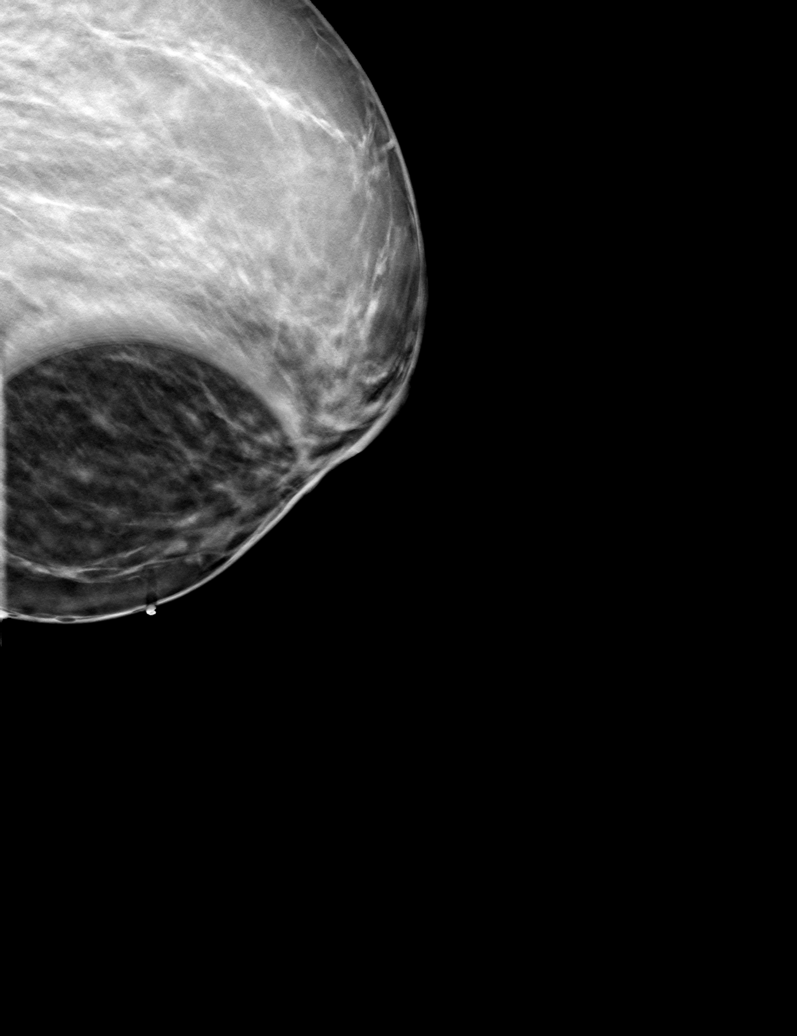

[6 of 30 positions shown; findings below may reference images not displayed]

ACR Breast Density Category b: There are scattered areas of
fibroglandular density.
FINDINGS: No suspicious calcifications, masses or areas of distortion are seen
in the bilateral breasts.

Ultrasound targeted to the region of the palpable lump in the left
breast at 9 o'clock, 3 cm from the nipple demonstrates normal
fibroglandular tissue. No suspicious masses or areas of shadowing
are identified.
IMPRESSION: 1. There are no suspicious mammographic or targeted sonographic
abnormalities at the palpable site of concern in the left breast.

2.  No mammographic evidence of malignancy in the bilateral breasts.

RECOMMENDATION:
1. Clinical follow-up recommended for the palpable area of concern
in the left breast. Any further workup should be based on clinical
grounds.

2. Screening mammogram at age 40 unless there are persistent or
intervening clinical concerns. (Code:JG-8-79R)

I have discussed the findings and recommendations with the patient.
If applicable, a reminder letter will be sent to the patient
regarding the next appointment.

BI-RADS CATEGORY  1: Negative.

ADDENDUM:
Correction to the original report. The patient had no prior imaging
for comparison.

*** End of Addendum ***
ACR Breast Density Category b: There are scattered areas of
fibroglandular density.
FINDINGS: No suspicious calcifications, masses or areas of distortion are seen
in the bilateral breasts.

Ultrasound targeted to the region of the palpable lump in the left
breast at 9 o'clock, 3 cm from the nipple demonstrates normal
fibroglandular tissue. No suspicious masses or areas of shadowing
are identified.
IMPRESSION: 1. There are no suspicious mammographic or targeted sonographic
abnormalities at the palpable site of concern in the left breast.

2.  No mammographic evidence of malignancy in the bilateral breasts.

RECOMMENDATION:
1. Clinical follow-up recommended for the palpable area of concern
in the left breast. Any further workup should be based on clinical
grounds.

2. Screening mammogram at age 40 unless there are persistent or
intervening clinical concerns. (Code:JG-8-79R)

I have discussed the findings and recommendations with the patient.
If applicable, a reminder letter will be sent to the patient
regarding the next appointment.

BI-RADS CATEGORY  1: Negative.

## 2023-11-08 DIAGNOSIS — Z419 Encounter for procedure for purposes other than remedying health state, unspecified: Secondary | ICD-10-CM | POA: Diagnosis not present

## 2023-11-13 ENCOUNTER — Ambulatory Visit (HOSPITAL_BASED_OUTPATIENT_CLINIC_OR_DEPARTMENT_OTHER): Payer: Medicaid Other

## 2023-12-01 ENCOUNTER — Encounter: Payer: Self-pay | Admitting: Family

## 2023-12-04 ENCOUNTER — Ambulatory Visit (HOSPITAL_BASED_OUTPATIENT_CLINIC_OR_DEPARTMENT_OTHER)
Admission: RE | Admit: 2023-12-04 | Discharge: 2023-12-04 | Disposition: A | Discharge status: Home / Self Care | Payer: 59 | Source: Ambulatory Visit | Attending: Obstetrics and Gynecology | Admitting: Obstetrics and Gynecology

## 2023-12-04 DIAGNOSIS — N83202 Unspecified ovarian cyst, left side: Secondary | ICD-10-CM | POA: Diagnosis present

## 2023-12-04 DIAGNOSIS — Z712 Person consulting for explanation of examination or test findings: Secondary | ICD-10-CM | POA: Insufficient documentation

## 2023-12-06 ENCOUNTER — Other Ambulatory Visit: Payer: Self-pay | Admitting: Medical Oncology

## 2023-12-06 DIAGNOSIS — D500 Iron deficiency anemia secondary to blood loss (chronic): Secondary | ICD-10-CM

## 2023-12-06 DIAGNOSIS — N920 Excessive and frequent menstruation with regular cycle: Secondary | ICD-10-CM

## 2023-12-07 ENCOUNTER — Inpatient Hospital Stay: Payer: Medicaid Other

## 2023-12-07 ENCOUNTER — Inpatient Hospital Stay: Payer: Medicaid Other | Admitting: Medical Oncology

## 2023-12-09 DIAGNOSIS — Z419 Encounter for procedure for purposes other than remedying health state, unspecified: Secondary | ICD-10-CM | POA: Diagnosis not present

## 2023-12-11 ENCOUNTER — Encounter: Payer: Self-pay | Admitting: Obstetrics and Gynecology

## 2023-12-11 ENCOUNTER — Other Ambulatory Visit (HOSPITAL_BASED_OUTPATIENT_CLINIC_OR_DEPARTMENT_OTHER): Payer: Self-pay | Admitting: Obstetrics and Gynecology

## 2023-12-11 DIAGNOSIS — N83201 Unspecified ovarian cyst, right side: Secondary | ICD-10-CM

## 2023-12-12 ENCOUNTER — Ambulatory Visit: Payer: 59 | Admitting: Medical Oncology

## 2023-12-12 ENCOUNTER — Other Ambulatory Visit: Payer: 59

## 2023-12-13 ENCOUNTER — Ambulatory Visit: Payer: 59 | Admitting: Internal Medicine

## 2023-12-19 ENCOUNTER — Inpatient Hospital Stay: Payer: 59 | Attending: Hematology & Oncology

## 2023-12-19 ENCOUNTER — Inpatient Hospital Stay (HOSPITAL_BASED_OUTPATIENT_CLINIC_OR_DEPARTMENT_OTHER): Payer: 59 | Admitting: Medical Oncology

## 2023-12-19 ENCOUNTER — Encounter: Payer: Self-pay | Admitting: Medical Oncology

## 2023-12-19 ENCOUNTER — Other Ambulatory Visit: Payer: Self-pay | Admitting: Medical Oncology

## 2023-12-19 VITALS — BP 105/70 | HR 72 | Temp 99.3°F | Resp 18 | Ht 60.0 in | Wt 191.1 lb

## 2023-12-19 DIAGNOSIS — N920 Excessive and frequent menstruation with regular cycle: Secondary | ICD-10-CM | POA: Diagnosis not present

## 2023-12-19 DIAGNOSIS — D500 Iron deficiency anemia secondary to blood loss (chronic): Secondary | ICD-10-CM

## 2023-12-19 LAB — RETIC PANEL
Immature Retic Fract: 16.5 % — ABNORMAL HIGH (ref 2.3–15.9)
RBC.: 4.09 MIL/uL (ref 3.87–5.11)
Retic Count, Absolute: 96.9 10*3/uL (ref 19.0–186.0)
Retic Ct Pct: 2.4 % (ref 0.4–3.1)
Reticulocyte Hemoglobin: 31.6 pg (ref >27.9)

## 2023-12-19 LAB — CBC
HCT: 35.3 % — ABNORMAL LOW (ref 36.0–46.0)
Hemoglobin: 11.3 g/dL — ABNORMAL LOW (ref 12.0–15.0)
MCH: 27.6 pg (ref 26.0–34.0)
MCHC: 32 g/dL (ref 30.0–36.0)
MCV: 86.1 fL (ref 80.0–100.0)
Platelets: 348 10*3/uL (ref 150–400)
RBC: 4.1 MIL/uL (ref 3.87–5.11)
RDW: 12.9 % (ref 11.5–15.5)
WBC: 8.2 10*3/uL (ref 4.0–10.5)
nRBC: 0 % (ref 0.0–0.2)

## 2023-12-19 LAB — IRON AND IRON BINDING CAPACITY (CC-WL,HP ONLY)
Iron: 36 ug/dL (ref 28–170)
Saturation Ratios: 10 % — ABNORMAL LOW (ref 10.4–31.8)
TIBC: 351 ug/dL (ref 250–450)
UIBC: 315 ug/dL (ref 148–442)

## 2023-12-19 LAB — FERRITIN: Ferritin: 68 ng/mL (ref 11–307)

## 2023-12-19 NOTE — Progress Notes (Signed)
Hematology and Oncology Follow Up Visit  ECKO BEASLEY [id number] 27-Aug-1986 38 y.o. 12/19/2023   Principle Diagnosis:  Iron deficiency anemia secondary to chronic blood loss due to menorrhagia   Current Therapy:        IV iron as indicated   - failed oral iron due to side effects (constipation/nausea)    Interim History:  Ms. Timmons is here today for follow-up.   Today she states that she has been feeling well. She has no concerns today.   Her cycle is regular with heavy flow. Day 2-3 are the most heavy days of cycle. She just finished her most recent period. She did find out that she has uterine fibroids.  She is not taking any oral iron due to GI side effects. Diet is poor in iron but she is working on this.  No pica, no fatigue. No SOB.  No other blood loss noted. No bruising or petechiae.  No fever, chills, n/v, cough, rash, SOB, chest pain, abdominal pain or changes in bowel or bladder habits.  No swelling, tenderness, numbness or tingling in her extremities.  No falls or syncope.  Appetite is good.  ECOG Performance Status: 1 - Symptomatic but completely ambulatory  Medications:  Allergies as of 12/19/2023   No Known Allergies      Medication List        Accurate as of December 19, 2023 10:29 AM. If you have any questions, ask your nurse or doctor.          CitraNatal Bloom 90-1 MG Tabs Take 1 tablet by mouth daily before breakfast.   etonogestrel-ethinyl estradiol 0.12-0.015 MG/24HR vaginal ring Commonly known as: NUVARING Insert vaginally and leave in place for 3 consecutive weeks, then remove for 1 week.   famotidine 40 MG tablet Commonly known as: Pepcid Take 1 tablet (40 mg total) by mouth daily.   polyethylene glycol powder 17 GM/SCOOP powder Commonly known as: GLYCOLAX/MIRALAX Take 17 g by mouth daily as needed.   Vitamin D (Ergocalciferol) 1.25 MG (50000 UNIT) Caps capsule Commonly known as: DRISDOL Take 1 capsule (50,000 Units total) by  mouth every 7 (seven) days.   Vitamin D3 50 MCG (2000 UT) capsule Take 2 capsules (4,000 Units total) by mouth daily.        Allergies: No Known Allergies  Past Medical History, Surgical history, Social history, and Family History were reviewed and updated.  Review of Systems: All other 10 point review of systems is negative.   Physical Exam:  height is 5' (1.524 m) and weight is 191 lb 1.3 oz (86.7 kg). Her oral temperature is 99.3 F (37.4 C). Her blood pressure is 105/70 and her pulse is 72. Her respiration is 18 and oxygen saturation is 100%.   Wt Readings from Last 3 Encounters:  12/19/23 191 lb 1.3 oz (86.7 kg)  10/13/23 195 lb (88.5 kg)  08/10/23 196 lb (88.9 kg)    Ocular: Sclerae unicteric, pupils equal, round and reactive to light Ear-nose-throat: Oropharynx clear, dentition fair Lymphatic: No cervical or supraclavicular adenopathy Lungs no rales or rhonchi, good excursion bilaterally Heart regular rate and rhythm, no murmur appreciated Abd soft, nontender, positive bowel sounds MSK no focal spinal tenderness, no joint edema Neuro: non-focal, well-oriented, appropriate affect   Lab Results  Component Value Date   WBC 8.2 12/19/2023   HGB 11.3 (L) 12/19/2023   HCT 35.3 (L) 12/19/2023   MCV 86.1 12/19/2023   PLT 348 12/19/2023   Lab Results  Component  Value Date   FERRITIN 23 06/06/2023   IRON 28 06/06/2023   TIBC 391 06/06/2023   UIBC 363 06/06/2023   IRONPCTSAT 7 (L) 06/06/2023   Lab Results  Component Value Date   RETICCTPCT 2.4 12/19/2023   RBC 4.09 12/19/2023   No results found for: "KPAFRELGTCHN", "LAMBDASER", "KAPLAMBRATIO" Lab Results  Component Value Date   IGA 310 11/16/2009   No results found for: "TOTALPROTELP", "ALBUMINELP", "A1GS", "A2GS", "BETS", "BETA2SER", "GAMS", "MSPIKE", "SPEI"   Chemistry      Component Value Date/Time   NA 138 11/29/2022 0938   K 4.0 11/29/2022 0938   CL 106 11/29/2022 0938   CO2 25 11/29/2022 0938    BUN 9 11/29/2022 0938   CREATININE 0.63 11/29/2022 0938      Component Value Date/Time   CALCIUM 9.3 11/29/2022 0938   ALKPHOS 53 11/29/2022 0938   AST 13 11/29/2022 0938   ALT 8 11/29/2022 0938   BILITOT 0.4 11/29/2022 2841      Encounter Diagnoses  Name Primary?   Iron deficiency anemia due to chronic blood loss Yes   Menorrhagia with regular cycle     Impression and Plan: Ms. Beazer is a very pleasant 38 yo African American female with iron deficiency anemia secondary to heavy cycles.   CBC  shows a slightly improved Hgb of 11.3. Normal MCV. Slightly elevated immature retic fract Iron studied pending. Will replace if needed.   RTC 6 months APP, labs (CBC, retic, iron, ferritin)-  Rushie Chestnut, PA-C 2/11/202510:29 AM

## 2023-12-26 ENCOUNTER — Encounter: Payer: Self-pay | Admitting: Hematology & Oncology

## 2023-12-29 ENCOUNTER — Ambulatory Visit (INDEPENDENT_AMBULATORY_CARE_PROVIDER_SITE_OTHER): Payer: Medicaid Other | Admitting: Internal Medicine

## 2023-12-29 ENCOUNTER — Encounter: Payer: Self-pay | Admitting: Internal Medicine

## 2023-12-29 VITALS — BP 118/70 | HR 78 | Temp 98.0°F | Ht 60.0 in | Wt 191.0 lb

## 2023-12-29 DIAGNOSIS — D500 Iron deficiency anemia secondary to blood loss (chronic): Secondary | ICD-10-CM

## 2023-12-29 DIAGNOSIS — J0100 Acute maxillary sinusitis, unspecified: Secondary | ICD-10-CM

## 2023-12-29 DIAGNOSIS — E559 Vitamin D deficiency, unspecified: Secondary | ICD-10-CM | POA: Diagnosis not present

## 2023-12-29 LAB — COMPREHENSIVE METABOLIC PANEL
ALT: 8 U/L (ref 0–35)
AST: 12 U/L (ref 0–37)
Albumin: 4.3 g/dL (ref 3.5–5.2)
Alkaline Phosphatase: 57 U/L (ref 39–117)
BUN: 9 mg/dL (ref 6–23)
CO2: 27 meq/L (ref 19–32)
Calcium: 9.5 mg/dL (ref 8.4–10.5)
Chloride: 104 meq/L (ref 96–112)
Creatinine, Ser: 0.76 mg/dL (ref 0.40–1.20)
GFR: 100.77 mL/min (ref >60.00)
Glucose, Bld: 84 mg/dL (ref 70–99)
Potassium: 3.9 meq/L (ref 3.5–5.1)
Sodium: 138 meq/L (ref 135–145)
Total Bilirubin: 0.5 mg/dL (ref 0.2–1.2)
Total Protein: 7.8 g/dL (ref 6.0–8.3)

## 2023-12-29 LAB — VITAMIN D 25 HYDROXY (VIT D DEFICIENCY, FRACTURES): VITD: 20.13 ng/mL — ABNORMAL LOW (ref 30.00–100.00)

## 2023-12-29 MED ORDER — CEFDINIR 300 MG PO CAPS: 300.0000 mg | ORAL_CAPSULE | Freq: Two times a day (BID) | ORAL | 0 refills | Status: DC

## 2023-12-29 NOTE — Assessment & Plan Note (Signed)
 On Vit D

## 2023-12-29 NOTE — Progress Notes (Signed)
Subjective:  Patient ID: [name], female    DOB: [birth year]  Age: 38 y.o. MRN: [id number]  CC: Medical Management of Chronic Issues (Pt states she is having a lot if nasal mucus drainage that is thick and green... Pt wants to Discuss Vitamin D and blood glucose. Pt is wanting labs to check these levels as well)   HPI [name] presents for Pt states she is having a lot if nasal mucus drainage that is thick and green...x 2 months.  Pt wants to Discuss Vitamin D and blood glucose. Pt is wanting labs to check these levels as well Is she diabetic?  Outpatient Medications Prior to Visit  Medication Sig Dispense Refill   Cholecalciferol (VITAMIN D3) 50 MCG (2000 UT) capsule Take 2 capsules (4,000 Units total) by mouth daily. 180 capsule 3   etonogestrel-ethinyl estradiol (NUVARING) 0.12-0.015 MG/24HR vaginal ring Insert vaginally and leave in place for 3 consecutive weeks, then remove for 1 week. (Patient not taking: Reported on 12/19/2023) 1 each 12   Vitamin D, Ergocalciferol, (DRISDOL) 1.25 MG (50000 UNIT) CAPS capsule Take 1 capsule (50,000 Units total) by mouth every 7 (seven) days. (Patient not taking: Reported on 12/19/2023) 8 capsule 0   famotidine (PEPCID) 40 MG tablet Take 1 tablet (40 mg total) by mouth daily. (Patient not taking: Reported on 12/19/2023) 90 tablet 3   polyethylene glycol powder (GLYCOLAX/MIRALAX) 17 GM/SCOOP powder Take 17 g by mouth daily as needed. (Patient not taking: Reported on 12/19/2023) 500 g 6   Prenatal-DSS-FeCb-FeGl-FA (CITRANATAL BLOOM) 90-1 MG TABS Take 1 tablet by mouth daily before breakfast. (Patient not taking: Reported on 12/19/2023) 30 tablet 11   No facility-administered medications prior to visit.    ROS: Review of Systems  Constitutional:  Negative for activity change, appetite change, chills, fatigue and unexpected weight change.  HENT:  Positive for congestion, postnasal drip, rhinorrhea, sinus pain, sore throat and voice change. Negative  for hearing loss, mouth sores, sinus pressure and trouble swallowing.   Eyes:  Negative for visual disturbance.  Respiratory:  Negative for cough, chest tightness and wheezing.   Gastrointestinal:  Negative for abdominal pain and nausea.  Genitourinary:  Negative for difficulty urinating, frequency and vaginal pain.  Musculoskeletal:  Negative for back pain and gait problem.  Skin:  Negative for pallor and rash.  Neurological:  Negative for dizziness, tremors, weakness, numbness and headaches.  Psychiatric/Behavioral:  Negative for confusion, sleep disturbance and suicidal ideas.     Objective:  BP 118/70   Pulse 78   Temp 98 F (36.7 C) (Oral)   Ht 5' (1.524 m)   Wt 191 lb (86.6 kg)   LMP 12/09/2023 (Exact Date)   SpO2 98%   BMI 37.30 kg/m   BP Readings from Last 3 Encounters:  12/29/23 118/70  12/19/23 105/70  10/13/23 117/76    Wt Readings from Last 3 Encounters:  12/29/23 191 lb (86.6 kg)  12/19/23 191 lb 1.3 oz (86.7 kg)  10/13/23 195 lb (88.5 kg)    Physical Exam Constitutional:      General: She is not in acute distress.    Appearance: She is well-developed.  HENT:     Head: Normocephalic.     Right Ear: External ear normal.     Left Ear: External ear normal.     Nose: Nose normal.  Eyes:     General:        Right eye: No discharge.  Left eye: No discharge.     Conjunctiva/sclera: Conjunctivae normal.     Pupils: Pupils are equal, round, and reactive to light.  Neck:     Thyroid: No thyromegaly.     Vascular: No JVD.     Trachea: No tracheal deviation.  Cardiovascular:     Rate and Rhythm: Normal rate and regular rhythm.     Heart sounds: Normal heart sounds.  Pulmonary:     Effort: No respiratory distress.     Breath sounds: No stridor. No wheezing.  Abdominal:     General: Bowel sounds are normal. There is no distension.     Palpations: Abdomen is soft. There is no mass.     Tenderness: There is no abdominal tenderness. There is no  guarding or rebound.  Musculoskeletal:        General: No tenderness.     Cervical back: Normal range of motion and neck supple. No rigidity.  Lymphadenopathy:     Cervical: No cervical adenopathy.  Skin:    Findings: No erythema or rash.  Neurological:     Cranial Nerves: No cranial nerve deficit.     Motor: No abnormal muscle tone.     Coordination: Coordination normal.     Deep Tendon Reflexes: Reflexes normal.  Psychiatric:        Behavior: Behavior normal.        Thought Content: Thought content normal.        Judgment: Judgment normal.     Lab Results  Component Value Date   WBC 8.2 12/19/2023   HGB 11.3 (L) 12/19/2023   HCT 35.3 (L) 12/19/2023   PLT 348 12/19/2023   GLUCOSE 95 11/29/2022   CHOL 143 12/17/2021   TRIG 41.0 12/17/2021   HDL 45.30 12/17/2021   LDLCALC 90 12/17/2021   ALT 8 11/29/2022   AST 13 11/29/2022   NA 138 11/29/2022   K 4.0 11/29/2022   CL 106 11/29/2022   CREATININE 0.63 11/29/2022   BUN 9 11/29/2022   CO2 25 11/29/2022   TSH 2.650 08/15/2023   HGBA1C 5.5 08/10/2023    US PELVIC COMPLETE WITH TRANSVAGINAL Result Date: 12/09/2023 CLINICAL DATA:  Left ovarian cyst EXAM: TRANSABDOMINAL AND TRANSVAGINAL ULTRASOUND OF PELVIS TECHNIQUE: Both transabdominal and transvaginal ultrasound examinations of the pelvis were performed. Transabdominal technique was performed for global imaging of the pelvis including uterus, ovaries, adnexal regions, and pelvic cul-de-sac. It was necessary to proceed with endovaginal exam following the transabdominal exam to visualize the left ovary. COMPARISON:  09/01/2023 FINDINGS: Uterus Measurements: 6.6 x 5.3 x 5.3 cm = volume: 97 mL. The uterus is retroverted. The cervix is unremarkable. A heterogeneously hypoechoic mass is seen within the submucosal posterior uterine body measuring 1.4 x 0.8 x 1.8 cm compatible with a subserosal uterine fibroid. Additional exophytic heterogeneously hypoechoic mass arises from the fundus  measuring 1.91.5 x 1.8 cm compatible with a second exophytic uterine fibroid. Endometrium Thickness: 12 mm.  No focal abnormality visualized. Right ovary Measurements: 4.4 x 2.1 x 1.7 cm = volume: 8 mL. A complex cystic lesion is seen right ovary measuring 2.2 x 1.4 x 1.5 cm stem straightening no significant internal vascularity but extensive internal echogenic material possibly representing a hemorrhagic cyst. Left ovary Measurements: 3.3 x 1.7 x 2.7 cm = volume: 8 mL. The left ovary again contains a 1.1 x 1.2 x 1.3 cm homogeneously hyperechoic focus demonstrating echogenic posterior shadowing compatible with a focus of macroscopic and in keeping with a small dermoid  cyst. Other findings No abnormal free fluid. IMPRESSION: 1. 1.3 cm left ovarian dermoid cyst. 2. 2.2 cm complex cystic lesion within the right ovary, possibly representing a hemorrhagic cyst. Follow-up ultrasound in 6-12 weeks could be performed to assess for resolution. 3. Uterine fibroids. Electronically Signed   By: Helyn Numbers M.D.   On: 12/09/2023 01:50    Assessment & Plan:   Problem List Items Addressed This Visit     Vitamin D deficiency   On Vit D      Relevant Orders   Comprehensive metabolic panel   VITAMIN D 25 Hydroxy (Vit-D Deficiency, Fractures)   Iron deficiency anemia - Primary   On iron infusions Check CBC, iron      Relevant Orders   Comprehensive metabolic panel   Acute sinusitis   New Start Omnicef x 10 d       Relevant Medications   cefdinir (OMNICEF) 300 MG capsule   Other Relevant Orders   Comprehensive metabolic panel      Meds ordered this encounter  Medications   cefdinir (OMNICEF) 300 MG capsule    Sig: Take 1 capsule (300 mg total) by mouth 2 (two) times daily.    Dispense:  20 capsule    Refill:  0      Follow-up: Return in about 6 months (around 06/27/2024) for Wellness Exam.  Sonda Primes, MD

## 2023-12-29 NOTE — Assessment & Plan Note (Signed)
New Start Omnicef x 10 d

## 2023-12-29 NOTE — Assessment & Plan Note (Signed)
On iron infusions Check CBC, iron

## 2024-01-01 ENCOUNTER — Other Ambulatory Visit: Payer: Self-pay | Admitting: Internal Medicine

## 2024-01-01 ENCOUNTER — Encounter: Payer: Self-pay | Admitting: Internal Medicine

## 2024-01-01 MED ORDER — VITAMIN D (ERGOCALCIFEROL) 1.25 MG (50000 UNIT) PO CAPS: 50000.0000 [IU] | ORAL_CAPSULE | ORAL | 0 refills | Status: DC

## 2024-01-04 ENCOUNTER — Ambulatory Visit: Payer: 59 | Admitting: Internal Medicine

## 2024-01-06 DIAGNOSIS — Z419 Encounter for procedure for purposes other than remedying health state, unspecified: Secondary | ICD-10-CM | POA: Diagnosis not present

## 2024-01-09 ENCOUNTER — Encounter: Payer: Self-pay | Admitting: Hematology & Oncology

## 2024-01-29 ENCOUNTER — Ambulatory Visit: Payer: Self-pay

## 2024-01-29 NOTE — Telephone Encounter (Signed)
 Copied from CRM [id number]. Topic: Clinical - Red Word Triage >> Jan 29, 2024 10:26 AM Marica Otter wrote: [facility] that prompted transfer to Nurse Triage: Larey Seat off horse and hurt back and side a week ago and having pain.  Chief Complaint: back pain  Symptoms: 7/10 low back pain & left side pain  Frequency: started 01/20/2024 Pertinent Negatives: Patient denies numbness or tingling Disposition: [] ED /[] Urgent Care (no appt availability in office) / [x] Appointment(In office/virtual)/ []  [facility] [place] [facility]/ [] Home Care/ [] Refused Recommended Disposition /[] [place] [facility]/ []  Follow-up with PCP Additional Notes: pt feel from horse on 01/20/2024. Has been having low back and left side pain ever since.  Pt thought it was getting better but now seems worse: would like to be evaluated.  Reason for Disposition  [1] MODERATE back pain (e.g., interferes with normal activities) AND [2] present > 3 days  Answer Assessment - Initial Assessment Questions 1. ONSET: "When did the pain begin?"      X 1 week 2. LOCATION: "Where does it hurt?" (upper, mid or lower back)     Lower back and left side 3. SEVERITY: "How bad is the pain?"  (e.g., Scale 1-10; mild, moderate, or severe)   - MILD (1-3): Doesn't interfere with normal activities.    - MODERATE (4-7): Interferes with normal activities or awakens from sleep.    - SEVERE (8-10): Excruciating pain, unable to do any normal activities.      6 to 7/10 4. PATTERN: "Is the pain constant?" (e.g., yes, no; constant, intermittent)      constant 5. RADIATION: "Does the pain shoot into your legs or somewhere else?"     no 6. CAUSE:  "What do you think is causing the back pain?"      Fell from horse on 01/20/2024 7. BACK OVERUSE:  "Any recent lifting of heavy objects, strenuous work or exercise?"     Fell from horse 8. MEDICINES: "What have you taken so far for the pain?" (e.g., nothing, acetaminophen, NSAIDS)     Ice and heat, tylenol and ibuprofen  without relief 9. NEUROLOGIC SYMPTOMS: "Do you have any weakness, numbness, or problems with bowel/bladder control?"     no 10. OTHER SYMPTOMS: "Do you have any other symptoms?" (e.g., fever, abdomen pain, burning with urination, blood in urine)       no 11. PREGNANCY: "Is there any chance you are pregnant?" "When was your last menstrual period?"       no  Protocols used: Back Pain-A-AH

## 2024-01-30 ENCOUNTER — Ambulatory Visit (INDEPENDENT_AMBULATORY_CARE_PROVIDER_SITE_OTHER): Admitting: Internal Medicine

## 2024-01-30 ENCOUNTER — Ambulatory Visit (INDEPENDENT_AMBULATORY_CARE_PROVIDER_SITE_OTHER)

## 2024-01-30 ENCOUNTER — Encounter: Payer: Self-pay | Admitting: Internal Medicine

## 2024-01-30 VITALS — BP 118/74 | HR 84 | Temp 99.2°F | Ht 60.0 in | Wt 192.0 lb

## 2024-01-30 DIAGNOSIS — R0789 Other chest pain: Secondary | ICD-10-CM

## 2024-01-30 DIAGNOSIS — M549 Dorsalgia, unspecified: Secondary | ICD-10-CM | POA: Diagnosis not present

## 2024-01-30 DIAGNOSIS — S299XXA Unspecified injury of thorax, initial encounter: Secondary | ICD-10-CM | POA: Diagnosis not present

## 2024-01-30 DIAGNOSIS — R079 Chest pain, unspecified: Secondary | ICD-10-CM | POA: Diagnosis not present

## 2024-01-30 NOTE — Progress Notes (Signed)
 Subjective:  Patient ID: [name], female    DOB: [birth year]  Age: 38 y.o. MRN: [id number]  CC: Back Pain (Back/left side pain. Patient notes of fall on 03/15 which seems to have caused this pain. 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 Notes no bruising, discoloration, or swelling on that side. Treating with ice, heat, ibuprofen. )   HPI [name] presents for L side pain Back/left side pain. Patient notes of fall on 03/15 (fell off the horse) which seems to have caused this pain. 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 Notes no bruising, discoloration, or swelling on that side. Treating with ice, heat, ibuprofen. No LOC. Feeling better now. Pain is 6/10 at times  Outpatient Medications Prior to Visit  Medication Sig Dispense Refill   cefdinir (OMNICEF) 300 MG capsule Take 1 capsule (300 mg total) by mouth 2 (two) times daily. 20 capsule 0   Cholecalciferol (VITAMIN D3) 50 MCG (2000 UT) capsule Take 2 capsules (4,000 Units total) by mouth daily. 180 capsule 3   Vitamin D, Ergocalciferol, (DRISDOL) 1.25 MG (50000 UNIT) CAPS capsule Take 1 capsule (50,000 Units total) by mouth every 7 (seven) days. 8 capsule 0   etonogestrel-ethinyl estradiol (NUVARING) 0.12-0.015 MG/24HR vaginal ring Insert vaginally and leave in place for 3 consecutive weeks, then remove for 1 week. (Patient not taking: Reported on 01/30/2024) 1 each 12   No facility-administered medications prior to visit.    ROS: Review of Systems  Constitutional:  Negative for activity change, appetite change, chills, fatigue and unexpected weight change.  HENT:  Negative for congestion, mouth sores and sinus pressure.   Eyes:  Negative for visual disturbance.  Respiratory:  Negative for cough, chest tightness, shortness of breath and wheezing.   Cardiovascular:  Positive for chest pain.  Negative for palpitations and leg swelling.  Gastrointestinal:  Negative for abdominal pain and nausea.  Genitourinary:  Negative for decreased urine volume, difficulty urinating, flank pain, frequency, hematuria, urgency and vaginal pain.  Musculoskeletal:  Negative for back pain and gait problem.  Skin:  Negative for pallor and rash.  Neurological:  Negative for dizziness, tremors, weakness, light-headedness, numbness and headaches.  Hematological:  Does not bruise/bleed easily.  Psychiatric/Behavioral:  Negative for confusion and sleep disturbance. The patient is not nervous/anxious.     Objective:  BP 118/74   Pulse 84   Temp 99.2 F (37.3 C)   Ht 5' (1.524 m)   Wt 192 lb (87.1 kg)   SpO2 97%   BMI 37.50 kg/m   BP Readings from Last 3 Encounters:  01/30/24 118/74  12/29/23 118/70  12/19/23 105/70    Wt Readings from Last 3 Encounters:  01/30/24 192 lb (87.1 kg)  12/29/23 191 lb (86.6 kg)  12/19/23 191 lb 1.3 oz (86.7 kg)    Physical Exam Constitutional:      General: She is not in acute distress.    Appearance: She is well-developed. She is obese.  HENT:     Head: Normocephalic.     Right Ear: External ear normal.     Left Ear: External ear normal.     Nose: Nose normal.  Eyes:     General:        Right eye: No discharge.        Left eye: No discharge.     Conjunctiva/sclera: Conjunctivae normal.  Pupils: Pupils are equal, round, and reactive to light.  Neck:     Thyroid: No thyromegaly.     Vascular: No JVD.     Trachea: No tracheal deviation.  Cardiovascular:     Rate and Rhythm: Normal rate and regular rhythm.     Heart sounds: Normal heart sounds.  Pulmonary:     Effort: No respiratory distress.     Breath sounds: No stridor. No wheezing.  Abdominal:     General: Bowel sounds are normal. There is no distension.     Palpations: Abdomen is soft. There is no mass.     Tenderness: There is no abdominal tenderness. There is no guarding or rebound.   Musculoskeletal:        General: No tenderness.     Cervical back: Normal range of motion and neck supple. No rigidity.  Lymphadenopathy:     Cervical: No cervical adenopathy.  Skin:    Findings: No erythema or rash.  Neurological:     Cranial Nerves: No cranial nerve deficit.     Motor: No abnormal muscle tone.     Coordination: Coordination normal.     Deep Tendon Reflexes: Reflexes normal.  Psychiatric:        Behavior: Behavior normal.        Thought Content: Thought content normal.        Judgment: Judgment normal.   L lower posterior ribs are sensitive  Lab Results  Component Value Date   WBC 8.2 12/19/2023   HGB 11.3 (L) 12/19/2023   HCT 35.3 (L) 12/19/2023   PLT 348 12/19/2023   GLUCOSE 84 12/29/2023   CHOL 143 12/17/2021   TRIG 41.0 12/17/2021   HDL 45.30 12/17/2021   LDLCALC 90 12/17/2021   ALT 8 12/29/2023   AST 12 12/29/2023   NA 138 12/29/2023   K 3.9 12/29/2023   CL 104 12/29/2023   CREATININE 0.76 12/29/2023   BUN 9 12/29/2023   CO2 27 12/29/2023   TSH 2.650 08/15/2023   HGBA1C 5.5 08/10/2023    US PELVIC COMPLETE WITH TRANSVAGINAL Result Date: 12/09/2023 CLINICAL DATA:  Left ovarian cyst EXAM: TRANSABDOMINAL AND TRANSVAGINAL ULTRASOUND OF PELVIS TECHNIQUE: Both transabdominal and transvaginal ultrasound examinations of the pelvis were performed. Transabdominal technique was performed for global imaging of the pelvis including uterus, ovaries, adnexal regions, and pelvic cul-de-sac. It was necessary to proceed with endovaginal exam following the transabdominal exam to visualize the left ovary. COMPARISON:  09/01/2023 FINDINGS: Uterus Measurements: 6.6 x 5.3 x 5.3 cm = volume: 97 mL. The uterus is retroverted. The cervix is unremarkable. A heterogeneously hypoechoic mass is seen within the submucosal posterior uterine body measuring 1.4 x 0.8 x 1.8 cm compatible with a subserosal uterine fibroid. Additional exophytic heterogeneously hypoechoic mass arises  from the fundus measuring 1.91.5 x 1.8 cm compatible with a second exophytic uterine fibroid. Endometrium Thickness: 12 mm.  No focal abnormality visualized. Right ovary Measurements: 4.4 x 2.1 x 1.7 cm = volume: 8 mL. A complex cystic lesion is seen right ovary measuring 2.2 x 1.4 x 1.5 cm stem straightening no significant internal vascularity but extensive internal echogenic material possibly representing a hemorrhagic cyst. Left ovary Measurements: 3.3 x 1.7 x 2.7 cm = volume: 8 mL. The left ovary again contains a 1.1 x 1.2 x 1.3 cm homogeneously hyperechoic focus demonstrating echogenic posterior shadowing compatible with a focus of macroscopic and in keeping with a small dermoid cyst. Other findings No abnormal free fluid. IMPRESSION: 1. 1.3  cm left ovarian dermoid cyst. 2. 2.2 cm complex cystic lesion within the right ovary, possibly representing a hemorrhagic cyst. Follow-up ultrasound in 6-12 weeks could be performed to assess for resolution. 3. Uterine fibroids. Electronically Signed   By: Helyn Numbers M.D.   On: 12/09/2023 01:50    Assessment & Plan:   Problem List Items Addressed This Visit     Other chest pain - Primary   Back/left side pain. Patient notes of fall on 03/15 (fell off the horse) which seems to have caused this pain. 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 Notes no bruising, discoloration, or swelling on that side. Treating with ice, heat, ibuprofen. No LOC. Feeling better now. Pain is 6/10 CXR Ribs X ray Rib belt Pt declined Rx      Relevant Orders   DG Chest 2 View   DG Ribs Unilateral Left   Fall from horse   Back/left side pain. Patient notes of fall on 03/15 (fell off the horse) which seems to have caused this pain. 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 Notes no bruising, discoloration, or swelling on that side. Treating with  ice, heat, ibuprofen. No LOC. Feeling better now. Pain is 6/10 CXR Ribs X ray Rib belt Pt declined Rx         No orders of the defined types were placed in this encounter.     Follow-up: No follow-ups on file.  Sonda Primes, MD

## 2024-01-30 NOTE — Assessment & Plan Note (Signed)
 Back/left side pain. Patient notes of fall on 03/15 (fell off the horse) which seems to have caused this pain. 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 Notes no bruising, discoloration, or swelling on that side. Treating with ice, heat, ibuprofen. No LOC. Feeling better now. Pain is 6/10 CXR Ribs X ray Rib belt Pt declined Rx

## 2024-01-31 ENCOUNTER — Encounter: Payer: Self-pay | Admitting: Internal Medicine

## 2024-02-17 DIAGNOSIS — Z419 Encounter for procedure for purposes other than remedying health state, unspecified: Secondary | ICD-10-CM | POA: Diagnosis not present

## 2024-03-13 ENCOUNTER — Ambulatory Visit (HOSPITAL_BASED_OUTPATIENT_CLINIC_OR_DEPARTMENT_OTHER)
Admission: RE | Admit: 2024-03-13 | Discharge: 2024-03-13 | Disposition: A | Discharge status: Home / Self Care | Source: Ambulatory Visit | Attending: Obstetrics and Gynecology | Admitting: Obstetrics and Gynecology

## 2024-03-13 DIAGNOSIS — D259 Leiomyoma of uterus, unspecified: Secondary | ICD-10-CM | POA: Diagnosis not present

## 2024-03-13 DIAGNOSIS — N83201 Unspecified ovarian cyst, right side: Secondary | ICD-10-CM | POA: Insufficient documentation

## 2024-03-13 DIAGNOSIS — N83202 Unspecified ovarian cyst, left side: Secondary | ICD-10-CM | POA: Diagnosis not present

## 2024-03-18 DIAGNOSIS — Z419 Encounter for procedure for purposes other than remedying health state, unspecified: Secondary | ICD-10-CM | POA: Diagnosis not present

## 2024-03-19 ENCOUNTER — Ambulatory Visit: Payer: Self-pay | Admitting: Obstetrics and Gynecology

## 2024-04-02 ENCOUNTER — Encounter: Payer: Self-pay | Admitting: Obstetrics and Gynecology

## 2024-04-02 ENCOUNTER — Ambulatory Visit (INDEPENDENT_AMBULATORY_CARE_PROVIDER_SITE_OTHER): Admitting: Obstetrics and Gynecology

## 2024-04-02 VITALS — BP 117/79 | HR 77 | Wt 191.0 lb

## 2024-04-02 DIAGNOSIS — N83201 Unspecified ovarian cyst, right side: Secondary | ICD-10-CM

## 2024-04-02 DIAGNOSIS — Z712 Person consulting for explanation of examination or test findings: Secondary | ICD-10-CM | POA: Diagnosis not present

## 2024-04-02 NOTE — Progress Notes (Signed)
 Pt is in office to review u/s results.

## 2024-04-02 NOTE — Progress Notes (Signed)
 38 yo P1 returning to the office to discuss rest results. Patient reports feeling well without new complaints. She is still planning on conceiving in the next year or two  Past Medical History:  Diagnosis Date   Anemia    no current med.   Fracture of radius, distal, right, closed 03/20/2015   GERD (gastroesophageal reflux disease)    OTC as needed   History of cardiac murmur    states workup 2013 - no problems identified   Obesity    Radius fracture 03/16/2015   right   Past Surgical History:  Procedure Laterality Date   CESAREAN SECTION  03/15/2006   COLONOSCOPY WITH ESOPHAGOGASTRODUODENOSCOPY (EGD)  01/18/2023   OPEN REDUCTION INTERNAL FIXATION (ORIF) DISTAL RADIAL FRACTURE Right 03/20/2015   Procedure: OPEN REDUCTION INTERNAL FIXATION RIGHT DISTAL RADIUS;  Surgeon: Osa Blase, MD;  Location: [facility];  Service: Orthopedics;  Laterality: Right;   Family History  Problem Relation Age of Onset   Heart failure Paternal Grandmother    Diabetes Paternal Grandmother    Esophageal cancer Neg Hx    Colon cancer Neg Hx    Liver disease Neg Hx     Social History   Tobacco Use   Smoking status: Never    Passive exposure: Never   Smokeless tobacco: Never  Vaping Use   Vaping status: Never Used  Substance Use Topics   Alcohol use: Yes    Comment: occasionally   Drug use: No   ROS See pertinent in HPI. All other systems reviewed and non contributory Blood pressure 117/79, pulse 77, weight 191 lb (86.6 kg), last menstrual period 03/28/2024.  GENERAL: Well-developed, well-nourished female in no acute distress.  NEURO: alert and oriented x 3  US  PELVIC COMPLETE WITH TRANSVAGINAL Result Date: 03/19/2024 CLINICAL DATA:  Follow-up previous exam.  Left ovarian cyst. EXAM: TRANSABDOMINAL AND TRANSVAGINAL ULTRASOUND OF PELVIS TECHNIQUE: Both transabdominal and transvaginal ultrasound examinations of the pelvis were performed. Transabdominal technique was performed  for global imaging of the pelvis including uterus, ovaries, adnexal regions, and pelvic cul-de-sac. It was necessary to proceed with endovaginal exam following the transabdominal exam to visualize the uterus, endometrium and ovaries to better advantage. COMPARISON:  Ultrasounds dated 12/04/2023 and 09/01/2023. FINDINGS: Uterus Measurements: 8.6 x 4.3 x 5.6 cm = volume: 109.4 mL. Two small hypoechoic fibroids, both neural, right mid uterine segment, 1.1 x 0.8 x 1.2 cm. Right fundus, 1.9 x 1.8 x 1.8 cm. No other uterine masses. Endometrium Thickness: 10 mm.  No focal abnormality visualized. Right ovary Measurements: 4.0 x 2.2 x 2.6 cm = volume: 12 mL. Thick-walled complicated cyst measuring 2.0 x 1.8 x 1.6 cm, similar in size to the prior ultrasound, but more cystic in appearance. Ovary otherwise unremarkable. No adnexal masses. Left ovary Measurements: 2.5 x 1.4 x 2.3 cm = volume: 4.3 mL. Focal echogenic lesion, 1.2 x 1.0 x 1.1 cm, unchanged from the prior exam. No adnexal masses. Other findings No abnormal free fluid. IMPRESSION: 1. No acute findings. 2. Stable, 1.2 cm hyperechoic left ovarian lesion consistent with a dermoid. 3. Complicated 2 cm right ovarian cyst, stable in size from the prior ultrasound, but more cystic in appearance consistent with a benign etiology. No additional follow-up recommended. 4. 2 uterine fibroids as detailed. Electronically Signed   By: Amanda Jungling M.D.   On: 03/19/2024 13:59     A/P 38 yo here to discuss ultrasound report - Reviewed ultrasound report demonstrating the presence of a 1.2 cm dermoid  cyst, stable from previous and a stable cyst on right ovary - Discussed nature of dermoid cyst - Reassurance provided - Plan on surgical interventions post conception - RTC prn

## 2024-04-18 DIAGNOSIS — Z419 Encounter for procedure for purposes other than remedying health state, unspecified: Secondary | ICD-10-CM | POA: Diagnosis not present

## 2024-04-19 ENCOUNTER — Inpatient Hospital Stay: Attending: Hematology & Oncology

## 2024-04-19 VITALS — BP 108/77 | HR 64 | Temp 98.2°F | Resp 19

## 2024-04-19 DIAGNOSIS — N920 Excessive and frequent menstruation with regular cycle: Secondary | ICD-10-CM | POA: Insufficient documentation

## 2024-04-19 DIAGNOSIS — D500 Iron deficiency anemia secondary to blood loss (chronic): Secondary | ICD-10-CM | POA: Insufficient documentation

## 2024-04-19 MED ORDER — SODIUM CHLORIDE 0.9 % IV SOLN: INTRAVENOUS | Status: DC

## 2024-04-19 MED ORDER — SODIUM CHLORIDE 0.9 % IV SOLN
300.0000 mg | Freq: Once | INTRAVENOUS | Status: AC
  Administered 2024-04-19: 300 mg via INTRAVENOUS
  Filled 2024-04-19: qty 300

## 2024-04-19 NOTE — Progress Notes (Signed)
 Refused to wait 30 minutes post infusion. Released stable and ASX.

## 2024-04-19 NOTE — Patient Instructions (Signed)

## 2024-04-29 ENCOUNTER — Inpatient Hospital Stay

## 2024-05-02 ENCOUNTER — Inpatient Hospital Stay

## 2024-05-02 VITALS — BP 119/62 | HR 64 | Temp 98.2°F | Resp 17

## 2024-05-02 DIAGNOSIS — D500 Iron deficiency anemia secondary to blood loss (chronic): Secondary | ICD-10-CM | POA: Diagnosis not present

## 2024-05-02 DIAGNOSIS — N920 Excessive and frequent menstruation with regular cycle: Secondary | ICD-10-CM

## 2024-05-02 MED ORDER — SODIUM CHLORIDE 0.9 % IV SOLN
300.0000 mg | Freq: Once | INTRAVENOUS | Status: AC
  Administered 2024-05-02: 300 mg via INTRAVENOUS
  Filled 2024-05-02: qty 300

## 2024-05-02 MED ORDER — SODIUM CHLORIDE 0.9 % IV SOLN: INTRAVENOUS | Status: DC

## 2024-05-02 NOTE — Progress Notes (Signed)
Pt declined to stay for post infusion observation period. Pt stated she has tolerated medication multiple times prior without difficulty. Pt aware to call clinic with any questions or concerns. Pt verbalized understanding and had no further questions.  ? ?

## 2024-05-02 NOTE — Patient Instructions (Signed)

## 2024-05-30 ENCOUNTER — Encounter: Payer: Self-pay | Admitting: Internal Medicine

## 2024-05-30 DIAGNOSIS — N390 Urinary tract infection, site not specified: Secondary | ICD-10-CM | POA: Diagnosis not present

## 2024-06-14 ENCOUNTER — Other Ambulatory Visit: Payer: Self-pay

## 2024-06-14 DIAGNOSIS — D500 Iron deficiency anemia secondary to blood loss (chronic): Secondary | ICD-10-CM

## 2024-06-17 ENCOUNTER — Inpatient Hospital Stay (HOSPITAL_BASED_OUTPATIENT_CLINIC_OR_DEPARTMENT_OTHER): Payer: 59 | Admitting: Medical Oncology

## 2024-06-17 ENCOUNTER — Inpatient Hospital Stay: Payer: Medicaid Other | Attending: Medical Oncology

## 2024-06-17 VITALS — BP 113/62 | HR 74 | Temp 98.7°F | Resp 17 | Ht 60.0 in | Wt 190.4 lb

## 2024-06-17 DIAGNOSIS — D500 Iron deficiency anemia secondary to blood loss (chronic): Secondary | ICD-10-CM | POA: Diagnosis not present

## 2024-06-17 DIAGNOSIS — D259 Leiomyoma of uterus, unspecified: Secondary | ICD-10-CM | POA: Diagnosis not present

## 2024-06-17 DIAGNOSIS — N920 Excessive and frequent menstruation with regular cycle: Secondary | ICD-10-CM

## 2024-06-17 DIAGNOSIS — Z79899 Other long term (current) drug therapy: Secondary | ICD-10-CM | POA: Diagnosis not present

## 2024-06-17 LAB — CBC WITH DIFFERENTIAL (CANCER CENTER ONLY)
Abs Immature Granulocytes: 0.05 K/uL (ref 0.00–0.07)
Basophils Absolute: 0.1 K/uL (ref 0.0–0.1)
Basophils Relative: 1 %
Eosinophils Absolute: 0.2 K/uL (ref 0.0–0.5)
Eosinophils Relative: 3 %
HCT: 36 % (ref 36.0–46.0)
Hemoglobin: 11.7 g/dL — ABNORMAL LOW (ref 12.0–15.0)
Immature Granulocytes: 1 %
Lymphocytes Relative: 45 %
Lymphs Abs: 3.4 K/uL (ref 0.7–4.0)
MCH: 27.2 pg (ref 26.0–34.0)
MCHC: 32.5 g/dL (ref 30.0–36.0)
MCV: 83.7 fL (ref 80.0–100.0)
Monocytes Absolute: 0.6 K/uL (ref 0.1–1.0)
Monocytes Relative: 8 %
Neutro Abs: 3.2 K/uL (ref 1.7–7.7)
Neutrophils Relative %: 42 %
Platelet Count: 317 K/uL (ref 150–400)
RBC: 4.3 MIL/uL (ref 3.87–5.11)
RDW: 14.2 % (ref 11.5–15.5)
WBC Count: 7.5 K/uL (ref 4.0–10.5)
nRBC: 0 % (ref 0.0–0.2)

## 2024-06-17 LAB — IRON AND IRON BINDING CAPACITY (CC-WL,HP ONLY)
Iron: 46 ug/dL (ref 28–170)
Saturation Ratios: 13 % (ref 10.4–31.8)
TIBC: 368 ug/dL (ref 250–450)
UIBC: 322 ug/dL

## 2024-06-17 LAB — RETICULOCYTES
Immature Retic Fract: 7.5 % (ref 2.3–15.9)
RBC.: 4.34 MIL/uL (ref 3.87–5.11)
Retic Count, Absolute: 84.6 K/uL (ref 19.0–186.0)
Retic Ct Pct: 2 % (ref 0.4–3.1)

## 2024-06-17 LAB — FERRITIN: Ferritin: 157 ng/mL (ref 11–307)

## 2024-06-17 NOTE — Progress Notes (Signed)
 Hematology and Oncology Follow Up Visit  [name] [id number] 1986/09/03 38 y.o. 06/17/2024   Principle Diagnosis:  Iron  deficiency anemia secondary to chronic blood loss due to menorrhagia   Current Therapy:        IV iron  as indicated - Venofer  300 mg - last infusion was on 05/02/2024  - failed oral iron  due to side effects (constipation/nausea)    Interim History:  [name] is here today for follow-up.   Today she reports feeling much better s/p IV iron .   Her cycle is regular with heavy flow. Day 2-3 are the most heavy days of cycle. She did find out that she has uterine fibroids.  She is not taking any oral iron  due to GI side effects. Diet is poor in iron  but she is working on this.  No pica, no fatigue. No SOB.  No other blood loss noted. No bruising or petechiae.  No fever, chills, n/v, cough, rash, SOB, chest pain, abdominal pain or changes in bowel or bladder habits.  No swelling, tenderness, numbness or tingling in her extremities.  No falls or syncope.  Appetite is good.  ECOG Performance Status: 1 - Symptomatic but completely ambulatory  Medications:  Allergies as of 06/17/2024   No Known Allergies      Medication List        Accurate as of June 17, 2024 10:31 AM. If you have any questions, ask your nurse or doctor.          cefdinir  300 MG capsule Commonly known as: OMNICEF  Take 1 capsule (300 mg total) by mouth 2 (two) times daily.   etonogestrel -ethinyl estradiol  0.12-0.015 MG/24HR vaginal ring Commonly known as: NUVARING Insert vaginally and leave in place for 3 consecutive weeks, then remove for 1 week.   Vitamin D  (Ergocalciferol ) 1.25 MG (50000 UNIT) Caps capsule Commonly known as: DRISDOL  Take 1 capsule (50,000 Units total) by mouth every 7 (seven) days.   Vitamin D3 50 MCG (2000 UT) capsule Take 2 capsules (4,000 Units total) by mouth daily.        Allergies: No Known Allergies  Past Medical History, Surgical history,  Social history, and Family History were reviewed and updated.  Review of Systems: All other 10 point review of systems is negative.   Physical Exam:  height is 5' (1.524 m) and weight is 190 lb 6.4 oz (86.4 kg). Her oral temperature is 98.7 F (37.1 C). Her blood pressure is 113/62 and her pulse is 74. Her respiration is 17 and oxygen saturation is 100%.   Wt Readings from Last 3 Encounters:  06/17/24 190 lb 6.4 oz (86.4 kg)  04/02/24 191 lb (86.6 kg)  01/30/24 192 lb (87.1 kg)    Ocular: Sclerae unicteric, pupils equal, round and reactive to light Ear-nose-throat: Oropharynx clear, dentition fair Lymphatic: No cervical or supraclavicular adenopathy Lungs no rales or rhonchi, good excursion bilaterally Heart regular rate and rhythm, no murmur appreciated Abd soft, nontender, positive bowel sounds MSK no focal spinal tenderness, no joint edema Neuro: non-focal, well-oriented, appropriate affect   Lab Results  Component Value Date   WBC 7.5 06/17/2024   HGB 11.7 (L) 06/17/2024   HCT 36.0 06/17/2024   MCV 83.7 06/17/2024   PLT 317 06/17/2024   Lab Results  Component Value Date   FERRITIN 68 12/19/2023   IRON  36 12/19/2023   TIBC 351 12/19/2023   UIBC 315 12/19/2023   IRONPCTSAT 10 (L) 12/19/2023   Lab Results  Component Value Date  RETICCTPCT 2.0 06/17/2024   RBC 4.30 06/17/2024   RBC 4.34 06/17/2024   No results found for: KPAFRELGTCHN, LAMBDASER, [facility] Lab Results  Component Value Date   IGA 310 11/16/2009   No results found for: STEPHANY CARLOTA BENSON MARKEL EARLA JOANNIE DOC VICK, SPEI   Chemistry      Component Value Date/Time   NA 138 12/29/2023 1402   K 3.9 12/29/2023 1402   CL 104 12/29/2023 1402   CO2 27 12/29/2023 1402   BUN 9 12/29/2023 1402   CREATININE 0.76 12/29/2023 1402      Component Value Date/Time   CALCIUM 9.5 12/29/2023 1402   ALKPHOS 57 12/29/2023 1402   AST 12 12/29/2023 1402   ALT 8  12/29/2023 1402   BILITOT 0.5 12/29/2023 1402      Encounter Diagnoses  Name Primary?   Iron  deficiency anemia due to chronic blood loss Yes   Menorrhagia with regular cycle    Impression and Plan: [name] is a very pleasant 38 yo African American female with iron  deficiency anemia secondary to heavy cycles/uterine fibroids.   CBC shows improvement with a Hgb of 11.7 Normal MCV.  Normal immature retic fract  Iron  studied pending. Will replace if needed.   RTC 6 months APP, labs (CBC, retic, iron , ferritin)-[place]  Lauraine CHRISTELLA Dais, PA-C 8/11/202510:31 AM

## 2024-06-19 ENCOUNTER — Encounter: Payer: Self-pay | Admitting: Internal Medicine

## 2024-06-19 ENCOUNTER — Ambulatory Visit: Admitting: Internal Medicine

## 2024-06-19 VITALS — BP 110/68 | HR 74 | Temp 98.5°F | Ht 60.0 in | Wt 191.0 lb

## 2024-06-19 DIAGNOSIS — R350 Frequency of micturition: Secondary | ICD-10-CM | POA: Diagnosis not present

## 2024-06-19 DIAGNOSIS — E559 Vitamin D deficiency, unspecified: Secondary | ICD-10-CM | POA: Diagnosis not present

## 2024-06-19 DIAGNOSIS — D500 Iron deficiency anemia secondary to blood loss (chronic): Secondary | ICD-10-CM | POA: Diagnosis not present

## 2024-06-19 LAB — COMPREHENSIVE METABOLIC PANEL WITH GFR
ALT: 9 U/L (ref 0–35)
AST: 11 U/L (ref 0–37)
Albumin: 4.2 g/dL (ref 3.5–5.2)
Alkaline Phosphatase: 57 U/L (ref 39–117)
BUN: 7 mg/dL (ref 6–23)
CO2: 28 meq/L (ref 19–32)
Calcium: 8.9 mg/dL (ref 8.4–10.5)
Chloride: 103 meq/L (ref 96–112)
Creatinine, Ser: 0.67 mg/dL (ref 0.40–1.20)
GFR: 112.03 mL/min (ref >60.00)
Glucose, Bld: 76 mg/dL (ref 70–99)
Potassium: 4.1 meq/L (ref 3.5–5.1)
Sodium: 137 meq/L (ref 135–145)
Total Bilirubin: 0.4 mg/dL (ref 0.2–1.2)
Total Protein: 7.4 g/dL (ref 6.0–8.3)

## 2024-06-19 LAB — URINALYSIS, ROUTINE W REFLEX MICROSCOPIC
Bilirubin Urine: NEGATIVE
Ketones, ur: NEGATIVE
Leukocytes,Ua: NEGATIVE
Nitrite: POSITIVE — AB
Specific Gravity, Urine: 1.02 (ref 1.000–1.030)
Total Protein, Urine: 100 — AB
Urine Glucose: NEGATIVE
Urobilinogen, UA: 0.2 (ref 0.0–1.0)
pH: 8 (ref 5.0–8.0)

## 2024-06-19 NOTE — Assessment & Plan Note (Signed)
 Check CMET, UA ?OAB vs other

## 2024-06-19 NOTE — Assessment & Plan Note (Signed)
 Labs

## 2024-06-19 NOTE — Assessment & Plan Note (Signed)
 On Vit D

## 2024-06-19 NOTE — Progress Notes (Signed)
 Subjective:  Patient ID: [name], female    DOB: 15-May-1986  Age: 38 y.o. MRN: [id number]  CC: Medical Management of Chronic Issues (Frequency in urination )   HPI [name] presents for frequent urination off and on (every hour) Pt had a GYN exam   Outpatient Medications Prior to Visit  Medication Sig Dispense Refill   Cholecalciferol (VITAMIN D3) 50 MCG (2000 UT) capsule Take 2 capsules (4,000 Units total) by mouth daily. 180 capsule 3   Vitamin D , Ergocalciferol , (DRISDOL ) 1.25 MG (50000 UNIT) CAPS capsule Take 1 capsule (50,000 Units total) by mouth every 7 (seven) days. 8 capsule 0   cefdinir  (OMNICEF ) 300 MG capsule Take 1 capsule (300 mg total) by mouth 2 (two) times daily. (Patient not taking: Reported on 04/02/2024) 20 capsule 0   etonogestrel -ethinyl estradiol  (NUVARING) 0.12-0.015 MG/24HR vaginal ring Insert vaginally and leave in place for 3 consecutive weeks, then remove for 1 week. (Patient not taking: Reported on 12/19/2023) 1 each 12   No facility-administered medications prior to visit.    ROS: Review of Systems  Constitutional:  Negative for activity change, appetite change, chills, fatigue and unexpected weight change.  HENT:  Negative for congestion, mouth sores and sinus pressure.   Eyes:  Negative for visual disturbance.  Respiratory:  Negative for cough and chest tightness.   Gastrointestinal:  Negative for abdominal pain and nausea.  Genitourinary:  Positive for frequency. Negative for difficulty urinating, dysuria, flank pain and vaginal pain.  Musculoskeletal:  Negative for back pain and gait problem.  Skin:  Negative for pallor and rash.  Neurological:  Negative for dizziness, tremors, weakness, numbness and headaches.  Psychiatric/Behavioral:  Negative for confusion, sleep disturbance and suicidal ideas.     Objective:  BP 110/68   Pulse 74   Temp 98.5 F (36.9 C) (Oral)   Ht 5' (1.524 m)   Wt 191 lb (86.6 kg)   SpO2 99%   BMI 37.30  kg/m   BP Readings from Last 3 Encounters:  06/19/24 110/68  06/17/24 113/62  05/02/24 119/62    Wt Readings from Last 3 Encounters:  06/19/24 191 lb (86.6 kg)  06/17/24 190 lb 6.4 oz (86.4 kg)  04/02/24 191 lb (86.6 kg)    Physical Exam Constitutional:      General: She is not in acute distress.    Appearance: She is well-developed. She is obese.  HENT:     Head: Normocephalic.  Eyes:     General:        Right eye: No discharge.        Left eye: No discharge.     Conjunctiva/sclera: Conjunctivae normal.     Pupils: Pupils are equal, round, and reactive to light.  Neck:     Thyroid : No thyromegaly.     Vascular: No JVD.     Trachea: No tracheal deviation.  Cardiovascular:     Rate and Rhythm: Normal rate and regular rhythm.     Heart sounds: Normal heart sounds.  Abdominal:     General: Bowel sounds are normal. There is no distension.     Palpations: Abdomen is soft.     Tenderness: There is no abdominal tenderness.  Musculoskeletal:        General: No tenderness.     Cervical back: Normal range of motion.     Left lower leg: No edema.  Skin:    Findings: No erythema or rash.  Neurological:     Cranial Nerves:  No cranial nerve deficit.     Motor: No abnormal muscle tone.     Coordination: Coordination normal.     Deep Tendon Reflexes: Reflexes normal.  Psychiatric:        Behavior: Behavior normal.        Thought Content: Thought content normal.        Judgment: Judgment normal.     Lab Results  Component Value Date   WBC 7.5 06/17/2024   HGB 11.7 (L) 06/17/2024   HCT 36.0 06/17/2024   PLT 317 06/17/2024   GLUCOSE 84 12/29/2023   CHOL 143 12/17/2021   TRIG 41.0 12/17/2021   HDL 45.30 12/17/2021   LDLCALC 90 12/17/2021   ALT 8 12/29/2023   AST 12 12/29/2023   NA 138 12/29/2023   K 3.9 12/29/2023   CL 104 12/29/2023   CREATININE 0.76 12/29/2023   BUN 9 12/29/2023   CO2 27 12/29/2023   TSH 2.650 08/15/2023   HGBA1C 5.5 08/10/2023    US   PELVIC COMPLETE WITH TRANSVAGINAL Result Date: 03/19/2024 CLINICAL DATA:  Follow-up previous exam.  Left ovarian cyst. EXAM: TRANSABDOMINAL AND TRANSVAGINAL ULTRASOUND OF PELVIS TECHNIQUE: Both transabdominal and transvaginal ultrasound examinations of the pelvis were performed. Transabdominal technique was performed for global imaging of the pelvis including uterus, ovaries, adnexal regions, and pelvic cul-de-sac. It was necessary to proceed with endovaginal exam following the transabdominal exam to visualize the uterus, endometrium and ovaries to better advantage. COMPARISON:  Ultrasounds dated 12/04/2023 and 09/01/2023. FINDINGS: Uterus Measurements: 8.6 x 4.3 x 5.6 cm = volume: 109.4 mL. Two small hypoechoic fibroids, both neural, right mid uterine segment, 1.1 x 0.8 x 1.2 cm. Right fundus, 1.9 x 1.8 x 1.8 cm. No other uterine masses. Endometrium Thickness: 10 mm.  No focal abnormality visualized. Right ovary Measurements: 4.0 x 2.2 x 2.6 cm = volume: 12 mL. Thick-walled complicated cyst measuring 2.0 x 1.8 x 1.6 cm, similar in size to the prior ultrasound, but more cystic in appearance. Ovary otherwise unremarkable. No adnexal masses. Left ovary Measurements: 2.5 x 1.4 x 2.3 cm = volume: 4.3 mL. Focal echogenic lesion, 1.2 x 1.0 x 1.1 cm, unchanged from the prior exam. No adnexal masses. Other findings No abnormal free fluid. IMPRESSION: 1. No acute findings. 2. Stable, 1.2 cm hyperechoic left ovarian lesion consistent with a dermoid. 3. Complicated 2 cm right ovarian cyst, stable in size from the prior ultrasound, but more cystic in appearance consistent with a benign etiology. No additional follow-up recommended. 4. 2 uterine fibroids as detailed. Electronically Signed   By: Alm Parkins M.D.   On: 03/19/2024 13:59    Assessment & Plan:   Problem List Items Addressed This Visit     Iron  deficiency anemia due to chronic blood loss   Labs       Urinary frequency   Check CMET, UA ?OAB vs other       Relevant Orders   Comprehensive metabolic panel with GFR   Urinalysis   Vitamin D  deficiency - Primary   On Vit D         No orders of the defined types were placed in this encounter.     Follow-up: Return in about 3 months (around 09/19/2024) for a follow-up visit.  Marolyn Noel, MD

## 2024-06-20 ENCOUNTER — Ambulatory Visit: Payer: Self-pay | Admitting: Medical Oncology

## 2024-06-23 ENCOUNTER — Ambulatory Visit: Payer: Self-pay | Admitting: Internal Medicine

## 2024-06-23 DIAGNOSIS — N3090 Cystitis, unspecified without hematuria: Secondary | ICD-10-CM

## 2024-06-23 MED ORDER — CEPHALEXIN 500 MG PO CAPS: 1000.0000 mg | ORAL_CAPSULE | Freq: Two times a day (BID) | ORAL | Status: DC

## 2024-06-27 ENCOUNTER — Telehealth: Payer: Self-pay

## 2024-06-27 NOTE — Telephone Encounter (Signed)
 Copied from CRM [id number]. Topic: Clinical - Prescription Issue >> Jun 27, 2024  4:06 PM [name] wrote: Reason for CRM: Patient called in regarding cephALEXin  (KEFLEX ) 500 MG capsule , stated the pharmacy has still not received it , would like for someone to get that sent so she can pick it up to start taking

## 2024-06-28 MED ORDER — CEPHALEXIN 500 MG PO CAPS: 1000.0000 mg | ORAL_CAPSULE | Freq: Two times a day (BID) | ORAL | 1 refills | Status: AC

## 2024-06-28 NOTE — Telephone Encounter (Signed)
Rx has been resent to pts pharmacy.

## 2024-06-28 NOTE — Addendum Note (Signed)
 Addended by: HEDDY IP R on: 06/28/2024 09:36 AM   Modules accepted: Orders

## 2024-07-03 ENCOUNTER — Other Ambulatory Visit: Payer: Self-pay | Admitting: Internal Medicine

## 2024-07-04 ENCOUNTER — Inpatient Hospital Stay

## 2024-07-04 VITALS — BP 109/59 | HR 78 | Temp 98.8°F | Resp 18

## 2024-07-04 DIAGNOSIS — N920 Excessive and frequent menstruation with regular cycle: Secondary | ICD-10-CM

## 2024-07-04 DIAGNOSIS — D500 Iron deficiency anemia secondary to blood loss (chronic): Secondary | ICD-10-CM

## 2024-07-04 DIAGNOSIS — Z79899 Other long term (current) drug therapy: Secondary | ICD-10-CM | POA: Diagnosis not present

## 2024-07-04 DIAGNOSIS — D259 Leiomyoma of uterus, unspecified: Secondary | ICD-10-CM | POA: Diagnosis not present

## 2024-07-04 MED ORDER — IRON SUCROSE 300 MG IVPB - SIMPLE MED
300.0000 mg | Freq: Once | Status: AC
  Administered 2024-07-04: 300 mg via INTRAVENOUS
  Filled 2024-07-04: qty 300

## 2024-07-04 MED ORDER — SODIUM CHLORIDE 0.9 % IV SOLN: INTRAVENOUS | Status: DC

## 2024-07-04 NOTE — Patient Instructions (Signed)

## 2024-07-25 ENCOUNTER — Other Ambulatory Visit (INDEPENDENT_AMBULATORY_CARE_PROVIDER_SITE_OTHER)

## 2024-07-25 DIAGNOSIS — N3090 Cystitis, unspecified without hematuria: Secondary | ICD-10-CM

## 2024-07-25 LAB — URINALYSIS
Bilirubin Urine: NEGATIVE
Hgb urine dipstick: NEGATIVE
Ketones, ur: NEGATIVE
Leukocytes,Ua: NEGATIVE
Nitrite: NEGATIVE
Specific Gravity, Urine: 1.01 (ref 1.000–1.030)
Total Protein, Urine: NEGATIVE
Urine Glucose: NEGATIVE
Urobilinogen, UA: 0.2 (ref 0.0–1.0)
pH: 6.5 (ref 5.0–8.0)

## 2024-07-27 LAB — CULTURE, URINE COMPREHENSIVE

## 2024-07-29 ENCOUNTER — Ambulatory Visit: Payer: Self-pay | Admitting: Internal Medicine

## 2024-08-01 ENCOUNTER — Ambulatory Visit: Admitting: Internal Medicine

## 2024-08-01 ENCOUNTER — Encounter: Payer: Self-pay | Admitting: Internal Medicine

## 2024-08-01 VITALS — BP 138/82 | HR 91 | Resp 18 | Ht 60.0 in | Wt 188.2 lb

## 2024-08-01 DIAGNOSIS — L538 Other specified erythematous conditions: Secondary | ICD-10-CM | POA: Diagnosis not present

## 2024-08-01 DIAGNOSIS — D500 Iron deficiency anemia secondary to blood loss (chronic): Secondary | ICD-10-CM

## 2024-08-01 LAB — URINALYSIS, ROUTINE W REFLEX MICROSCOPIC
Ketones, ur: >=80 — AB
Leukocytes,Ua: NEGATIVE
Nitrite: NEGATIVE
Specific Gravity, Urine: >=1.03 — AB (ref 1.000–1.030)
Total Protein, Urine: NEGATIVE
Urine Glucose: NEGATIVE
Urobilinogen, UA: 0.2 (ref 0.0–1.0)
pH: 6 (ref 5.0–8.0)

## 2024-08-01 LAB — COMPREHENSIVE METABOLIC PANEL WITH GFR
ALT: 9 U/L (ref 0–35)
AST: 13 U/L (ref 0–37)
Albumin: 4.6 g/dL (ref 3.5–5.2)
Alkaline Phosphatase: 57 U/L (ref 39–117)
BUN: 12 mg/dL (ref 6–23)
CO2: 24 meq/L (ref 19–32)
Calcium: 9.7 mg/dL (ref 8.4–10.5)
Chloride: 102 meq/L (ref 96–112)
Creatinine, Ser: 0.73 mg/dL (ref 0.40–1.20)
GFR: 105.32 mL/min (ref >60.00)
Glucose, Bld: 91 mg/dL (ref 70–99)
Potassium: 3.6 meq/L (ref 3.5–5.1)
Sodium: 137 meq/L (ref 135–145)
Total Bilirubin: 0.4 mg/dL (ref 0.2–1.2)
Total Protein: 7.9 g/dL (ref 6.0–8.3)

## 2024-08-01 LAB — CBC WITH DIFFERENTIAL/PLATELET
Basophils Absolute: 0 K/uL (ref 0.0–0.1)
Basophils Relative: 0.5 % (ref 0.0–3.0)
Eosinophils Absolute: 0.2 K/uL (ref 0.0–0.7)
Eosinophils Relative: 2.2 % (ref 0.0–5.0)
HCT: 38.4 % (ref 36.0–46.0)
Hemoglobin: 12.5 g/dL (ref 12.0–15.0)
Lymphocytes Relative: 33.6 % (ref 12.0–46.0)
Lymphs Abs: 2.4 K/uL (ref 0.7–4.0)
MCHC: 32.7 g/dL (ref 30.0–36.0)
MCV: 85.1 fl (ref 78.0–100.0)
Monocytes Absolute: 0.5 K/uL (ref 0.1–1.0)
Monocytes Relative: 7.3 % (ref 3.0–12.0)
Neutro Abs: 3.9 K/uL (ref 1.4–7.7)
Neutrophils Relative %: 56.4 % (ref 43.0–77.0)
Platelets: 302 K/uL (ref 150.0–400.0)
RBC: 4.51 Mil/uL (ref 3.87–5.11)
RDW: 14.6 % (ref 11.5–15.5)
WBC: 7 K/uL (ref 4.0–10.5)

## 2024-08-01 LAB — SEDIMENTATION RATE: Sed Rate: 19 mm/h (ref 0–20)

## 2024-08-01 LAB — PROTIME-INR
INR: 1.2 ratio — ABNORMAL HIGH (ref 0.8–1.0)
Prothrombin Time: 12.2 s (ref 9.6–13.1)

## 2024-08-01 LAB — C-REACTIVE PROTEIN: CRP: 1.6 mg/dL (ref 0.5–20.0)

## 2024-08-01 NOTE — Assessment & Plan Note (Addendum)
 New purple flat spots 1-4 cm in diameter on B lower legs since Feb 2025. No change in appearance. No pain, fever., itching. Probable skin vasculitis of ?etiology.  Livedo reticularis type of rash Dermatology referral (Recent iron  infusion - 06/2024) Take Vit B complex with C Use Arnica cream

## 2024-08-01 NOTE — Assessment & Plan Note (Signed)
 Labs  S/p recent infusion of iron  - 06/2024

## 2024-08-01 NOTE — Patient Instructions (Addendum)
 Take Vit B complex with C Use Arnica cream  Boiron Arnicare Cream for Joint Pain, Muscle Pain, Swelling, Soreness, Stiffness, and Bruises - Fast Absorbing and Fragrance-Free - 4.2 oz

## 2024-08-01 NOTE — Progress Notes (Signed)
 Subjective:  Patient ID: [name], female    DOB: 12/09/1985  Age: 38 y.o. MRN: [id number]  CC: Bleeding/Bruising (Red spots or petechiae on bilateral legs and ankles. Began on left leg in February 2025 and right leg in June. No pain, itching or swelling noted. )   HPIBleeding/Bruising - Began on left leg in February 2025 and right leg in June. No pain, itching or swelling noted.  [name] presents for rash -       Outpatient Medications Prior to Visit  Medication Sig Dispense Refill   Vitamin D , Ergocalciferol , (DRISDOL ) 1.25 MG (50000 UNIT) CAPS capsule TAKE 1 CAPSULE (50,000 UNITS TOTAL) BY MOUTH EVERY 7 (SEVEN) DAYS 8 capsule 0   cephALEXin  (KEFLEX ) 500 MG capsule Take 2 capsules (1,000 mg total) by mouth 2 (two) times daily. 20 capsule 1   Cholecalciferol (VITAMIN D3) 50 MCG (2000 UT) capsule Take 2 capsules (4,000 Units total) by mouth daily. (Patient not taking: Reported on 08/01/2024) 180 capsule 3   No facility-administered medications prior to visit.    ROS: Review of Systems  Constitutional:  Negative for activity change, appetite change, chills, fatigue and unexpected weight change.  HENT:  Negative for congestion, mouth sores and sinus pressure.   Eyes:  Negative for visual disturbance.  Respiratory:  Negative for cough and chest tightness.   Gastrointestinal:  Negative for abdominal pain and nausea.  Genitourinary:  Negative for difficulty urinating, frequency and vaginal pain.  Musculoskeletal:  Negative for back pain and gait problem.  Skin:  Positive for color change and rash. Negative for pallor.  Neurological:  Negative for dizziness, tremors, weakness, numbness and headaches.  Psychiatric/Behavioral:  Negative for confusion and sleep disturbance.     Objective:  BP 138/82 (BP Location: Left Arm, Patient Position: Sitting)   Pulse 91   Resp 18   Ht 5' (1.524 m)   Wt 188 lb 3.2 oz (85.4 kg)   SpO2 98%   BMI 36.76 kg/m   BP Readings from Last  3 Encounters:  08/03/24 108/72  08/01/24 138/82  07/04/24 (!) 109/59    Wt Readings from Last 3 Encounters:  08/01/24 188 lb 3.2 oz (85.4 kg)  06/19/24 191 lb (86.6 kg)  06/17/24 190 lb 6.4 oz (86.4 kg)    Physical Exam Constitutional:      General: She is not in acute distress.    Appearance: She is well-developed. She is obese.  HENT:     Head: Normocephalic.     Right Ear: External ear normal.     Left Ear: External ear normal.     Nose: Nose normal.  Eyes:     General:        Right eye: No discharge.        Left eye: No discharge.     Conjunctiva/sclera: Conjunctivae normal.     Pupils: Pupils are equal, round, and reactive to light.  Neck:     Thyroid : No thyromegaly.     Vascular: No JVD.     Trachea: No tracheal deviation.  Cardiovascular:     Rate and Rhythm: Normal rate and regular rhythm.     Heart sounds: Normal heart sounds.  Pulmonary:     Effort: No respiratory distress.     Breath sounds: No stridor. No wheezing.  Abdominal:     General: Bowel sounds are normal. There is no distension.     Palpations: Abdomen is soft. There is no mass.  Tenderness: There is no abdominal tenderness. There is no guarding or rebound.  Musculoskeletal:        General: No tenderness.     Cervical back: Normal range of motion and neck supple. No rigidity.  Lymphadenopathy:     Cervical: No cervical adenopathy.  Skin:    Findings: Lesion and rash present. No bruising or erythema.  Neurological:     Cranial Nerves: No cranial nerve deficit.     Motor: No abnormal muscle tone.     Coordination: Coordination normal.     Deep Tendon Reflexes: Reflexes normal.  Psychiatric:        Behavior: Behavior normal.        Thought Content: Thought content normal.        Judgment: Judgment normal.    Livedo reticularis type of maculas on B lower legs  New purple flat spots 1-4 cm in diameter on B lower legs since Feb 2025. No change in appearance. No pain, fever., itching. No  blanching   Lab Results  Component Value Date   WBC 7.0 08/01/2024   HGB 12.5 08/01/2024   HCT 38.4 08/01/2024   PLT 302.0 08/01/2024   GLUCOSE 91 08/01/2024   CHOL 143 12/17/2021   TRIG 41.0 12/17/2021   HDL 45.30 12/17/2021   LDLCALC 90 12/17/2021   ALT 9 08/01/2024   AST 13 08/01/2024   NA 137 08/01/2024   K 3.6 08/01/2024   CL 102 08/01/2024   CREATININE 0.73 08/01/2024   BUN 12 08/01/2024   CO2 24 08/01/2024   TSH 2.650 08/15/2023   INR 1.2 (H) 08/01/2024   HGBA1C 5.5 08/10/2023    US  PELVIC COMPLETE WITH TRANSVAGINAL Result Date: 03/19/2024 CLINICAL DATA:  Follow-up previous exam.  Left ovarian cyst. EXAM: TRANSABDOMINAL AND TRANSVAGINAL ULTRASOUND OF PELVIS TECHNIQUE: Both transabdominal and transvaginal ultrasound examinations of the pelvis were performed. Transabdominal technique was performed for global imaging of the pelvis including uterus, ovaries, adnexal regions, and pelvic cul-de-sac. It was necessary to proceed with endovaginal exam following the transabdominal exam to visualize the uterus, endometrium and ovaries to better advantage. COMPARISON:  Ultrasounds dated 12/04/2023 and 09/01/2023. FINDINGS: Uterus Measurements: 8.6 x 4.3 x 5.6 cm = volume: 109.4 mL. Two small hypoechoic fibroids, both neural, right mid uterine segment, 1.1 x 0.8 x 1.2 cm. Right fundus, 1.9 x 1.8 x 1.8 cm. No other uterine masses. Endometrium Thickness: 10 mm.  No focal abnormality visualized. Right ovary Measurements: 4.0 x 2.2 x 2.6 cm = volume: 12 mL. Thick-walled complicated cyst measuring 2.0 x 1.8 x 1.6 cm, similar in size to the prior ultrasound, but more cystic in appearance. Ovary otherwise unremarkable. No adnexal masses. Left ovary Measurements: 2.5 x 1.4 x 2.3 cm = volume: 4.3 mL. Focal echogenic lesion, 1.2 x 1.0 x 1.1 cm, unchanged from the prior exam. No adnexal masses. Other findings No abnormal free fluid. IMPRESSION: 1. No acute findings. 2. Stable, 1.2 cm hyperechoic left  ovarian lesion consistent with a dermoid. 3. Complicated 2 cm right ovarian cyst, stable in size from the prior ultrasound, but more cystic in appearance consistent with a benign etiology. No additional follow-up recommended. 4. 2 uterine fibroids as detailed. Electronically Signed   By: Alm Parkins M.D.   On: 03/19/2024 13:59    Assessment & Plan:   Problem List Items Addressed This Visit     Iron  deficiency anemia due to chronic blood loss   Labs  S/p recent infusion of iron  - 06/2024  Relevant Orders   CBC with Differential/Platelet (Completed)   Macular erythematous rash - Primary   New purple flat spots 1-4 cm in diameter on B lower legs since Feb 2025. No change in appearance. No pain, fever., itching. Probable skin vasculitis of ?etiology.  Livedo reticularis type of rash Dermatology referral (Recent iron  infusion - 06/2024) Take Vit B complex with C Use Arnica cream      Relevant Orders   Ambulatory referral to Dermatology   CBC with Differential/Platelet (Completed)   Comprehensive metabolic panel with GFR (Completed)   Urinalysis   Sedimentation rate (Completed)   Protime-INR (Completed)   C-reactive protein (Completed)      No orders of the defined types were placed in this encounter.     Follow-up: Return in about 6 weeks (around 09/12/2024) for a follow-up visit.  Marolyn Noel, MD

## 2024-08-02 ENCOUNTER — Ambulatory Visit: Payer: Self-pay | Admitting: Internal Medicine

## 2024-08-03 NOTE — Progress Notes (Signed)
 This pt attended 08/03/2024 screening event and BP was 108/72. Pt did not indicate any SDOH needs.  Further f/u to be scheduled per health equity protocol.

## 2024-08-05 ENCOUNTER — Encounter: Payer: Self-pay | Admitting: Internal Medicine

## 2024-08-18 DIAGNOSIS — Z419 Encounter for procedure for purposes other than remedying health state, unspecified: Secondary | ICD-10-CM | POA: Diagnosis not present

## 2024-08-21 DIAGNOSIS — I83813 Varicose veins of bilateral lower extremities with pain: Secondary | ICD-10-CM | POA: Diagnosis not present

## 2024-08-21 DIAGNOSIS — I872 Venous insufficiency (chronic) (peripheral): Secondary | ICD-10-CM | POA: Diagnosis not present

## 2024-09-09 ENCOUNTER — Encounter: Payer: Self-pay | Admitting: Internal Medicine

## 2024-10-08 NOTE — Progress Notes (Signed)
 The patient attended a screening event on 08/03/24 where her screening results were 108/72 bp. At the event the patient noted she doesn't smoke, has insurance, and a pcp. Patient did not indicate any SDOH needs. Per chart review pt does have a pcp and the last ov with pcp was 08/01/24. Chart review also indicates a few future specialty appts. No additional Health equity team support indicated at this time.

## 2024-10-24 DIAGNOSIS — R590 Localized enlarged lymph nodes: Secondary | ICD-10-CM | POA: Diagnosis not present

## 2024-11-07 ENCOUNTER — Encounter: Payer: Self-pay | Admitting: Hematology & Oncology

## 2024-11-14 ENCOUNTER — Ambulatory Visit: Admitting: Internal Medicine

## 2024-11-14 ENCOUNTER — Ambulatory Visit: Payer: Self-pay | Admitting: Internal Medicine

## 2024-11-14 ENCOUNTER — Ambulatory Visit (INDEPENDENT_AMBULATORY_CARE_PROVIDER_SITE_OTHER)

## 2024-11-14 VITALS — BP 106/78 | HR 89 | Temp 98.0°F | Ht 60.0 in | Wt 197.0 lb

## 2024-11-14 DIAGNOSIS — M79672 Pain in left foot: Secondary | ICD-10-CM

## 2024-11-14 DIAGNOSIS — R5383 Other fatigue: Secondary | ICD-10-CM

## 2024-11-14 DIAGNOSIS — E559 Vitamin D deficiency, unspecified: Secondary | ICD-10-CM | POA: Diagnosis not present

## 2024-11-14 DIAGNOSIS — M79601 Pain in right arm: Secondary | ICD-10-CM

## 2024-11-14 DIAGNOSIS — D500 Iron deficiency anemia secondary to blood loss (chronic): Secondary | ICD-10-CM | POA: Diagnosis not present

## 2024-11-14 LAB — CBC WITH DIFFERENTIAL/PLATELET
Basophils Absolute: 0 K/uL (ref 0.0–0.1)
Basophils Relative: 0.6 % (ref 0.0–3.0)
Eosinophils Absolute: 0.3 K/uL (ref 0.0–0.7)
Eosinophils Relative: 3.6 % (ref 0.0–5.0)
HCT: 34.5 % — ABNORMAL LOW (ref 36.0–46.0)
Hemoglobin: 11.3 g/dL — ABNORMAL LOW (ref 12.0–15.0)
Lymphocytes Relative: 38.7 % (ref 12.0–46.0)
Lymphs Abs: 3.2 K/uL (ref 0.7–4.0)
MCHC: 32.6 g/dL (ref 30.0–36.0)
MCV: 84.7 fl (ref 78.0–100.0)
Monocytes Absolute: 0.6 K/uL (ref 0.1–1.0)
Monocytes Relative: 7.9 % (ref 3.0–12.0)
Neutro Abs: 4 K/uL (ref 1.4–7.7)
Neutrophils Relative %: 49.2 % (ref 43.0–77.0)
Platelets: 331 K/uL (ref 150.0–400.0)
RBC: 4.08 Mil/uL (ref 3.87–5.11)
RDW: 13.6 % (ref 11.5–15.5)
WBC: 8.1 K/uL (ref 4.0–10.5)

## 2024-11-14 LAB — URINALYSIS, ROUTINE W REFLEX MICROSCOPIC
Bilirubin Urine: NEGATIVE
Ketones, ur: NEGATIVE
Leukocytes,Ua: NEGATIVE
Nitrite: NEGATIVE
Specific Gravity, Urine: >=1.03 — AB (ref 1.000–1.030)
Total Protein, Urine: NEGATIVE
Urine Glucose: NEGATIVE
Urobilinogen, UA: 0.2 (ref 0.0–1.0)
pH: 6 (ref 5.0–8.0)

## 2024-11-14 LAB — COMPREHENSIVE METABOLIC PANEL WITH GFR
ALT: 8 U/L (ref 3–35)
AST: 12 U/L (ref 5–37)
Albumin: 3.8 g/dL (ref 3.5–5.2)
Alkaline Phosphatase: 52 U/L (ref 39–117)
BUN: 8 mg/dL (ref 6–23)
CO2: 26 meq/L (ref 19–32)
Calcium: 8.8 mg/dL (ref 8.4–10.5)
Chloride: 105 meq/L (ref 96–112)
Creatinine, Ser: 0.63 mg/dL (ref 0.40–1.20)
GFR: 113.38 mL/min
Glucose, Bld: 100 mg/dL — ABNORMAL HIGH (ref 70–99)
Potassium: 3.7 meq/L (ref 3.5–5.1)
Sodium: 137 meq/L (ref 135–145)
Total Bilirubin: 0.3 mg/dL (ref 0.2–1.2)
Total Protein: 6.7 g/dL (ref 6.0–8.3)

## 2024-11-14 LAB — T4, FREE: Free T4: 0.87 ng/dL (ref 0.60–1.60)

## 2024-11-14 LAB — SEDIMENTATION RATE: Sed Rate: 18 mm/h (ref 0–20)

## 2024-11-14 LAB — VITAMIN B12: Vitamin B-12: 235 pg/mL (ref 211–911)

## 2024-11-14 LAB — TSH: TSH: 3.33 u[IU]/mL (ref 0.35–5.50)

## 2024-11-14 LAB — VITAMIN D 25 HYDROXY (VIT D DEFICIENCY, FRACTURES): VITD: 12.55 ng/mL — ABNORMAL LOW (ref 30.00–100.00)

## 2024-11-14 MED ORDER — VITAMIN D (ERGOCALCIFEROL) 1.25 MG (50000 UNIT) PO CAPS: 50000.0000 [IU] | ORAL_CAPSULE | ORAL | 1 refills | Status: AC

## 2024-11-14 MED ORDER — B COMPLEX-C PO TABS: 1.0000 | ORAL_TABLET | Freq: Every day | ORAL | 3 refills | Status: AC

## 2024-11-14 MED ORDER — NAPROXEN 500 MG PO TABS: 500.0000 mg | ORAL_TABLET | Freq: Two times a day (BID) | ORAL | 2 refills | Status: AC | PRN

## 2024-11-14 NOTE — Assessment & Plan Note (Signed)
 New H/o R wrist fx RF, ESR
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
## 2024-11-14 NOTE — Assessment & Plan Note (Signed)
 On iron infusions Check CBC, iron

## 2024-11-14 NOTE — Assessment & Plan Note (Signed)
On Vit D Check Vit D 

## 2024-11-14 NOTE — Assessment & Plan Note (Addendum)
 Unclear etiology.  Use good shoes.  Obtain foot x ray.  Obtain rheumatoid test.  Naproxen  prescribed Take with food as needed

## 2024-11-15 LAB — IRON,TIBC AND FERRITIN PANEL
%SAT: 12 % — ABNORMAL LOW (ref 16–45)
Ferritin: 65 ng/mL (ref 16–154)
Iron: 34 ug/dL — ABNORMAL LOW (ref 40–190)
TIBC: 293 ug/dL (ref 250–450)

## 2024-11-15 LAB — RHEUMATOID FACTOR: Rheumatoid fact SerPl-aCnc: <10 [IU]/mL

## 2024-11-27 ENCOUNTER — Encounter: Payer: Self-pay | Admitting: Hematology & Oncology

## 2024-12-01 NOTE — Assessment & Plan Note (Addendum)
 She is tired again.  Obtain CBC, other test

## 2024-12-12 ENCOUNTER — Encounter: Payer: Self-pay | Admitting: Hematology & Oncology

## 2024-12-18 ENCOUNTER — Inpatient Hospital Stay

## 2024-12-18 ENCOUNTER — Ambulatory Visit: Admitting: Medical Oncology

## 2025-03-05 ENCOUNTER — Ambulatory Visit: Admitting: Dermatology
